# Patient Record
Sex: Female | Born: 1935
Health system: Southern US, Community
[De-identification: ages and names within clinical notes are randomized; demographics above are authoritative.]

## PROBLEM LIST (undated history)

## (undated) DIAGNOSIS — D649 Anemia, unspecified: Secondary | ICD-10-CM

## (undated) DIAGNOSIS — M7989 Other specified soft tissue disorders: Secondary | ICD-10-CM

## (undated) DIAGNOSIS — I351 Nonrheumatic aortic (valve) insufficiency: Secondary | ICD-10-CM

## (undated) DIAGNOSIS — E039 Hypothyroidism, unspecified: Secondary | ICD-10-CM

## (undated) DIAGNOSIS — M1990 Unspecified osteoarthritis, unspecified site: Secondary | ICD-10-CM

## (undated) DIAGNOSIS — E785 Hyperlipidemia, unspecified: Secondary | ICD-10-CM

## (undated) DIAGNOSIS — K5792 Diverticulitis of intestine, part unspecified, without perforation or abscess without bleeding: Secondary | ICD-10-CM

## (undated) DIAGNOSIS — K219 Gastro-esophageal reflux disease without esophagitis: Secondary | ICD-10-CM

## (undated) HISTORY — DX: Hypothyroidism, unspecified: E03.9

## (undated) HISTORY — DX: Hyperlipidemia, unspecified: E78.5

## (undated) HISTORY — DX: Other specified soft tissue disorders: M79.89

## (undated) HISTORY — DX: Gastro-esophageal reflux disease without esophagitis: K21.9

## (undated) HISTORY — PX: NO PAST SURGERIES: SHX2092

## (undated) HISTORY — PX: EYE SURGERY: SHX253

## (undated) HISTORY — DX: Diverticulitis of intestine, part unspecified, without perforation or abscess without bleeding: K57.92

## (undated) HISTORY — DX: Unspecified osteoarthritis, unspecified site: M19.90

---

## 2010-01-31 LAB — HM COLONOSCOPY: HM COLON: NORMAL

## 2010-06-01 ENCOUNTER — Inpatient Hospital Stay (INDEPENDENT_AMBULATORY_CARE_PROVIDER_SITE_OTHER)
Admission: RE | Admit: 2010-06-01 | Discharge: 2010-06-01 | Disposition: A | Discharge status: Home / Self Care | Payer: Medicare Other | Source: Ambulatory Visit | Attending: Emergency Medicine | Admitting: Emergency Medicine

## 2010-06-01 DIAGNOSIS — S139XXA Sprain of joints and ligaments of unspecified parts of neck, initial encounter: Secondary | ICD-10-CM

## 2010-06-07 ENCOUNTER — Encounter: Payer: Self-pay | Admitting: Family Medicine

## 2010-06-07 ENCOUNTER — Ambulatory Visit (INDEPENDENT_AMBULATORY_CARE_PROVIDER_SITE_OTHER): Payer: Medicare Other | Admitting: Family Medicine

## 2010-06-07 DIAGNOSIS — K219 Gastro-esophageal reflux disease without esophagitis: Secondary | ICD-10-CM

## 2010-06-07 DIAGNOSIS — E785 Hyperlipidemia, unspecified: Secondary | ICD-10-CM

## 2010-06-07 DIAGNOSIS — M810 Age-related osteoporosis without current pathological fracture: Secondary | ICD-10-CM

## 2010-06-07 DIAGNOSIS — M62838 Other muscle spasm: Secondary | ICD-10-CM

## 2010-06-07 NOTE — Progress Notes (Signed)
  Subjective:    Patient ID: Tonya Johns, female    DOB: 20-Jul-1930, 77 y.o.   MRN: [id number]  HPI New to establish care.  Moved from [place] 1 week ago.  Daughter lives locally.  Reports last CPE was may or June of 2011.  Neck strain- pt spent Monday moving and on Tuesday had severe neck pain.  Went to UC and was dx'd w/ cervical strain.  Started on NSAIDs, muscle relaxer and pain medicine.  Pt reports sxs are much improved.  GERD- was taking Prilosec as needed, sxs are well controlled w/ attention to diet.  Hyperlipidemia- pt reports she was tried on 3-4 meds w/out improvement in #s.  Not currently on meds.  Osteoporosis- was previously on Fosamax.  This was discontinued due to problems w/ teeth.  Was to be set up w/ Reclast infusion.  Had Dexa last year.  Review of Systems For ROS see HPI     Objective:   Physical Exam  Constitutional: She is oriented to person, place, and time. She appears well-developed and well-nourished. No distress.  HENT:  Head: Normocephalic and atraumatic.  Eyes: Conjunctivae and EOM are normal. Pupils are equal, round, and reactive to light.  Neck: No thyromegaly present.       + trap spasm bilaterally, good ROM  Cardiovascular: Normal rate, regular rhythm, normal heart sounds and intact distal pulses.   Pulmonary/Chest: Effort normal and breath sounds normal. No respiratory distress. She has no wheezes.  Lymphadenopathy:    She has no cervical adenopathy.  Neurological: She is alert and oriented to person, place, and time. No cranial nerve deficit.  Skin: Skin is warm and dry.          Assessment & Plan:

## 2010-06-07 NOTE — Patient Instructions (Signed)
Please schedule your complete physical in the next 4-6 weeks, don't eat before this appt Continue to take the muscle relaxer (cyclobenzaprine) for your neck stiffness Add a heating pad for pain relief Call with any questions or concerns Welcome!  We're glad to have you!!!

## 2010-06-15 DIAGNOSIS — E785 Hyperlipidemia, unspecified: Secondary | ICD-10-CM | POA: Insufficient documentation

## 2010-06-15 DIAGNOSIS — K219 Gastro-esophageal reflux disease without esophagitis: Secondary | ICD-10-CM | POA: Insufficient documentation

## 2010-06-15 DIAGNOSIS — M62838 Other muscle spasm: Secondary | ICD-10-CM | POA: Insufficient documentation

## 2010-06-15 DIAGNOSIS — M810 Age-related osteoporosis without current pathological fracture: Secondary | ICD-10-CM | POA: Insufficient documentation

## 2010-06-15 NOTE — Assessment & Plan Note (Signed)
Uncertain of pt's most recent cholesterol.  Due for CPE in June.  Will check labs at that time and determine what meds, if any, pt needs.  Pt expressed understanding and is in agreement w/ plan.

## 2010-06-15 NOTE — Assessment & Plan Note (Signed)
Well controlled w/ attention to diet and use of PPI prn.  Will follow.

## 2010-06-15 NOTE — Assessment & Plan Note (Signed)
Most likely due to strain of moving.  Continue muscle relaxer given in ER.  Heating pad prn.  Reviewed supportive care and red flags that should prompt return.  Pt expressed understanding and is in agreement w/ plan.

## 2010-06-15 NOTE — Assessment & Plan Note (Signed)
Pt reports she had DEXA last yr and was to have reclast.  Will attempt to get records prior to CPE so that we can set up for Reclast infusion.

## 2010-07-14 ENCOUNTER — Ambulatory Visit (INDEPENDENT_AMBULATORY_CARE_PROVIDER_SITE_OTHER): Payer: Medicare Other | Admitting: Family Medicine

## 2010-07-14 ENCOUNTER — Encounter: Payer: Self-pay | Admitting: Family Medicine

## 2010-07-14 DIAGNOSIS — M810 Age-related osteoporosis without current pathological fracture: Secondary | ICD-10-CM

## 2010-07-14 DIAGNOSIS — E049 Nontoxic goiter, unspecified: Secondary | ICD-10-CM

## 2010-07-14 DIAGNOSIS — E785 Hyperlipidemia, unspecified: Secondary | ICD-10-CM

## 2010-07-14 DIAGNOSIS — Z0000 Encounter for general adult medical examination without abnormal findings: Secondary | ICD-10-CM

## 2010-07-14 DIAGNOSIS — E010 Iodine-deficiency related diffuse (endemic) goiter: Secondary | ICD-10-CM

## 2010-07-14 LAB — LDL CHOLESTEROL, DIRECT: Direct LDL: 241.4 mg/dL

## 2010-07-14 LAB — CBC WITH DIFFERENTIAL/PLATELET
Eosinophils Relative: 1.3 % (ref 0.0–5.0)
Monocytes Absolute: 0.5 10*3/uL (ref 0.1–1.0)
Monocytes Relative: 7.2 % (ref 3.0–12.0)
Neutrophils Relative %: 58.5 % (ref 43.0–77.0)
Platelets: 241 10*3/uL (ref 150.0–400.0)
WBC: 6.3 10*3/uL (ref 4.5–10.5)

## 2010-07-14 LAB — LIPID PANEL
HDL: 63.3 mg/dL (ref >39.00)
Total CHOL/HDL Ratio: 5
Triglycerides: 101 mg/dL (ref 0.0–149.0)
VLDL: 20.2 mg/dL (ref 0.0–40.0)

## 2010-07-14 LAB — HEPATIC FUNCTION PANEL
Albumin: 3.3 g/dL — ABNORMAL LOW (ref 3.5–5.2)
Alkaline Phosphatase: 86 U/L (ref 39–117)
Total Protein: 7 g/dL (ref 6.0–8.3)

## 2010-07-14 LAB — BASIC METABOLIC PANEL
BUN: 23 mg/dL (ref 6–23)
CO2: 27 mEq/L (ref 19–32)
Calcium: 8.8 mg/dL (ref 8.4–10.5)
Creatinine, Ser: 1.3 mg/dL — ABNORMAL HIGH (ref 0.4–1.2)
GFR: 52.8 mL/min — ABNORMAL LOW (ref >60.00)
Glucose, Bld: 113 mg/dL — ABNORMAL HIGH (ref 70–99)

## 2010-07-14 LAB — TSH: TSH: 0.92 u[IU]/mL (ref 0.35–5.50)

## 2010-07-14 NOTE — Patient Instructions (Signed)
Your exam looks great!  Keep up the good work! We'll notify you of your lab results and your Reclast info Call with any questions or concerns Have a great summer!

## 2010-07-14 NOTE — Progress Notes (Signed)
  Subjective:    Patient ID: Tonya Johns, female    DOB: January 14, 1931, 77 y.o.   MRN: [id number]  HPI Here today for CPE.  Risk Factors: Hyperlipidemia- chronic problem for pt, reports she was tried on 2 different meds (Simvastatin and Lipitor) and both caused nocturnal cough and 'feeling different'.  sxs improved when she stopped meds. Osteoporosis- chronic problem for pt, was taking Fosamax but had difficulty w/ dental problems.  Previous MD was going to start Reclast. Physical Activity: working in yard regularly w/out difficulty Fall Risk: low risk Depression: denies sxs of depression, retired in April from [employer] Hearing: denies concern, normal to whispered voice at 6 ft ADL's: independent Cognitive: normal linear thought process, no memory deficits. Home Safety: safe at home, lives w/ son Height, Weight, BMI, Visual Acuity: see vitals, vision corrected to 20/20 w/ glasses Counseling: due next year for colonoscopy, UTD on mammo.  Declines pap smear.  Discussed importance of power of attorney, and living will. Labs Ordered: See A&P Care Plan: See A&P    Review of Systems ROS  Patient reports no vision/ hearing changes, adenopathy,fever, weight change,  persistant/recurrent hoarseness , swallowing issues, chest pain, palpitations, edema, persistant/recurrent cough, hemoptysis, dyspnea (rest/exertional/paroxysmal nocturnal), gastrointestinal bleeding (melena, rectal bleeding), abdominal pain, significant heartburn, bowel changes, GU symptoms (dysuria, hematuria, incontinence), Gyn symptoms (abnormal  bleeding, pain),  syncope, focal weakness, memory loss, numbness & tingling, skin/hair/nail changes, abnormal bruising or bleeding, anxiety, or depression.     Objective:   Physical Exam  General Appearance:    Alert, cooperative, no distress, appears stated age  Head:    Normocephalic, without obvious abnormality, atraumatic  Eyes:    PERRL, conjunctiva/corneas  clear, EOM's intact, fundi    benign, both eyes  Ears:    Normal TM's and external ear canals, both ears  Nose:   Nares normal, septum midline, mucosa normal, no drainage    or sinus tenderness  Throat:   Lips, mucosa, and tongue normal; teeth and gums normal  Neck:   Supple, symmetrical, trachea midline, no adenopathy;    Thyroid: diffuse enlargement w/out obvious nodule  Back:     Symmetric, no curvature, ROM normal, no CVA tenderness  Lungs:     Clear to auscultation bilaterally, respirations unlabored  Chest Wall:    No tenderness or deformity   Heart:    Regular rate and rhythm, S1 and S2 normal  Breast Exam:    No tenderness, masses, or nipple abnormality  Abdomen:     Soft, non-tender, bowel sounds active all four quadrants,    no masses, no organomegaly  Genitalia:    Deferred  Rectal:    Deferred  Extremities:   Extremities normal, atraumatic, no cyanosis or edema  Pulses:   2+ and symmetric all extremities  Skin:   Skin color, texture, turgor normal, no rashes or lesions  Lymph nodes:   Cervical, supraclavicular, and axillary nodes normal  Neurologic:   CNII-XII intact, normal strength, sensation and reflexes    throughout          Assessment & Plan:

## 2010-07-27 ENCOUNTER — Telehealth: Payer: Self-pay | Admitting: *Deleted

## 2010-07-27 NOTE — Assessment & Plan Note (Signed)
Pt's PE WNL w/ exception of thyromegaly.  UTD on health maintenance.  Check labs.  Anticipatory guidance provided.

## 2010-07-27 NOTE — Assessment & Plan Note (Signed)
Check labs- determine starting point and pick appropriate med.

## 2010-07-27 NOTE — Telephone Encounter (Signed)
Left message on voicemail to call the office.  **Pt Reclast has been approved and appt is sch for Monday 08/02/10 10AM at [facility].

## 2010-07-27 NOTE — Assessment & Plan Note (Signed)
Check Vit D level and set up Reclast for pt if labs are appropriate.

## 2010-07-27 NOTE — Assessment & Plan Note (Signed)
This is not a new problem for pt.  Was having routine US's to assess.  Will need to review records and determine when f/u is needed.

## 2010-07-28 ENCOUNTER — Encounter: Payer: Self-pay | Admitting: *Deleted

## 2010-07-28 NOTE — Telephone Encounter (Signed)
Pt.notified

## 2010-07-28 NOTE — Telephone Encounter (Signed)
Left message on voicemail to call the office

## 2010-08-02 ENCOUNTER — Other Ambulatory Visit: Payer: Self-pay | Admitting: Family Medicine

## 2010-08-02 ENCOUNTER — Ambulatory Visit (HOSPITAL_COMMUNITY): Payer: Medicare Other | Attending: Family Medicine

## 2010-08-02 DIAGNOSIS — M810 Age-related osteoporosis without current pathological fracture: Secondary | ICD-10-CM | POA: Insufficient documentation

## 2010-08-02 LAB — CREATININE, SERUM: Creatinine, Ser: 1.09 mg/dL (ref 0.50–1.10)

## 2010-09-10 ENCOUNTER — Other Ambulatory Visit: Payer: Self-pay | Admitting: Family Medicine

## 2010-09-10 DIAGNOSIS — Z0000 Encounter for general adult medical examination without abnormal findings: Secondary | ICD-10-CM

## 2010-09-13 ENCOUNTER — Other Ambulatory Visit (INDEPENDENT_AMBULATORY_CARE_PROVIDER_SITE_OTHER): Payer: Medicare Other

## 2010-09-13 DIAGNOSIS — Z0000 Encounter for general adult medical examination without abnormal findings: Secondary | ICD-10-CM

## 2010-09-13 DIAGNOSIS — Z0289 Encounter for other administrative examinations: Secondary | ICD-10-CM

## 2010-09-13 NOTE — Progress Notes (Signed)
Labs only

## 2010-09-21 ENCOUNTER — Other Ambulatory Visit: Payer: Self-pay | Admitting: *Deleted

## 2010-09-21 MED ORDER — PITAVASTATIN CALCIUM 4 MG PO TABS: 1.0000 | ORAL_TABLET | Freq: Once | ORAL | Status: DC

## 2010-09-28 ENCOUNTER — Other Ambulatory Visit: Payer: Self-pay | Admitting: Family Medicine

## 2010-09-28 DIAGNOSIS — E785 Hyperlipidemia, unspecified: Secondary | ICD-10-CM

## 2010-09-29 ENCOUNTER — Telehealth: Payer: Self-pay | Admitting: Family Medicine

## 2010-09-29 ENCOUNTER — Other Ambulatory Visit (INDEPENDENT_AMBULATORY_CARE_PROVIDER_SITE_OTHER): Payer: Medicare Other

## 2010-09-29 ENCOUNTER — Encounter: Payer: Self-pay | Admitting: *Deleted

## 2010-09-29 DIAGNOSIS — E785 Hyperlipidemia, unspecified: Secondary | ICD-10-CM

## 2010-09-29 LAB — HEPATIC FUNCTION PANEL
ALT: 15 U/L (ref 0–35)
AST: 17 U/L (ref 0–37)
Total Bilirubin: 0.4 mg/dL (ref 0.3–1.2)
Total Protein: 7 g/dL (ref 6.0–8.3)

## 2010-09-29 NOTE — Telephone Encounter (Signed)
Discuss with patient  

## 2010-09-29 NOTE — Progress Notes (Signed)
Labs only

## 2010-09-29 NOTE — Telephone Encounter (Signed)
This is not a side effect I'm familiar w/ but it is possible.  The best thing to do would be to stop taking it for a week and see if her symptoms stop.  She should call us back after 1 week and let us know.

## 2010-11-25 ENCOUNTER — Encounter: Payer: Self-pay | Admitting: Family Medicine

## 2010-11-25 ENCOUNTER — Ambulatory Visit (INDEPENDENT_AMBULATORY_CARE_PROVIDER_SITE_OTHER): Payer: Medicare Other | Admitting: Family Medicine

## 2010-11-25 DIAGNOSIS — F329 Major depressive disorder, single episode, unspecified: Secondary | ICD-10-CM

## 2010-11-25 DIAGNOSIS — M25562 Pain in left knee: Secondary | ICD-10-CM | POA: Insufficient documentation

## 2010-11-25 DIAGNOSIS — E0590 Thyrotoxicosis, unspecified without thyrotoxic crisis or storm: Secondary | ICD-10-CM

## 2010-11-25 DIAGNOSIS — M25569 Pain in unspecified knee: Secondary | ICD-10-CM

## 2010-11-25 DIAGNOSIS — Z23 Encounter for immunization: Secondary | ICD-10-CM

## 2010-11-25 DIAGNOSIS — R5383 Other fatigue: Secondary | ICD-10-CM

## 2010-11-25 DIAGNOSIS — R011 Cardiac murmur, unspecified: Secondary | ICD-10-CM

## 2010-11-25 DIAGNOSIS — M25561 Pain in right knee: Secondary | ICD-10-CM

## 2010-11-25 DIAGNOSIS — F32A Depression, unspecified: Secondary | ICD-10-CM | POA: Insufficient documentation

## 2010-11-25 LAB — CBC WITH DIFFERENTIAL/PLATELET
Basophils Relative: 0.3 % (ref 0.0–3.0)
Eosinophils Absolute: 0 10*3/uL (ref 0.0–0.7)
Eosinophils Relative: 0.4 % (ref 0.0–5.0)
Lymphocytes Relative: 24.9 % (ref 12.0–46.0)
MCHC: 33.6 g/dL (ref 30.0–36.0)
MCV: 89.8 fl (ref 78.0–100.0)
Monocytes Absolute: 0.5 10*3/uL (ref 0.1–1.0)
Neutrophils Relative %: 66.8 % (ref 43.0–77.0)
Platelets: 217 10*3/uL (ref 150.0–400.0)
RBC: 3.35 Mil/uL — ABNORMAL LOW (ref 3.87–5.11)
WBC: 6.2 10*3/uL (ref 4.5–10.5)

## 2010-11-25 LAB — BASIC METABOLIC PANEL
Chloride: 109 mEq/L (ref 96–112)
GFR: 66.56 mL/min (ref >60.00)
Potassium: 4.4 mEq/L (ref 3.5–5.1)
Sodium: 143 mEq/L (ref 135–145)

## 2010-11-25 LAB — TSH: TSH: 0.01 u[IU]/mL — ABNORMAL LOW (ref 0.35–5.50)

## 2010-11-25 MED ORDER — SERTRALINE HCL 50 MG PO TABS: 50.0000 mg | ORAL_TABLET | Freq: Every day | ORAL | Status: DC

## 2010-11-25 NOTE — Progress Notes (Signed)
  Subjective:    Patient ID: Tonya Johns, female    DOB: 06-Nov-1930, 77 y.o.   MRN: [id number]  HPI Knees 'giving out'- R>L, reports this painful- 'for awhile'.  Yesterday had leg weakness and fell.  1st fall for pt.  No dizziness.    Fatigue- reports she has had all her teeth pulled and is unable to eat properly.  Drinking ensure but has 'no energy'.  Daughter reports decreased appetite.    ? Depression- reports she is unable to fall asleep.  'i used to go to sleep every day at 9, now there are some nights i can't sleep at all'.  sxs for 'a couple of months'.  Admits to depression.  Reports there is some family discourse.   Review of Systems For ROS see HPI     Objective:   Physical Exam  Vitals reviewed. Constitutional: She is oriented to person, place, and time. She appears well-developed and well-nourished. No distress.       Appears tired and withdrawn  HENT:  Head: Normocephalic and atraumatic.  Eyes: Conjunctivae and EOM are normal. Pupils are equal, round, and reactive to light.  Neck: Normal range of motion. Neck supple. Thyromegaly present.  Cardiovascular: Normal rate, regular rhythm and intact distal pulses.   Murmur (II/VI SEM) heard. Pulmonary/Chest: Breath sounds normal. No respiratory distress. She has no wheezes. She has no rales.  Musculoskeletal: She exhibits tenderness (joint line tenderness of knees bilaterally). She exhibits no edema.  Lymphadenopathy:    She has no cervical adenopathy.  Neurological: She is alert and oriented to person, place, and time. No cranial nerve deficit.          Assessment & Plan:

## 2010-11-25 NOTE — Patient Instructions (Signed)
Follow up in 1 month to recheck mood Start the Zoloft daily Someone will call you with your orthopedic appt We'll notify you of your lab results Try and make sure you are eating regularly Call with any questions or concerns Hang in there!

## 2010-11-26 ENCOUNTER — Telehealth: Payer: Self-pay | Admitting: *Deleted

## 2010-11-26 LAB — HEMOGLOBIN A1C: Hgb A1c MFr Bld: 6.3 % (ref 4.6–6.5)

## 2010-11-26 MED ORDER — OXYCODONE-ACETAMINOPHEN 5-325 MG PO TABS: 1.0000 | ORAL_TABLET | Freq: Four times a day (QID) | ORAL | Status: DC | PRN

## 2010-11-26 NOTE — Telephone Encounter (Signed)
Pt  Called in requesting something for knee pain. Per dr Beverely Low refill oxycodone, Pt aware

## 2010-11-29 LAB — T4, FREE: Free T4: 4.01 ng/dL — ABNORMAL HIGH (ref 0.60–1.60)

## 2010-11-30 ENCOUNTER — Telehealth: Payer: Self-pay | Admitting: Family Medicine

## 2010-11-30 NOTE — Assessment & Plan Note (Signed)
Murmur louder than previously.  Get ECHO to assess.  R/o aortic stenosis as contributing factor in fatigue.

## 2010-11-30 NOTE — Telephone Encounter (Signed)
In reference to 2D Echo WITH Contrast (cpt 93351), not enough clinical information to meet requirements for approval at Clinical level of Medicare Complete.  Case #[id number], has been forwarded to Physician review (turn-around time 2 business days).  If you wish to speak with their physician, call [phone], Option 4, and use above Case#.

## 2010-11-30 NOTE — Assessment & Plan Note (Signed)
Pt has not seen ortho for this issue.  Pain is worsening and pt has fallen b/c of it.  Will refer for complete evaluation and tx- refill provided on vicodin.

## 2010-11-30 NOTE — Assessment & Plan Note (Signed)
Likely multifactorial- depression, poor oral intake due to dental surgery, pain.  SEM sounds louder than previous.  Must r/o aortic stenosis as contributing factor.  Check labs.  Get ECHO.  Will follow closely.

## 2010-11-30 NOTE — Assessment & Plan Note (Signed)
New.  Pt admits to depressive sxs.  Will start low dose SSRI and monitor sxs closely.  Daughter was w/ pt at appt- aware of sxs.  Pt denies SI/HI.

## 2010-12-01 ENCOUNTER — Telehealth: Payer: Self-pay | Admitting: *Deleted

## 2010-12-01 NOTE — Telephone Encounter (Signed)
Called pt advised results and instructions. Pt understood and referal order sent for endroconology per hyperthyroidism noted. Pt understood.

## 2010-12-01 NOTE — Progress Notes (Signed)
Addended by: Derry Lory A on: 12/01/2010 06:00 PM   Modules accepted: Orders

## 2010-12-02 NOTE — Telephone Encounter (Signed)
Per my call to confirm, per patient's insurance, this Case was denied.  Will schedule Cards Consult.

## 2010-12-02 NOTE — Progress Notes (Signed)
Addended by: Sheliah Hatch on: 12/02/2010 08:33 AM   Modules accepted: Orders

## 2010-12-02 NOTE — Telephone Encounter (Signed)
Cards consult entered.

## 2010-12-10 ENCOUNTER — Other Ambulatory Visit (INDEPENDENT_AMBULATORY_CARE_PROVIDER_SITE_OTHER): Payer: Medicare Other

## 2010-12-10 ENCOUNTER — Ambulatory Visit (INDEPENDENT_AMBULATORY_CARE_PROVIDER_SITE_OTHER): Payer: Medicare Other | Admitting: Endocrinology

## 2010-12-10 DIAGNOSIS — E0590 Thyrotoxicosis, unspecified without thyrotoxic crisis or storm: Secondary | ICD-10-CM

## 2010-12-10 DIAGNOSIS — E042 Nontoxic multinodular goiter: Secondary | ICD-10-CM

## 2010-12-10 LAB — TSH: TSH: 0.02 u[IU]/mL — ABNORMAL LOW (ref 0.35–5.50)

## 2010-12-10 NOTE — Patient Instructions (Addendum)
blood tests are being requested for you today.  please call [phone] to hear your test results.  You will be prompted to enter the 9-digit "MRN" number that appears at the top left of this page, followed by #.  Then you will hear the message. If the thyroid is still overactive: let's check a thyroid "scan" (a special, but easy and painless type of thyroid x ray).  It works like this: you go to the x-ray department of the hospital to swallow a pill, which contains a miniscule amount of radiation.  You will not notice any symptoms from this.  You will go back to the x-ray department the next day, to lie down in front of a camera.  The results of this will be sent to me.  please call [phone] to hear your test results.  You will be prompted to enter the 9-digit "MRN" number that appears at the top left of this page, followed by #.  Then you will hear the message. Based on the results, i hope to order for you a treatment pill of radioactive iodine.  Although it is a larger amount of radiation, you will again notice no symptoms from this.  The pill is gone from your body in a few days (during which you should stay away from other people), but takes several months to work.  Therefore, please return here approximately 6-8 weeks after the treatment.  This treatment has been available for many years, and the only known side-effect is an underactive thyroid.  It is possible that i would eventually prescribe for you a thyroid hormone pill, which is very inexpensive.  You don't have to worry about side-effects of this thyroid hormone pill, because it is the same molecule your thyroid makes. Another option is the daily medication to slow down the thyroid down, while the radioactive iodine is working.  Let me know if you want this.   (update: i left message on phone-tree:  Thyroid is still high.  Call with your rx choice)

## 2010-12-10 NOTE — Progress Notes (Signed)
Subjective:    Patient ID: Tonya Johns, female    DOB: 1931-01-13, 77 y.o.   MRN: [id number]  HPI Pt states 1 month of moderate tremor of the hands, and assoc depression.  tsh had been normal just 4 mos earlier.  Pt is unaware of any thyroid problem.   On further questioning, pt says she had been having annual ultrasounds, for a goiter, but has never been on thyroid medication.   Past Medical History  Diagnosis Date  . Arthritis   . Diverticulitis   . GERD (gastroesophageal reflux disease)   . Hyperlipidemia   . Osteoporosis     No past surgical history on file.  History   Social History  . Marital Status: Single    Spouse Name: N/A    Number of Children: N/A  . Years of Education: N/A   Occupational History  . Not on file.   Social History Main Topics  . Smoking status: Never Smoker   . Smokeless tobacco: Not on file  . Alcohol Use: No  . Drug Use: No  . Sexually Active:    Other Topics Concern  . Not on file   Social History Narrative  . No narrative on file    Current Outpatient Prescriptions on File Prior to Visit  Medication Sig Dispense Refill  . CALCIUM PO Take by mouth daily.        Marland Kitchen CLINDAMYCIN HCL PO Take by mouth 3 (three) times daily. Pt does not know the dose.       . cyclobenzaprine (FLEXERIL) 5 MG tablet Take 5 mg by mouth 3 (three) times daily as needed.        . ergocalciferol (VITAMIN D2) 50000 UNITS capsule Take 50,000 Units by mouth once a week.        . IBUPROFEN PO Take by mouth daily.        . meloxicam (MOBIC) 7.5 MG tablet Take 7.5 mg by mouth daily.        . mometasone (NASONEX) 50 MCG/ACT nasal spray 2 sprays by Nasal route daily.        Marland Kitchen oxyCODONE-acetaminophen (PERCOCET) 5-325 MG per tablet Take 1 tablet by mouth every 6 (six) hours as needed.  30 tablet  0  . Pitavastatin Calcium (LIVALO) 4 MG TABS Take 1 tablet (4 mg total) by mouth once.  30 tablet  2  . sertraline (ZOLOFT) 50 MG tablet Take 1 tablet (50 mg total) by mouth daily.   30 tablet  2    Allergies  Allergen Reactions  . Penicillins Rash    Family History  Problem Relation Age of Onset  . Alcohol abuse Brother   . Alcohol abuse Sister   . Arthritis Mother   . Arthritis Father   . Hyperlipidemia Brother   . Hyperlipidemia Sister   . Heart disease Father   . Stroke Mother   . Hypertension Mother   . Sudden death Brother   . Mental illness Sister   . Diabetes Sister   mother had uncertain type of thyroid problem .  dtr has thyroid lobectomy, dye to a goiter (benign).    BP 140/58  Pulse 80  Temp(Src) 98.4 F (36.9 C) (Oral)  Ht 5\' 4"  (1.626 m)  Wt 177 lb 0.6 oz (80.305 kg)  BMI 30.39 kg/m2  SpO2 94%    Review of Systems He reports muscle weakness, hoarseness, easy bruising, and fatigue.  denies headache, double vision, chest pain, sob, diarrhea, polyuria, excessive diaphoresis, numbness,  seizure, anxiety, hypoglycemia, and rhinorrhea.  She attributes weight los to dental problems.      Objective:   Physical Exam VS: see vs page GEN: no distress HEAD: head: no deformity eyes: no periorbital swelling, no proptosis external nose and ears are normal mouth: no lesion seen NECK: supple, thyroid is not enlarged on the left, but it is slightly enlarged on the right.  i can't tell details.   CHEST WALL: no deformity LUNGS:  Clear to auscultation CV: reg rate and rhythm, no murmur ABD: abdomen is soft, nontender.  no hepatosplenomegaly.  not distended.  no hernia. MUSCULOSKELETAL: muscle bulk and strength are grossly normal.  no obvious joint swelling.  gait is normal and steady EXTEMITIES: no deformity.  no ulcer on the feet.  feet are of normal color and temp.  no edema PULSES: dorsalis pedis intact bilat.  no carotid bruit NEURO:  cn 2-12 grossly intact.   readily moves all 4's.  sensation is intact to touch on the feet.  there is a moderate tremor of the hands.   SKIN:  Normal texture and temperature.  No rash or suspicious lesion is  visible.   NODES:  None palpable at the neck PSYCH: alert, oriented x3.  Does not appear anxious nor depressed.    (i reviewed 04/27/10 thyroid ultrasound report).   Lab Results  Component Value Date   TSH 0.01* 11/25/2010  today: Lab Results  Component Value Date   TSH 0.02* 12/10/2010      Assessment & Plan:  multinodular goiter, which is usually hereditary. Hyperthyroidism.  [place] and fhx suggest multinodular goiter is the cause, but relatively sudden onset is more typical of grave's dz.  She may have 2 diseases Depression.  This limits interpretation of sxs Osteoporosis.  She has a high risk of worsening due to hyperthyroidism.

## 2010-12-15 ENCOUNTER — Encounter: Payer: Self-pay | Admitting: Cardiovascular Disease

## 2010-12-15 ENCOUNTER — Ambulatory Visit (INDEPENDENT_AMBULATORY_CARE_PROVIDER_SITE_OTHER): Payer: Medicare Other | Admitting: Cardiovascular Disease

## 2010-12-15 ENCOUNTER — Telehealth: Payer: Self-pay | Admitting: *Deleted

## 2010-12-15 VITALS — BP 139/67 | HR 70 | Ht 64.0 in | Wt 175.4 lb

## 2010-12-15 DIAGNOSIS — E0590 Thyrotoxicosis, unspecified without thyrotoxic crisis or storm: Secondary | ICD-10-CM

## 2010-12-15 DIAGNOSIS — R011 Cardiac murmur, unspecified: Secondary | ICD-10-CM

## 2010-12-15 NOTE — Progress Notes (Signed)
[name] Date of Birth  29-Jun-1930 [facility] [address]    [address] [place], [place]  [address] [phone]  Fax  [phone]  History of Present Illness:  Mrs. Dain is a 77 year old female who we are asked to see today because of some leg swelling.  She complains of some generalized fatigue. She denies any episodes of chest pain.  She denies any dyspnea.  She exercises on a regular basis and goes to the silver sneakers.  He was recently found to be hyperthyroid. She was in Dr. Neena Rhymes, MD office and she ordered an echocardiogram.  An order for stress echocardiogram was apparently  placed and the insurance  denied coverage.  She was sent to office for further evaluation.  Current Outpatient Prescriptions on File Prior to Visit  Medication Sig Dispense Refill  . Pitavastatin Calcium (LIVALO) 4 MG TABS Take 1 tablet (4 mg total) by mouth once.  30 tablet  2  . sertraline (ZOLOFT) 50 MG tablet Take 1 tablet (50 mg total) by mouth daily.  30 tablet  2    Allergies  Allergen Reactions  . Penicillins Rash    Past Medical History  Diagnosis Date  . Arthritis   . Diverticulitis   . GERD (gastroesophageal reflux disease)   . Hyperlipidemia   . Osteoporosis   . Leg swelling     No past surgical history on file.  History  Smoking status  . Never Smoker   Smokeless tobacco  . Not on file    History  Alcohol Use No    Family History  Problem Relation Age of Onset  . Alcohol abuse Brother   . Alcohol abuse Sister   . Arthritis Mother   . Arthritis Father   . Hyperlipidemia Brother   . Hyperlipidemia Sister   . Heart disease Father   . Stroke Mother   . Hypertension Mother   . Sudden death Brother   . Mental illness Sister   . Diabetes Sister     Reviw of Systems:  Reviewed in the HPI.  All other systems are negative.  Physical Exam: BP 139/67  Pulse 70  Ht 5\' 4"  (1.626 m)  Wt 175 lb 6.4 oz (79.561 kg)  BMI 30.11 kg/m2 The patient  is alert and oriented x 3.  The mood and affect are normal.   Skin: warm and dry.  Color is normal.    HEENT:   Her carotid impulses are fairly prominent. She has no bruits. There is no JVD. She does have a V wave consistent with tricuspid regurgitation. Her neck is supple.  Her mucous membranes are moist.  I could not feel any thyromegaly.    Lungs: Lungs are clear.   Heart: Shows a regular rate. Normal S1-S2. Her PMI is slightly hyperdynamic. She has a 1-2/6 systolic ejection murmur.    Abdomen: Abdomen soft. She has good bowel sounds.  Extremities:  No clubbing cyanosis or edema. No palpable cords.  Neuro:  Her gait is normal. Her neuro exam is nonfocal.    ECG: EKG reveals normal sinus rhythm. She has  Assessment / Plan:

## 2010-12-15 NOTE — Telephone Encounter (Signed)
i ordered

## 2010-12-15 NOTE — Assessment & Plan Note (Signed)
Mrs. Tonya Johns has been fairly hyperdynamic heart which I think is related her hyperthyroidism. She does have a soft murmur that I think is related to tricuspid regurgitation. She has very prominent carotid upstrokes and good distal pulses so I do not think that she has significant aortic stenosis.  I agree that an echocardiogram is needed for further evaluation. I do not think that a stress echocardiogram is indicated at this time.  I'll see Tonya Johns again on as-needed basis. If her echocardiogram shows any significant problems then I will see her on a regular basis.

## 2010-12-15 NOTE — Telephone Encounter (Signed)
Pt is willing to proceed with tx for thyroid and wants thyroid scan scheduled. (Preferrably after Thanksgiving holiday).

## 2010-12-15 NOTE — Patient Instructions (Addendum)
Your physician recommends that you schedule a follow-up appointment in: AS NEEDED BASIS,  Your physician has requested that you have an echocardiogram. Echocardiography is a painless test that uses sound waves to create images of your heart. It provides your doctor with information about the size and shape of your heart and how well your heart's chambers and valves are working. This procedure takes approximately one hour. There are no restrictions for this procedure.

## 2010-12-16 NOTE — Telephone Encounter (Signed)
Pt informed

## 2010-12-17 ENCOUNTER — Other Ambulatory Visit (INDEPENDENT_AMBULATORY_CARE_PROVIDER_SITE_OTHER): Payer: Medicare Other

## 2010-12-17 DIAGNOSIS — Z1211 Encounter for screening for malignant neoplasm of colon: Secondary | ICD-10-CM

## 2010-12-17 LAB — HEMOCCULT GUIAC POC 1CARD (OFFICE): Card #3 Fecal Occult Blood, POC: NEGATIVE

## 2010-12-17 NOTE — Progress Notes (Signed)
12  

## 2010-12-22 ENCOUNTER — Ambulatory Visit (HOSPITAL_COMMUNITY): Payer: Medicare Other | Attending: Cardiology | Admitting: Radiology

## 2010-12-22 DIAGNOSIS — I359 Nonrheumatic aortic valve disorder, unspecified: Secondary | ICD-10-CM | POA: Insufficient documentation

## 2010-12-22 DIAGNOSIS — I379 Nonrheumatic pulmonary valve disorder, unspecified: Secondary | ICD-10-CM | POA: Insufficient documentation

## 2010-12-22 DIAGNOSIS — R011 Cardiac murmur, unspecified: Secondary | ICD-10-CM | POA: Insufficient documentation

## 2010-12-22 DIAGNOSIS — I079 Rheumatic tricuspid valve disease, unspecified: Secondary | ICD-10-CM | POA: Insufficient documentation

## 2010-12-27 ENCOUNTER — Encounter: Payer: Self-pay | Admitting: Family Medicine

## 2010-12-27 ENCOUNTER — Ambulatory Visit (INDEPENDENT_AMBULATORY_CARE_PROVIDER_SITE_OTHER): Payer: Medicare Other | Admitting: Family Medicine

## 2010-12-27 DIAGNOSIS — M62838 Other muscle spasm: Secondary | ICD-10-CM

## 2010-12-27 DIAGNOSIS — M25569 Pain in unspecified knee: Secondary | ICD-10-CM

## 2010-12-27 DIAGNOSIS — F329 Major depressive disorder, single episode, unspecified: Secondary | ICD-10-CM

## 2010-12-27 DIAGNOSIS — M25562 Pain in left knee: Secondary | ICD-10-CM

## 2010-12-27 MED ORDER — IBUPROFEN 600 MG PO TABS: 600.0000 mg | ORAL_TABLET | Freq: Four times a day (QID) | ORAL | Status: DC | PRN

## 2010-12-27 MED ORDER — CYCLOBENZAPRINE HCL 5 MG PO TABS: 5.0000 mg | ORAL_TABLET | Freq: Three times a day (TID) | ORAL | Status: DC | PRN

## 2010-12-27 NOTE — Progress Notes (Signed)
  Subjective:    Patient ID: Tonya Johns, female    DOB: September 10, 1930, 77 y.o.   MRN: [id number]  HPI Knee pain- now getting injections w/ ortho, pain is improving.  Shoulder pain- now having shoulder pain bilaterally.  Reports hx of arthritis.  Wants NSAID script.  Feels shoulder and neck are frequently 'tight', causing pain.  Pain will radiate into neck and down into upper arm.  Denies weakness.  Depression- mood has improved since starting Zoloft.  Still having insomnia.  Has upcoming tx scheduled for radioactive iodine to address hyperthyroid.  Aware that her insomnia might stem from this.   Review of Systems For ROS see HPI     Objective:   Physical Exam  Vitals reviewed. Constitutional: She is oriented to person, place, and time. She appears well-developed and well-nourished.  Musculoskeletal:       Bilateral trap spasm Full ROM of shoulders w/out joint crepitus or TTP  Neurological: She is alert and oriented to person, place, and time. No cranial nerve deficit. Coordination normal.  Skin: Skin is warm and dry.  Psychiatric: She has a normal mood and affect. Her behavior is normal. Judgment and thought content normal.          Assessment & Plan:

## 2010-12-27 NOTE — Patient Instructions (Signed)
Follow up in 2 months to recheck cholesterol- don't eat before this appt Take the ibuprofen as needed for shoulder, neck, and knee pain Use the muscle relaxers at night to help w/ pain and sleep (flexeril) I think the insomnia is due to the thyroid and this should get better I'm so glad the mood has improved Happy Holidays!!!

## 2011-01-02 NOTE — Assessment & Plan Note (Signed)
Improved since starting Zoloft.  Continue at current dose.

## 2011-01-02 NOTE — Assessment & Plan Note (Addendum)
Now getting injxns from ortho.  If this does not improve her sxs they will discuss surgery.

## 2011-01-02 NOTE — Assessment & Plan Note (Signed)
This is likely cause of pt's neck and shoulder pain rather than arthritis.  Start NSAIDs and flexeril.  Heating pad prn.  Reviewed supportive care and red flags that should prompt return.  Pt expressed understanding and is in agreement w/ plan.

## 2011-01-10 ENCOUNTER — Encounter (HOSPITAL_COMMUNITY)
Admission: RE | Admit: 2011-01-10 | Discharge: 2011-01-10 | Disposition: A | Discharge status: Home / Self Care | Payer: Medicare Other | Source: Ambulatory Visit | Attending: Endocrinology | Admitting: Endocrinology

## 2011-01-10 DIAGNOSIS — E0590 Thyrotoxicosis, unspecified without thyrotoxic crisis or storm: Secondary | ICD-10-CM | POA: Insufficient documentation

## 2011-01-11 ENCOUNTER — Other Ambulatory Visit: Payer: Self-pay | Admitting: Endocrinology

## 2011-01-11 ENCOUNTER — Encounter (HOSPITAL_COMMUNITY)
Admission: RE | Admit: 2011-01-11 | Discharge: 2011-01-11 | Disposition: A | Discharge status: Home / Self Care | Payer: Medicare Other | Source: Ambulatory Visit | Attending: Endocrinology | Admitting: Endocrinology

## 2011-01-11 DIAGNOSIS — E0590 Thyrotoxicosis, unspecified without thyrotoxic crisis or storm: Secondary | ICD-10-CM

## 2011-01-11 MED ORDER — SODIUM PERTECHNETATE TC 99M INJECTION
10.0000 | Freq: Once | INTRAVENOUS | Status: AC | PRN
  Administered 2011-01-11: 10 via INTRAVENOUS

## 2011-01-11 MED ORDER — SODIUM IODIDE I 131 CAPSULE
9.7000 | Freq: Once | INTRAVENOUS | Status: AC | PRN
  Administered 2011-01-10: 9.7 via ORAL

## 2011-01-21 ENCOUNTER — Encounter (HOSPITAL_COMMUNITY)
Admission: RE | Admit: 2011-01-21 | Discharge: 2011-01-21 | Disposition: A | Discharge status: Home / Self Care | Payer: Medicare Other | Source: Ambulatory Visit | Attending: Endocrinology | Admitting: Endocrinology

## 2011-01-21 DIAGNOSIS — E0590 Thyrotoxicosis, unspecified without thyrotoxic crisis or storm: Secondary | ICD-10-CM

## 2011-01-21 MED ORDER — SODIUM IODIDE I 131 CAPSULE
9.2500 | Freq: Once | INTRAVENOUS | Status: AC | PRN
  Administered 2011-01-21: 9.25 via ORAL

## 2011-03-29 ENCOUNTER — Telehealth: Payer: Self-pay | Admitting: Family Medicine

## 2011-03-29 MED ORDER — SERTRALINE HCL 50 MG PO TABS: 50.0000 mg | ORAL_TABLET | Freq: Every day | ORAL | Status: DC

## 2011-03-29 NOTE — Telephone Encounter (Signed)
Refill: Sertraline hcl 50 mg tablet. Take 1 tablet by mouth daily. Qty 30. Last fill 02-24-11

## 2011-03-29 NOTE — Telephone Encounter (Signed)
rx sent to pharmacy by e-script  

## 2011-04-07 ENCOUNTER — Encounter: Payer: Self-pay | Admitting: Family Medicine

## 2011-04-07 ENCOUNTER — Ambulatory Visit (INDEPENDENT_AMBULATORY_CARE_PROVIDER_SITE_OTHER): Payer: Medicare Other | Admitting: Family Medicine

## 2011-04-07 ENCOUNTER — Telehealth: Payer: Self-pay | Admitting: Family Medicine

## 2011-04-07 VITALS — BP 125/70 | HR 64 | Temp 98.6°F | Ht 63.75 in | Wt 181.6 lb

## 2011-04-07 DIAGNOSIS — M62838 Other muscle spasm: Secondary | ICD-10-CM

## 2011-04-07 DIAGNOSIS — R7309 Other abnormal glucose: Secondary | ICD-10-CM | POA: Insufficient documentation

## 2011-04-07 DIAGNOSIS — E785 Hyperlipidemia, unspecified: Secondary | ICD-10-CM

## 2011-04-07 LAB — LIPID PANEL
HDL: 78.8 mg/dL (ref >39.00)
VLDL: 30.4 mg/dL (ref 0.0–40.0)

## 2011-04-07 LAB — BASIC METABOLIC PANEL
CO2: 27 mEq/L (ref 19–32)
Calcium: 9.5 mg/dL (ref 8.4–10.5)
GFR: 50.38 mL/min — ABNORMAL LOW (ref >60.00)
Potassium: 4 mEq/L (ref 3.5–5.1)
Sodium: 135 mEq/L (ref 135–145)

## 2011-04-07 LAB — HEPATIC FUNCTION PANEL
AST: 24 U/L (ref 0–37)
Alkaline Phosphatase: 73 U/L (ref 39–117)
Bilirubin, Direct: 0 mg/dL (ref 0.0–0.3)

## 2011-04-07 LAB — LDL CHOLESTEROL, DIRECT: Direct LDL: 155.4 mg/dL

## 2011-04-07 MED ORDER — IBUPROFEN 600 MG PO TABS: 600.0000 mg | ORAL_TABLET | Freq: Four times a day (QID) | ORAL | Status: AC | PRN

## 2011-04-07 NOTE — Progress Notes (Signed)
  Subjective:    Patient ID: Tonya Johns, female    DOB: 1930-10-22, 78 y.o.   MRN: [id number]  HPI Hyperlipidemia- has been difficult to control b/c pt has been intolerant to statins in the past.  Tolerating Livalo w/out difficulty.  Due for labs today.  No N/V, myalgias.  Trap spasm- ongoing problem.  No relief w/ tylenol.  Reports prescription strength ibuprofen improved pain.   Review of Systems For ROS see HPI     Objective:   Physical Exam  Constitutional: She is oriented to person, place, and time. She appears well-developed and well-nourished. No distress.  HENT:  Head: Normocephalic and atraumatic.  Eyes: Conjunctivae and EOM are normal. Pupils are equal, round, and reactive to light.  Neck: Neck supple. Thyromegaly present.       Tight trap spasm bilaterally  Cardiovascular: Normal rate, regular rhythm and intact distal pulses.   Murmur (II/VI SEM) heard. Pulmonary/Chest: Effort normal and breath sounds normal. No respiratory distress.  Abdominal: Soft. She exhibits no distension. There is no tenderness.  Musculoskeletal: She exhibits no edema.  Lymphadenopathy:    She has no cervical adenopathy.  Neurological: She is alert and oriented to person, place, and time.  Skin: Skin is warm and dry.  Psychiatric: She has a normal mood and affect. Her behavior is normal.          Assessment & Plan:

## 2011-04-07 NOTE — Telephone Encounter (Signed)
Patient stated at check that she is no longer taking   Cyclobenzaprine HCl (Tab) FLEXERIL 5 MG Take 1 tablet (5 mg total) by mouth 3 (three) times daily as needed.   Thanks

## 2011-04-07 NOTE — Telephone Encounter (Signed)
Called pt to advise the flexirill has been removed from her med list during her OV however it takes 24 hours to update therefore it is still listed on her med list with her discharge instructions,pt understood

## 2011-04-07 NOTE — Patient Instructions (Signed)
Schedule your physical for after June 13 We'll notify you of your lab results Keep up the good work!  You look great! Restart the Ibuprofen as needed for pain and muscle spasm Call with any questions or concerns Happy Early Birthday!!!

## 2011-04-24 NOTE — Assessment & Plan Note (Signed)
Chronic problem.  Improves w/ prescription NSAIDs.  Restart ibuprofen and flexeril.

## 2011-04-24 NOTE — Assessment & Plan Note (Signed)
Pt's A1C was 6.3 in Oct.  Not on meds.  Controlling w/ diet, some exercise.  Recheck BMP.  Continue to follow.

## 2011-04-24 NOTE — Assessment & Plan Note (Signed)
Chronic problem.  Tolerating Livalo when in the past she was intolerant to statins.  Due for labs.  Adjust meds prn.

## 2011-04-25 ENCOUNTER — Other Ambulatory Visit: Payer: Self-pay | Admitting: Endocrinology

## 2011-04-25 ENCOUNTER — Other Ambulatory Visit (INDEPENDENT_AMBULATORY_CARE_PROVIDER_SITE_OTHER): Payer: Medicare Other

## 2011-04-25 ENCOUNTER — Encounter: Payer: Self-pay | Admitting: Endocrinology

## 2011-04-25 ENCOUNTER — Ambulatory Visit (INDEPENDENT_AMBULATORY_CARE_PROVIDER_SITE_OTHER): Payer: Medicare Other | Admitting: Endocrinology

## 2011-04-25 VITALS — BP 132/68 | HR 53 | Temp 98.2°F | Ht 64.0 in | Wt 182.0 lb

## 2011-04-25 DIAGNOSIS — E0590 Thyrotoxicosis, unspecified without thyrotoxic crisis or storm: Secondary | ICD-10-CM

## 2011-04-25 LAB — TSH: TSH: 147.01 u[IU]/mL — ABNORMAL HIGH (ref 0.35–5.50)

## 2011-04-25 MED ORDER — LEVOTHYROXINE SODIUM 100 MCG PO TABS: 100.0000 ug | ORAL_TABLET | Freq: Every day | ORAL | Status: DC

## 2011-04-25 NOTE — Patient Instructions (Addendum)
blood tests are being requested for you today.  You will receive a letter with results.  

## 2011-04-25 NOTE — Progress Notes (Signed)
  Subjective:    Patient ID: Tonya Johns, female    DOB: [birth year], 78 y.o.   MRN: [id number]  HPI Pt is 3 mos s/p i-131 rx, for hyperthyroidism due to multinodular goiter.  pt states she feels well in general, except for cold intolerance. Past Medical History  Diagnosis Date  . Arthritis   . Diverticulitis   . GERD (gastroesophageal reflux disease)   . Hyperlipidemia   . Osteoporosis   . Leg swelling     No past surgical history on file.  History   Social History  . Marital Status: Single    Spouse Name: N/A    Number of Children: N/A  . Years of Education: N/A   Occupational History  . Not on file.   Social History Main Topics  . Smoking status: Never Smoker   . Smokeless tobacco: Not on file  . Alcohol Use: No  . Drug Use: No  . Sexually Active:    Other Topics Concern  . Not on file   Social History Narrative  . No narrative on file    Current Outpatient Prescriptions on File Prior to Visit  Medication Sig Dispense Refill  . Coenzyme Q10 (CO Q10) 100 MG CAPS Take 100 mg by mouth daily.        . Multiple Vitamin (MULTIVITAMIN PO) Take 1,000 Units by mouth daily. Plus Vitamin D and C      . Pitavastatin Calcium (LIVALO) 4 MG TABS Take 1 tablet (4 mg total) by mouth once.  30 tablet  2  . sertraline (ZOLOFT) 50 MG tablet Take 1 tablet (50 mg total) by mouth daily.  30 tablet  2  . Zoledronic Acid (RECLAST IV) Inject into the vein. Taking it Once a Year         Allergies  Allergen Reactions  . Penicillins Rash    Family History  Problem Relation Age of Onset  . Alcohol abuse Brother   . Alcohol abuse Sister   . Arthritis Mother   . Arthritis Father   . Hyperlipidemia Brother   . Hyperlipidemia Sister   . Heart disease Father   . Stroke Mother   . Hypertension Mother   . Sudden death Brother   . Mental illness Sister   . Diabetes Sister     BP 132/68  Pulse 53  Temp(Src) 98.2 F (36.8 C) (Oral)  Ht 5\' 4"  (1.626 m)  Wt 182 lb (82.555 kg)  BMI  31.24 kg/m2  SpO2 98%  Review of Systems She reports a few lb of weight gain.      Objective:   Physical Exam VITAL SIGNS:  See vs page GENERAL: no distress Neck:  There is a question of enlargement of the right thyroid lobe, but i don't appreciate a discrete nodule.       Assessment & Plan:  Hyperthyroidism due to multinodular goiter, s/p i-131 rx

## 2011-06-07 ENCOUNTER — Encounter: Payer: Self-pay | Admitting: Endocrinology

## 2011-06-07 ENCOUNTER — Other Ambulatory Visit (INDEPENDENT_AMBULATORY_CARE_PROVIDER_SITE_OTHER): Payer: Medicare Other

## 2011-06-07 ENCOUNTER — Telehealth: Payer: Self-pay | Admitting: *Deleted

## 2011-06-07 ENCOUNTER — Ambulatory Visit (INDEPENDENT_AMBULATORY_CARE_PROVIDER_SITE_OTHER): Payer: Medicare Other | Admitting: Endocrinology

## 2011-06-07 VITALS — BP 120/70 | HR 65 | Temp 98.1°F | Ht 64.0 in | Wt 183.0 lb

## 2011-06-07 DIAGNOSIS — E0590 Thyrotoxicosis, unspecified without thyrotoxic crisis or storm: Secondary | ICD-10-CM

## 2011-06-07 LAB — TSH: TSH: 0.21 u[IU]/mL — ABNORMAL LOW (ref 0.35–5.50)

## 2011-06-07 NOTE — Telephone Encounter (Signed)
Called pt to inform of lab results, pt informed (letter also mailed to pt). 

## 2011-06-07 NOTE — Patient Instructions (Addendum)
blood tests are being requested for you today.  You will receive a letter with results. Please come back for a follow-up appointment in 6 weeks  

## 2011-06-07 NOTE — Progress Notes (Signed)
  Subjective:    Patient ID: Tonya Johns, female    DOB: Jan 23, 1931, 78 y.o.   MRN: [id number]  HPI Pt is 4 1/2 mos s/p i-131 rx, for hyperthyroidism due to multinodular goiter.  Since on the synthroid, pt states she feels no different, and well in general. Past Medical History  Diagnosis Date  . Arthritis   . Diverticulitis   . GERD (gastroesophageal reflux disease)   . Hyperlipidemia   . Osteoporosis   . Leg swelling     No past surgical history on file.  History   Social History  . Marital Status: Single    Spouse Name: N/A    Number of Children: N/A  . Years of Education: N/A   Occupational History  . Not on file.   Social History Main Topics  . Smoking status: Never Smoker   . Smokeless tobacco: Not on file  . Alcohol Use: No  . Drug Use: No  . Sexually Active:    Other Topics Concern  . Not on file   Social History Narrative  . No narrative on file    Current Outpatient Prescriptions on File Prior to Visit  Medication Sig Dispense Refill  . Coenzyme Q10 (CO Q10) 100 MG CAPS Take 100 mg by mouth daily.        Marland Kitchen levothyroxine (SYNTHROID, LEVOTHROID) 100 MCG tablet Take 1 tablet (100 mcg total) by mouth daily.  30 tablet  2  . Multiple Vitamin (MULTIVITAMIN PO) Take 1,000 Units by mouth daily. Plus Vitamin D and C      . Pitavastatin Calcium (LIVALO) 4 MG TABS Take 1 tablet (4 mg total) by mouth once.  30 tablet  2  . sertraline (ZOLOFT) 50 MG tablet Take 1 tablet (50 mg total) by mouth daily.  30 tablet  2  . Zoledronic Acid (RECLAST IV) Inject into the vein. Taking it Once a Year         Allergies  Allergen Reactions  . Penicillins Rash    Family History  Problem Relation Age of Onset  . Alcohol abuse Brother   . Alcohol abuse Sister   . Arthritis Mother   . Arthritis Father   . Hyperlipidemia Brother   . Hyperlipidemia Sister   . Heart disease Father   . Stroke Mother   . Hypertension Mother   . Sudden death Brother   . Mental illness Sister     . Diabetes Sister     BP 120/70  Pulse 65  Temp(Src) 98.1 F (36.7 C) (Oral)  Ht 5\' 4"  (1.626 m)  Wt 183 lb (83.008 kg)  BMI 31.41 kg/m2  SpO2 98%  Review of Systems She has a few lbs of weight change.    Objective:   Physical Exam VITAL SIGNS:  See vs page GENERAL: no distress NECK: There is no palpable thyroid enlargement.  No thyroid nodule is palpable.  No palpable lymphadenopathy at the anterior neck.   Lab Results  Component Value Date   TSH 0.21* 06/07/2011      Assessment & Plan:  Post-i-131 hypothyroidism, slightly overreplaced.  However, this may be offset by further effect of the i-131 rx.

## 2011-06-24 ENCOUNTER — Other Ambulatory Visit: Payer: Self-pay | Admitting: Family Medicine

## 2011-06-24 MED ORDER — SERTRALINE HCL 50 MG PO TABS: 50.0000 mg | ORAL_TABLET | Freq: Every day | ORAL | Status: DC

## 2011-06-24 NOTE — Telephone Encounter (Signed)
refill sertraline hcl 50mg  tablet Qty 30 Take one tablet by mouth daily Last fill 4.24.13 Last ov 3.7.13

## 2011-06-24 NOTE — Telephone Encounter (Signed)
rx sent to pharmacy by e-script Placed samples at front desk for pick up, called pt to advise, left vm

## 2011-06-24 NOTE — Telephone Encounter (Signed)
Pt would like samples of Livalo 4mg . Call [phone] if available.

## 2011-07-14 ENCOUNTER — Ambulatory Visit (INDEPENDENT_AMBULATORY_CARE_PROVIDER_SITE_OTHER): Payer: Medicare Other | Admitting: Family Medicine

## 2011-07-14 ENCOUNTER — Encounter: Payer: Self-pay | Admitting: Family Medicine

## 2011-07-14 VITALS — BP 128/80 | HR 66 | Temp 98.5°F | Ht 64.0 in | Wt 185.8 lb

## 2011-07-14 DIAGNOSIS — Z1231 Encounter for screening mammogram for malignant neoplasm of breast: Secondary | ICD-10-CM

## 2011-07-14 DIAGNOSIS — Z0000 Encounter for general adult medical examination without abnormal findings: Secondary | ICD-10-CM

## 2011-07-14 DIAGNOSIS — M810 Age-related osteoporosis without current pathological fracture: Secondary | ICD-10-CM

## 2011-07-14 DIAGNOSIS — E785 Hyperlipidemia, unspecified: Secondary | ICD-10-CM

## 2011-07-14 LAB — HEPATIC FUNCTION PANEL
AST: 22 U/L (ref 0–37)
Bilirubin, Direct: 0 mg/dL (ref 0.0–0.3)
Total Bilirubin: 0.6 mg/dL (ref 0.3–1.2)

## 2011-07-14 LAB — BASIC METABOLIC PANEL
BUN: 24 mg/dL — ABNORMAL HIGH (ref 6–23)
GFR: 52.66 mL/min — ABNORMAL LOW (ref >60.00)
Potassium: 4.5 mEq/L (ref 3.5–5.1)
Sodium: 141 mEq/L (ref 135–145)

## 2011-07-14 LAB — CBC WITH DIFFERENTIAL/PLATELET
Basophils Absolute: 0 10*3/uL (ref 0.0–0.1)
Basophils Relative: 0.5 % (ref 0.0–3.0)
Eosinophils Absolute: 0.2 10*3/uL (ref 0.0–0.7)
MCHC: 32.8 g/dL (ref 30.0–36.0)
MCV: 92.2 fl (ref 78.0–100.0)
Monocytes Absolute: 0.4 10*3/uL (ref 0.1–1.0)
Neutrophils Relative %: 65.8 % (ref 43.0–77.0)
Platelets: 193 10*3/uL (ref 150.0–400.0)
RDW: 14.6 % (ref 11.5–14.6)

## 2011-07-14 LAB — LIPID PANEL
Cholesterol: 183 mg/dL (ref 0–200)
LDL Cholesterol: 90 mg/dL (ref 0–99)
VLDL: 25.8 mg/dL (ref 0.0–40.0)

## 2011-07-14 NOTE — Assessment & Plan Note (Signed)
Chronic problem.  Tolerating statin w/out difficulty.  Check labs.  Adjust meds prn  

## 2011-07-14 NOTE — Progress Notes (Signed)
  Subjective:    Patient ID: Tonya Johns, female    DOB: 23-May-1930, 78 y.o.   MRN: [id number]  HPI Here today for CPE.  Risk Factors: Hyperlipidemia- chronic problem, on Livalo.  Denies abd pain, N/V, myalgias Hyperthyroid- seeing Dr Everardo All, on Synthroid Osteoporosis- chronic problem, got Reclast injxn in July last year.  Wants to hold off on reorder at this time until she sees results of DEXA b/c she had to pay $400 out of pocket Physical Activity: mowing the lawn regularly, staying active Fall Risk: low, steady on feet Depression: denies Hearing: normal to conversational and whispered tones at 6 ft ADL's: independent Cognitive: normal linear thought process, memory and attention intact Home Safety: safe at home, lives w/ son Height, Weight, BMI, Visual Acuity: see vitals, vision corrected to 20/20 w/ glasses Counseling: due for mammo/DEXA this summer.  colonoscopy in [place] in 2010 Labs Ordered: See A&P Care Plan: See A&P    Review of Systems Patient reports no vision/ hearing changes, adenopathy,fever, weight change,  persistant/recurrent hoarseness , swallowing issues, chest pain, palpitations, edema, persistant/recurrent cough, hemoptysis, dyspnea (rest/exertional/paroxysmal nocturnal), gastrointestinal bleeding (melena, rectal bleeding), abdominal pain, significant heartburn, bowel changes, GU symptoms (dysuria, hematuria, incontinence), Gyn symptoms (abnormal  bleeding, pain),  syncope, focal weakness, memory loss, numbness & tingling, skin/hair/nail changes, abnormal bruising or bleeding, anxiety, or depression.     Objective:   Physical Exam General Appearance:    Alert, cooperative, no distress, appears stated age  Head:    Normocephalic, without obvious abnormality, atraumatic  Eyes:    PERRL, conjunctiva/corneas clear, EOM's intact, fundi    benign, both eyes  Ears:    Normal TM's and external ear canals, both ears  Nose:   Nares normal, septum midline, mucosa  normal, no drainage    or sinus tenderness  Throat:   Lips, mucosa, and tongue normal; teeth and gums normal  Neck:   Supple, symmetrical, trachea midline, no adenopathy;    Thyroid: large R sided nodule, smaller nodules diffusely throughout consistent w/ multinodular goiter  Back:     Symmetric, no curvature, ROM normal, no CVA tenderness  Lungs:     Clear to auscultation bilaterally, respirations unlabored  Chest Wall:    No tenderness or deformity   Heart:    Regular rate and rhythm, S1 and S2 normal, no murmur, rub   or gallop  Breast Exam:    Deferred at pt's request (prefers to wait for mammo)  Abdomen:     Soft, non-tender, bowel sounds active all four quadrants,    no masses, no organomegaly  Genitalia:    Deferred at pt's request  Rectal:    Extremities:   Extremities normal, atraumatic, no cyanosis or edema  Pulses:   2+ and symmetric all extremities  Skin:   Skin color, texture, turgor normal, no rashes or lesions  Lymph nodes:   Cervical, supraclavicular, and axillary nodes normal  Neurologic:   CNII-XII intact, normal strength, sensation and reflexes    throughout          Assessment & Plan:

## 2011-07-14 NOTE — Patient Instructions (Addendum)
Follow up in 6 months to recheck cholesterol We'll notify you of your lab results and make any changes if needed Someone will call you with your mammo and bone density appts Keep up the good work!  You look great! Call with any questions or concerns Have a great trip!!!

## 2011-07-14 NOTE — Assessment & Plan Note (Signed)
Chronic problem.  Pt had Reclast last year.  Due for DEXA- will hold on rescheduling Reclast until DEXA results available due to pt's out of pocket cost last year.  Pt expressed understanding and is in agreement w/ plan.

## 2011-07-14 NOTE — Assessment & Plan Note (Signed)
Pt's PE WNL w/ exception of thyroid.  UTD on colonoscopy.  Due for DEXA and mammo.  No longer having paps.  Check labs.  Anticipatory guidance provided.

## 2011-07-18 ENCOUNTER — Encounter: Payer: Self-pay | Admitting: *Deleted

## 2011-07-18 ENCOUNTER — Telehealth: Payer: Self-pay | Admitting: Family Medicine

## 2011-07-18 LAB — VITAMIN D 1,25 DIHYDROXY: Vitamin D3 1, 25 (OH)2: 24 pg/mL

## 2011-07-18 NOTE — Telephone Encounter (Signed)
Pt states the [facility] did not receive her last bone density and mammogram that she had done in [place]. We did receive that information from her previous physician and it was scanned into EPIC on 07/28/10 and 08/02/10.

## 2011-07-26 ENCOUNTER — Other Ambulatory Visit (INDEPENDENT_AMBULATORY_CARE_PROVIDER_SITE_OTHER): Payer: Medicare Other

## 2011-07-26 ENCOUNTER — Ambulatory Visit (INDEPENDENT_AMBULATORY_CARE_PROVIDER_SITE_OTHER): Payer: Medicare Other | Admitting: Endocrinology

## 2011-07-26 ENCOUNTER — Encounter: Payer: Self-pay | Admitting: Endocrinology

## 2011-07-26 VITALS — BP 122/68 | HR 57 | Temp 97.8°F | Ht 64.0 in | Wt 185.0 lb

## 2011-07-26 DIAGNOSIS — E890 Postprocedural hypothyroidism: Secondary | ICD-10-CM | POA: Insufficient documentation

## 2011-07-26 MED ORDER — LEVOTHYROXINE SODIUM 50 MCG PO TABS: 50.0000 ug | ORAL_TABLET | Freq: Every day | ORAL | Status: DC

## 2011-07-26 NOTE — Patient Instructions (Addendum)
blood tests are being requested for you today.  You will receive a letter with results.  Please come back for a follow-up appointment in 3 months. 

## 2011-07-26 NOTE — Progress Notes (Signed)
  Subjective:    Patient ID: Tonya Johns, female    DOB: 1930/02/18, 78 y.o.   MRN: [id number]  HPI Pt is 6 mos s/p i-131 rx, for hyperthyroidism due to multinodular goiter.  pt states she feels no different, and well in general, except for light hair loss. Past Medical History  Diagnosis Date  . Arthritis   . Diverticulitis   . GERD (gastroesophageal reflux disease)   . Hyperlipidemia   . Osteoporosis   . Leg swelling     No past surgical history on file.  History   Social History  . Marital Status: Single    Spouse Name: N/A    Number of Children: N/A  . Years of Education: N/A   Occupational History  . Not on file.   Social History Main Topics  . Smoking status: Never Smoker   . Smokeless tobacco: Not on file  . Alcohol Use: No  . Drug Use: No  . Sexually Active:    Other Topics Concern  . Not on file   Social History Narrative  . No narrative on file    Current Outpatient Prescriptions on File Prior to Visit  Medication Sig Dispense Refill  . Coenzyme Q10 (CO Q10) 100 MG CAPS Take 100 mg by mouth daily.        . Multiple Vitamin (MULTIVITAMIN PO) Take 1,000 Units by mouth daily. Plus Vitamin D and C      . Pitavastatin Calcium (LIVALO) 4 MG TABS Take 1 tablet (4 mg total) by mouth once.  30 tablet  2  . sertraline (ZOLOFT) 50 MG tablet Take 1 tablet (50 mg total) by mouth daily.  30 tablet  5  . Zoledronic Acid (RECLAST IV) Inject into the vein. Taking it Once a Year       . levothyroxine (SYNTHROID, LEVOTHROID) 50 MCG tablet Take 1 tablet (50 mcg total) by mouth daily.  30 tablet  5    Allergies  Allergen Reactions  . Penicillins Rash    Family History  Problem Relation Age of Onset  . Alcohol abuse Brother   . Alcohol abuse Sister   . Arthritis Mother   . Arthritis Father   . Hyperlipidemia Brother   . Hyperlipidemia Sister   . Heart disease Father   . Stroke Mother   . Hypertension Mother   . Sudden death Brother   . Mental illness Sister   .  Diabetes Sister    BP 122/68  Pulse 57  Temp 97.8 F (36.6 C) (Oral)  Ht 5\' 4"  (1.626 m)  Wt 185 lb (83.915 kg)  BMI 31.76 kg/m2  SpO2 99%  Review of Systems Denies weight change    Objective:   Physical Exam VITAL SIGNS:  See vs page GENERAL: no distress NECK: There is no palpable thyroid enlargement.  No thyroid nodule is palpable.  No palpable lymphadenopathy at the anterior neck.  Lab Results  Component Value Date   TSH 0.21* 07/26/2011      Assessment & Plan:  Post-i-131 hypothyroidism.  Slightly overreplaced

## 2011-07-27 ENCOUNTER — Telehealth: Payer: Self-pay | Admitting: *Deleted

## 2011-07-27 NOTE — Telephone Encounter (Signed)
Called pt to inform of lab results, pt informed (letter also mailed to pt). 

## 2011-08-10 ENCOUNTER — Ambulatory Visit
Admission: RE | Admit: 2011-08-10 | Discharge: 2011-08-10 | Disposition: A | Discharge status: Home / Self Care | Payer: Medicare Other | Source: Ambulatory Visit | Attending: Family Medicine | Admitting: Family Medicine

## 2011-08-10 DIAGNOSIS — Z1231 Encounter for screening mammogram for malignant neoplasm of breast: Secondary | ICD-10-CM

## 2011-08-10 DIAGNOSIS — M810 Age-related osteoporosis without current pathological fracture: Secondary | ICD-10-CM

## 2011-08-30 ENCOUNTER — Telehealth: Payer: Self-pay | Admitting: *Deleted

## 2011-08-30 NOTE — Telephone Encounter (Signed)
Called pt to advise results of Bone Density test to advise she now has Osteopenia and needs to consume Calcium 1200 and Vit D 800 daily which can be done by taking 2 OTC Caltrate daily noted the following Noted waiver in pt chart signed to allow detailed messages to be left on voicemail, left detailed message about: results/instructions/prescribtion information. Advise if any further concerns or questions please call our office at [phone]. letter mailed to patients home address with results.

## 2011-09-06 ENCOUNTER — Encounter: Payer: Self-pay | Admitting: Family Medicine

## 2011-10-06 NOTE — Telephone Encounter (Signed)
[facility] uses the same system we do, Epic, and the mammo & dexa are scanned into epic under "media" for their review.

## 2011-10-19 ENCOUNTER — Telehealth: Payer: Self-pay | Admitting: *Deleted

## 2011-10-19 NOTE — Telephone Encounter (Signed)
Pt walked in to ask for samples of her Livalo 4mg , advised to call instead of walking in to get samples per these particular samples are not in stock a lot of the times, noted only 2mg  tabs available, pt was given 4 boxes and advised to take 2 tabs daily to equal the 4mg  per verbal  From MD Tabori, pt understood and given coupon card

## 2011-10-25 ENCOUNTER — Ambulatory Visit (INDEPENDENT_AMBULATORY_CARE_PROVIDER_SITE_OTHER): Payer: Medicare Other | Admitting: Endocrinology

## 2011-10-25 ENCOUNTER — Encounter: Payer: Self-pay | Admitting: Endocrinology

## 2011-10-25 ENCOUNTER — Other Ambulatory Visit (INDEPENDENT_AMBULATORY_CARE_PROVIDER_SITE_OTHER): Payer: Medicare Other

## 2011-10-25 VITALS — BP 120/68 | HR 70 | Temp 98.6°F | Ht 64.0 in | Wt 192.0 lb

## 2011-10-25 DIAGNOSIS — E890 Postprocedural hypothyroidism: Secondary | ICD-10-CM

## 2011-10-25 NOTE — Patient Instructions (Addendum)
blood tests are being requested for you today.  You will receive a letter with results. Please come back for a follow-up appointment in 4 months.   most of the time, a "lumpy thyroid" will eventually become overactive again.

## 2011-10-25 NOTE — Progress Notes (Signed)
  Subjective:    Patient ID: Tonya Johns, female    DOB: [birth year], 78 y.o.   MRN: [id number]  HPI Pt is 9 mos s/p i-131 rx, for hyperthyroidism due to multinodular goiter.  pt states she feels well in general, except for weight gain.  Past Medical History  Diagnosis Date  . Arthritis   . Diverticulitis   . GERD (gastroesophageal reflux disease)   . Hyperlipidemia   . Osteoporosis   . Leg swelling     No past surgical history on file.  History   Social History  . Marital Status: Single    Spouse Name: N/A    Number of Children: N/A  . Years of Education: N/A   Occupational History  . Not on file.   Social History Main Topics  . Smoking status: Never Smoker   . Smokeless tobacco: Not on file  . Alcohol Use: No  . Drug Use: No  . Sexually Active:    Other Topics Concern  . Not on file   Social History Narrative  . No narrative on file    Current Outpatient Prescriptions on File Prior to Visit  Medication Sig Dispense Refill  . Coenzyme Q10 (CO Q10) 100 MG CAPS Take 100 mg by mouth daily.        Marland Kitchen levothyroxine (SYNTHROID, LEVOTHROID) 50 MCG tablet Take 1 tablet (50 mcg total) by mouth daily.  30 tablet  5  . Multiple Vitamin (MULTIVITAMIN PO) Take 1,000 Units by mouth daily. Plus Vitamin D and C      . Pitavastatin Calcium (LIVALO) 4 MG TABS Take 1 tablet (4 mg total) by mouth once.  30 tablet  2  . sertraline (ZOLOFT) 50 MG tablet Take 1 tablet (50 mg total) by mouth daily.  30 tablet  5  . Zoledronic Acid (RECLAST IV) Inject into the vein. Taking it Once a Year         Allergies  Allergen Reactions  . Penicillins Rash    Family History  Problem Relation Age of Onset  . Alcohol abuse Brother   . Alcohol abuse Sister   . Arthritis Mother   . Arthritis Father   . Hyperlipidemia Brother   . Hyperlipidemia Sister   . Heart disease Father   . Stroke Mother   . Hypertension Mother   . Sudden death Brother   . Mental illness Sister   . Diabetes Sister      BP 120/68  Pulse 70  Temp 98.6 F (37 C) (Oral)  Ht 5\' 4"  (1.626 m)  Wt 192 lb (87.091 kg)  BMI 32.96 kg/m2  SpO2 98%    Review of Systems Denies neck pain    Objective:   Physical Exam VITAL SIGNS:  See vs page GENERAL: no distress Neck: slightly enlarged, with irregular surface   Lab Results  Component Value Date   TSH 1.89 10/25/2011      Assessment & Plan:  Post-i-131 hypothyroidism, well-replaced

## 2011-10-26 ENCOUNTER — Encounter: Payer: Self-pay | Admitting: Endocrinology

## 2011-11-09 ENCOUNTER — Telehealth: Payer: Self-pay | Admitting: Family Medicine

## 2011-11-09 NOTE — Telephone Encounter (Signed)
Please provide if available

## 2011-11-09 NOTE — Telephone Encounter (Signed)
Pt needs samples of Levalo 4mg --saw [facility] and he was out did not have any -- do we have any we can provide? cb# [phone]

## 2011-11-09 NOTE — Telephone Encounter (Signed)
Please advise if ok to give samples

## 2011-11-10 NOTE — Telephone Encounter (Signed)
Left message with pt family that her samples are ready for pick up, note placed in bag that the samples for 4mg  are out but pt can double the 2mg  and take 2 tabs to complete the dosage, family member will advised pt to  come by office. Placed up front for pick up

## 2011-11-16 ENCOUNTER — Ambulatory Visit (INDEPENDENT_AMBULATORY_CARE_PROVIDER_SITE_OTHER): Payer: Medicare Other

## 2011-11-16 DIAGNOSIS — Z23 Encounter for immunization: Secondary | ICD-10-CM

## 2011-11-24 ENCOUNTER — Other Ambulatory Visit: Payer: Self-pay

## 2011-11-24 ENCOUNTER — Telehealth: Payer: Self-pay | Admitting: Cardiovascular Disease

## 2011-11-24 NOTE — Telephone Encounter (Signed)
Ok for [employer]

## 2011-11-24 NOTE — Telephone Encounter (Signed)
Advised pt that per Dr. Harvie Bridge last note, pt does not need an additional ECHO.

## 2011-11-24 NOTE — Telephone Encounter (Signed)
New Problem:    Patient called in wanting to know when she was supposed to have another ECHO.  Please call back.

## 2011-11-24 NOTE — Telephone Encounter (Signed)
Pt called in left message on triage line stating wanting Valvalo samples.  Plz advise    MW

## 2011-11-25 NOTE — Telephone Encounter (Signed)
Called pt back advised pt Livalo samples upfront. Pt stated understanding.    MW

## 2011-12-05 ENCOUNTER — Other Ambulatory Visit: Payer: Self-pay

## 2011-12-05 NOTE — Telephone Encounter (Signed)
We do not have any LIVALO samples in the office at this time. Pt stated that she does not want an Rx at this time, she cannot afford the medication.

## 2011-12-05 NOTE — Telephone Encounter (Signed)
Ok for [employer]

## 2011-12-05 NOTE — Telephone Encounter (Signed)
Pt requesting Livalo samples. OV 10/25/11 Livalo last filled 09/21/10 #30 x 2  Plz advise    MW

## 2011-12-19 ENCOUNTER — Other Ambulatory Visit: Payer: Self-pay | Admitting: Family Medicine

## 2011-12-19 MED ORDER — SERTRALINE HCL 50 MG PO TABS: 50.0000 mg | ORAL_TABLET | Freq: Every day | ORAL | Status: DC

## 2011-12-19 NOTE — Telephone Encounter (Signed)
refill Sertraline HCl (Tab) 50 MG Take 1 tablet (50 mg total) by mouth daily #30 wt/5-refills last fill 10.21.13 last ov 6.13.13 Annual Exam

## 2011-12-19 NOTE — Telephone Encounter (Signed)
Rx sent 

## 2012-01-15 ENCOUNTER — Other Ambulatory Visit: Payer: Self-pay | Admitting: Endocrinology

## 2012-01-16 ENCOUNTER — Ambulatory Visit (INDEPENDENT_AMBULATORY_CARE_PROVIDER_SITE_OTHER): Payer: Medicare Other | Admitting: Family Medicine

## 2012-01-16 ENCOUNTER — Encounter: Payer: Self-pay | Admitting: Family Medicine

## 2012-01-16 VITALS — BP 130/68 | HR 68 | Wt 197.0 lb

## 2012-01-16 DIAGNOSIS — E890 Postprocedural hypothyroidism: Secondary | ICD-10-CM

## 2012-01-16 DIAGNOSIS — F329 Major depressive disorder, single episode, unspecified: Secondary | ICD-10-CM

## 2012-01-16 DIAGNOSIS — E785 Hyperlipidemia, unspecified: Secondary | ICD-10-CM

## 2012-01-16 LAB — LIPID PANEL
Cholesterol: 291 mg/dL — ABNORMAL HIGH (ref 0–200)
Total CHOL/HDL Ratio: 6

## 2012-01-16 LAB — HEPATIC FUNCTION PANEL
ALT: 20 U/L (ref 0–35)
AST: 20 U/L (ref 0–37)
Bilirubin, Direct: 0 mg/dL (ref 0.0–0.3)
Total Bilirubin: 0.5 mg/dL (ref 0.3–1.2)

## 2012-01-16 LAB — TSH: TSH: 26.26 u[IU]/mL — ABNORMAL HIGH (ref 0.35–5.50)

## 2012-01-16 MED ORDER — EZETIMIBE 10 MG PO TABS: 10.0000 mg | ORAL_TABLET | Freq: Every day | ORAL | Status: DC

## 2012-01-16 NOTE — Progress Notes (Signed)
  Subjective:    Patient ID: Tonya Johns, female    DOB: [birth year], 78 y.o.   MRN: [id number]  HPI Hyperlipidemia- chronic problem, previously on Livalo and doing well.  Has not taken meds in 1 month due to poor insurance coverage and cost.  In the past, has been intolerant to statins due to nocturnal cough and 'feeling different' which improved when stopping meds.  Has never been on Zetia.  Denies abd pain, N/V, myalgias.  Hypothyroid- pt has gained 20 lbs but denies dietary changes.  'i'm very particular about what i eat'.  On synthroid daily.  Increased cold sensitivity- 'my feet stay cold, especially at night'.  No constipation.  + dry skin, hair, nails.  Depression- ongoing issue but pt just found out that daughter has bilateral breat cancer and will need tx.  (found this out via phone last night).  Pt has not yet come to terms w/ dx and is understandably upset.   Review of Systems For ROS see HPI     Objective:   Physical Exam  Vitals reviewed. Constitutional: She is oriented to person, place, and time. She appears well-developed and well-nourished. No distress.  HENT:  Head: Normocephalic and atraumatic.  Eyes: Conjunctivae normal and EOM are normal. Pupils are equal, round, and reactive to light.  Neck: Normal range of motion. Neck supple. Thyromegaly (multinodular goiter present) present.  Cardiovascular: Normal rate, regular rhythm, normal heart sounds and intact distal pulses.   No murmur heard. Pulmonary/Chest: Effort normal and breath sounds normal. No respiratory distress.  Abdominal: Soft. She exhibits no distension. There is no tenderness.  Musculoskeletal: She exhibits no edema.  Lymphadenopathy:    She has no cervical adenopathy.  Neurological: She is alert and oriented to person, place, and time.  Skin: Skin is warm and dry.  Psychiatric: She has a normal mood and affect. Her behavior is normal.          Assessment & Plan:

## 2012-01-16 NOTE — Patient Instructions (Addendum)
Follow up in 1 month to recheck mood Start the Zetia daily We'll notify you of your lab results Call with any questions or concerns Happy Holidays! Hang in there!

## 2012-01-16 NOTE — Assessment & Plan Note (Signed)
Deteriorated.  Pt is gaining weight, fatigued, dry skin/hair/nails.  Doesn't have endo appt until late Jan.  Check labs.  Adjust meds prn.  Reviewed supportive care and red flags that should prompt return.  Pt expressed understanding and is in agreement w/ plan.

## 2012-01-16 NOTE — Assessment & Plan Note (Signed)
Chronic problem- has chance to deteriorate due to daughter's recent dx.  Discussed normal grieving/acceptance process.  Pt to call if sxs severe or unrelenting.  Will follow closely.

## 2012-01-16 NOTE — Assessment & Plan Note (Signed)
Chronic problem.  Pt was doing very well on Livalo but has had difficulty w/ insurance coverage and if no samples available, unable to afford meds.  Will switch to Zetia, month of samples given.  Check labs.  Follow closely.

## 2012-01-17 ENCOUNTER — Other Ambulatory Visit: Payer: Self-pay | Admitting: *Deleted

## 2012-01-17 MED ORDER — LEVOTHYROXINE SODIUM 100 MCG PO TABS: 100.0000 ug | ORAL_TABLET | Freq: Every day | ORAL | Status: DC

## 2012-02-22 ENCOUNTER — Other Ambulatory Visit: Payer: Self-pay | Admitting: Endocrinology

## 2012-02-22 ENCOUNTER — Encounter: Payer: Self-pay | Admitting: Endocrinology

## 2012-02-22 ENCOUNTER — Ambulatory Visit (INDEPENDENT_AMBULATORY_CARE_PROVIDER_SITE_OTHER): Payer: Medicare Other | Admitting: Endocrinology

## 2012-02-22 VITALS — BP 122/80 | HR 78 | Temp 98.8°F | Wt 205.0 lb

## 2012-02-22 DIAGNOSIS — E890 Postprocedural hypothyroidism: Secondary | ICD-10-CM

## 2012-02-22 LAB — TSH: TSH: 1.15 u[IU]/mL (ref 0.35–5.50)

## 2012-02-22 MED ORDER — LEVOTHYROXINE SODIUM 100 MCG PO TABS: 100.0000 ug | ORAL_TABLET | Freq: Every day | ORAL | Status: DC

## 2012-02-22 NOTE — Patient Instructions (Addendum)
blood tests are being requested for you today.  We'll contact you with results. Please come back for a follow-up appointment in 2 months.   most of the time, a "lumpy thyroid" will eventually become overactive again.

## 2012-02-22 NOTE — Progress Notes (Signed)
  Subjective:    Patient ID: Tonya Johns, female    DOB: 1930/04/08, 79 y.o.   MRN: [id number]  HPI Pt had 131 rx, for hyperthyroidism due to multinodular goiter, in late 2012.  pt states she feels well in general, except for weight gain.  Last month, synthroid was increased from 50 to 100 mcg/day.  However, she has only been on the increased dosage x 10 days.  Cold intolerance is much better. Past Medical History  Diagnosis Date  . Arthritis   . Diverticulitis   . GERD (gastroesophageal reflux disease)   . Hyperlipidemia   . Osteoporosis   . Leg swelling     No past surgical history on file.  History   Social History  . Marital Status: Single    Spouse Name: N/A    Number of Children: N/A  . Years of Education: N/A   Occupational History  . Not on file.   Social History Main Topics  . Smoking status: Never Smoker   . Smokeless tobacco: Not on file  . Alcohol Use: No  . Drug Use: No  . Sexually Active:    Other Topics Concern  . Not on file   Social History Narrative  . No narrative on file    Current Outpatient Prescriptions on File Prior to Visit  Medication Sig Dispense Refill  . Coenzyme Q10 (CO Q10) 100 MG CAPS Take 100 mg by mouth daily.        Marland Kitchen ezetimibe (ZETIA) 10 MG tablet Take 1 tablet (10 mg total) by mouth daily.  30 tablet  3  . levothyroxine (SYNTHROID, LEVOTHROID) 100 MCG tablet Take 1 tablet (100 mcg total) by mouth daily.  30 tablet  1  . Multiple Vitamin (MULTIVITAMIN PO) Take 1,000 Units by mouth daily. Plus Vitamin D and C      . sertraline (ZOLOFT) 50 MG tablet Take 1 tablet (50 mg total) by mouth daily.  30 tablet  1  . Zoledronic Acid (RECLAST IV) Inject into the vein. Taking it Once a Year         Allergies  Allergen Reactions  . Penicillins Rash    Family History  Problem Relation Age of Onset  . Alcohol abuse Brother   . Alcohol abuse Sister   . Arthritis Mother   . Arthritis Father   . Hyperlipidemia Brother   . Hyperlipidemia  Sister   . Heart disease Father   . Stroke Mother   . Hypertension Mother   . Sudden death Brother   . Mental illness Sister   . Diabetes Sister     BP 122/80  Pulse 78  Temp 98.8 F (37.1 C) (Oral)  Wt 205 lb (92.987 kg)  SpO2 95%  Review of Systems She has weight gain    Objective:   Physical Exam VITAL SIGNS:  See vs page GENERAL: no distress Thyroid: fullness at the right lobe.  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  Uncertain control. Multinodular goiter, persistent on exam.

## 2012-03-19 ENCOUNTER — Telehealth: Payer: Self-pay | Admitting: Family Medicine

## 2012-03-19 NOTE — Telephone Encounter (Signed)
refill  Sertraline HCl (Tab) 50 MG Take 1 tablet (50 mg total) by mouth daily. #30 wt-1/refill last fill 1.11.14

## 2012-03-20 NOTE — Telephone Encounter (Signed)
Please advise on RF request.  Last OV for depression:12-27-10.//AB/CMA

## 2012-03-21 NOTE — Telephone Encounter (Signed)
Ok for #30, 6 refills 

## 2012-03-22 MED ORDER — SERTRALINE HCL 50 MG PO TABS: 50.0000 mg | ORAL_TABLET | Freq: Every day | ORAL | Status: DC

## 2012-03-22 NOTE — Telephone Encounter (Signed)
Refill done.  

## 2012-04-18 ENCOUNTER — Encounter: Payer: Self-pay | Admitting: Endocrinology

## 2012-04-18 ENCOUNTER — Ambulatory Visit (INDEPENDENT_AMBULATORY_CARE_PROVIDER_SITE_OTHER): Payer: Medicare Other | Admitting: Endocrinology

## 2012-04-18 VITALS — BP 134/80 | HR 90 | Wt 209.0 lb

## 2012-04-18 DIAGNOSIS — E890 Postprocedural hypothyroidism: Secondary | ICD-10-CM

## 2012-04-18 NOTE — Progress Notes (Signed)
  Subjective:    Patient ID: Tonya Johns, female    DOB: 06-20-1930, 79 y.o.   MRN: [id number]  HPI Pt had 131 rx, for hyperthyroidism due to multinodular goiter, in late 2012.  pt states she feels well in general, except for ongoing weight gain.  She takes synthroid as rx'ed.   Past Medical History  Diagnosis Date  . Arthritis   . Diverticulitis   . GERD (gastroesophageal reflux disease)   . Hyperlipidemia   . Osteoporosis   . Leg swelling     No past surgical history on file.  History   Social History  . Marital Status: Single    Spouse Name: N/A    Number of Children: N/A  . Years of Education: N/A   Occupational History  . Not on file.   Social History Main Topics  . Smoking status: Never Smoker   . Smokeless tobacco: Not on file  . Alcohol Use: No  . Drug Use: No  . Sexually Active:    Other Topics Concern  . Not on file   Social History Narrative  . No narrative on file    Current Outpatient Prescriptions on File Prior to Visit  Medication Sig Dispense Refill  . Coenzyme Q10 (CO Q10) 100 MG CAPS Take 100 mg by mouth daily.        Marland Kitchen ezetimibe (ZETIA) 10 MG tablet Take 1 tablet (10 mg total) by mouth daily.  30 tablet  3  . levothyroxine (SYNTHROID, LEVOTHROID) 100 MCG tablet Take 1 tablet (100 mcg total) by mouth daily.  30 tablet  3  . Multiple Vitamin (MULTIVITAMIN PO) Take 1,000 Units by mouth daily. Plus Vitamin D and C      . sertraline (ZOLOFT) 50 MG tablet Take 1 tablet (50 mg total) by mouth daily.  30 tablet  6  . Zoledronic Acid (RECLAST IV) Inject into the vein. Taking it Once a Year        No current facility-administered medications on file prior to visit.    Allergies  Allergen Reactions  . Penicillins Rash    Family History  Problem Relation Age of Onset  . Alcohol abuse Brother   . Alcohol abuse Sister   . Arthritis Mother   . Arthritis Father   . Hyperlipidemia Brother   . Hyperlipidemia Sister   . Heart disease Father   . Stroke  Mother   . Hypertension Mother   . Sudden death Brother   . Mental illness Sister   . Diabetes Sister     BP 134/80  Pulse 90  Wt 209 lb (94.802 kg)  BMI 35.86 kg/m2  SpO2 97%   Review of Systems She also reports hair loss.      Objective:   Physical Exam VITAL SIGNS:  See vs page GENERAL: no distress Skin: not diaphoretic Neuro: no tremor  Lab Results  Component Value Date   TSH 0.37 04/18/2012      Assessment & Plan:  Post-i-131 hypothyroidism, well-replaced

## 2012-04-18 NOTE — Patient Instructions (Addendum)
blood tests are being requested for you today.  We'll contact you with results. Please come back for a follow-up appointment in 6 months.   most of the time, a "lumpy thyroid" will eventually become overactive again.

## 2012-06-20 ENCOUNTER — Telehealth: Payer: Self-pay | Admitting: Family Medicine

## 2012-06-20 MED ORDER — EZETIMIBE 10 MG PO TABS: 10.0000 mg | ORAL_TABLET | Freq: Every day | ORAL | Status: DC

## 2012-06-20 NOTE — Telephone Encounter (Signed)
Pt states she has arthritis pain in her shoulder again and would like to know if we can call in the same medication we prescribed for her last time she had this problem. Pt also states she needs new rx for Zetia. When advised to contact her pharmacy, patient states her pharmacy told her she needed to contact us. [facility] [address]

## 2012-06-20 NOTE — Telephone Encounter (Signed)
Do not see where you had prescribed pt an arthritis medication in the past. Please advise.

## 2012-06-21 MED ORDER — IBUPROFEN 600 MG PO TABS: 600.0000 mg | ORAL_TABLET | Freq: Three times a day (TID) | ORAL | Status: DC | PRN

## 2012-06-21 NOTE — Telephone Encounter (Signed)
Informed the pt that rx's were sent to the pharmacy.//AB/CMA

## 2012-06-21 NOTE — Telephone Encounter (Signed)
Ok to refill Zetia x6 months Don't see any particular arthritis med w/ exception of Ibuprofen 600mg  on list.  1 tab TID prn, #60, no refills.

## 2012-07-14 ENCOUNTER — Other Ambulatory Visit: Payer: Self-pay | Admitting: Endocrinology

## 2012-07-16 ENCOUNTER — Other Ambulatory Visit: Payer: Self-pay | Admitting: *Deleted

## 2012-07-16 MED ORDER — LEVOTHYROXINE SODIUM 100 MCG PO TABS: 100.0000 ug | ORAL_TABLET | Freq: Every day | ORAL | Status: DC

## 2012-08-14 ENCOUNTER — Ambulatory Visit (INDEPENDENT_AMBULATORY_CARE_PROVIDER_SITE_OTHER): Payer: Medicare Other | Admitting: Family Medicine

## 2012-08-14 ENCOUNTER — Encounter: Payer: Self-pay | Admitting: Family Medicine

## 2012-08-14 VITALS — BP 130/70 | HR 66 | Temp 98.2°F | Ht 63.5 in | Wt 204.4 lb

## 2012-08-14 DIAGNOSIS — E785 Hyperlipidemia, unspecified: Secondary | ICD-10-CM

## 2012-08-14 DIAGNOSIS — Z0000 Encounter for general adult medical examination without abnormal findings: Secondary | ICD-10-CM

## 2012-08-14 DIAGNOSIS — Z1231 Encounter for screening mammogram for malignant neoplasm of breast: Secondary | ICD-10-CM

## 2012-08-14 DIAGNOSIS — E890 Postprocedural hypothyroidism: Secondary | ICD-10-CM

## 2012-08-14 DIAGNOSIS — M810 Age-related osteoporosis without current pathological fracture: Secondary | ICD-10-CM

## 2012-08-14 LAB — CBC WITH DIFFERENTIAL/PLATELET
Basophils Relative: 0.8 % (ref 0.0–3.0)
Eosinophils Absolute: 0.1 10*3/uL (ref 0.0–0.7)
Eosinophils Relative: 2.2 % (ref 0.0–5.0)
HCT: 34.4 % — ABNORMAL LOW (ref 36.0–46.0)
Lymphs Abs: 1.8 10*3/uL (ref 0.7–4.0)
MCHC: 33.3 g/dL (ref 30.0–36.0)
MCV: 91.3 fl (ref 78.0–100.0)
Monocytes Absolute: 0.4 10*3/uL (ref 0.1–1.0)
Platelets: 215 10*3/uL (ref 150.0–400.0)
RBC: 3.77 Mil/uL — ABNORMAL LOW (ref 3.87–5.11)
WBC: 6.4 10*3/uL (ref 4.5–10.5)

## 2012-08-14 LAB — HEPATIC FUNCTION PANEL
Albumin: 3.2 g/dL — ABNORMAL LOW (ref 3.5–5.2)
Total Bilirubin: 0.4 mg/dL (ref 0.3–1.2)

## 2012-08-14 LAB — LIPID PANEL: Triglycerides: 151 mg/dL — ABNORMAL HIGH (ref 0.0–149.0)

## 2012-08-14 LAB — BASIC METABOLIC PANEL
BUN: 22 mg/dL (ref 6–23)
Calcium: 9.3 mg/dL (ref 8.4–10.5)
Creatinine, Ser: 1.5 mg/dL — ABNORMAL HIGH (ref 0.4–1.2)
GFR: 43.61 mL/min — ABNORMAL LOW (ref >60.00)
Glucose, Bld: 95 mg/dL (ref 70–99)

## 2012-08-14 LAB — LDL CHOLESTEROL, DIRECT: Direct LDL: 182.2 mg/dL

## 2012-08-14 NOTE — Assessment & Plan Note (Signed)
Following w/ Dr Everardo All.  Will check TSH and forward results since pt is having lab work done today.

## 2012-08-14 NOTE — Assessment & Plan Note (Signed)
Pt's PE WNL.  UTD on colonoscopy.  Due for mammo- will arrange.  Check labs.  Anticipatory guidance provided.

## 2012-08-14 NOTE — Assessment & Plan Note (Signed)
Pt's T scores improved last year and now falls into osteopenia range.  No need for Reclast at this time.  On Ca and Vit D daily.  Check Vit D level.  Will continue to follow w/ q2 DEXAs.

## 2012-08-14 NOTE — Progress Notes (Signed)
  Subjective:    Patient ID: Tonya Johns, female    DOB: [birth year], 79 y.o.   MRN: [id number]  HPI Here today for CPE.  Risk Factors: Hyperlipidemia- chronic problem, on Zetia.  Denies abd pain, N/V, myalgias Osteoporosis- pt's most recent DEXA showed improvement in bone density, now Osteopenia.  Currently taking Ca and Vit D Hypothyroid- s/p ablation.  Following w/ Dr Everardo All. Physical Activity: very active in yard Fall Risk: low risk Depression: denies current sxs, no longer on SSRI Hearing: normal to conversational tones, decreased to whispered voice ADL's: independent Cognitive: normal linear thought process, memory and attention intact Home Safety: lives w/ son, feels 'very safe' Height, Weight, BMI, Visual Acuity: see vitals, vision corrected to 20/20 w/ glasses Counseling: UTD on colonoscopy (2010 or 2011 per pt report), UTD on mammo and DEXA Labs Ordered: See A&P Care Plan: See A&P    Review of Systems Patient reports no vision/ hearing changes, adenopathy,fever, weight change,  persistant/recurrent hoarseness , swallowing issues, chest pain, palpitations, edema, persistant/recurrent cough, hemoptysis, dyspnea (rest/exertional/paroxysmal nocturnal), gastrointestinal bleeding (melena, rectal bleeding), abdominal pain, significant heartburn, bowel changes, GU symptoms (dysuria, hematuria, incontinence), Gyn symptoms (abnormal  bleeding, pain),  syncope, focal weakness, memory loss, numbness & tingling, skin/hair/nail changes, abnormal bruising or bleeding, anxiety, or depression.     Objective:   Physical Exam General Appearance:    Alert, cooperative, no distress, appears stated age  Head:    Normocephalic, without obvious abnormality, atraumatic  Eyes:    PERRL, conjunctiva/corneas clear, EOM's intact, fundi    benign, both eyes  Ears:    Normal TM's and external ear canals, both ears  Nose:   Nares normal, septum midline, mucosa normal, no drainage    or sinus tenderness   Throat:   Lips, mucosa, and tongue normal; teeth and gums normal  Neck:   Supple, symmetrical, trachea midline, no adenopathy;    Thyroid: multinodular goiter  Back:     Symmetric, no curvature, ROM normal, no CVA tenderness  Lungs:     Clear to auscultation bilaterally, respirations unlabored  Chest Wall:    No tenderness or deformity   Heart:    Regular rate and rhythm, S1 and S2 normal, no murmur, rub   or gallop  Breast Exam:    Deferred to mammo  Abdomen:     Soft, non-tender, bowel sounds active all four quadrants,    no masses, no organomegaly  Genitalia:    Deferred at pt request  Rectal:    Extremities:   Extremities normal, atraumatic, no cyanosis or edema  Pulses:   2+ and symmetric all extremities  Skin:   Skin color, texture, turgor normal, no rashes or lesions  Lymph nodes:   Cervical, supraclavicular, and axillary nodes normal  Neurologic:   CNII-XII intact, normal strength, sensation and reflexes    throughout          Assessment & Plan:

## 2012-08-14 NOTE — Assessment & Plan Note (Signed)
Chronic problem.  Pt intolerant to statins.  Doing well on Zetia.  Check labs.  Adjust meds prn

## 2012-08-14 NOTE — Patient Instructions (Addendum)
Follow up in 6 months to recheck cholesterol We'll call you with your mammo appt Keep up the good work!  You look great! We'll notify you of your lab results and make any changes if needed Call with any questions or concerns Have a great summer!!

## 2012-08-17 NOTE — Progress Notes (Signed)
Left detailed message on voice mail regarding labs.

## 2012-08-18 LAB — VITAMIN D 1,25 DIHYDROXY: Vitamin D 1, 25 (OH)2 Total: 17 pg/mL — ABNORMAL LOW (ref 18–72)

## 2012-08-24 ENCOUNTER — Other Ambulatory Visit: Payer: Self-pay | Admitting: *Deleted

## 2012-08-24 DIAGNOSIS — E559 Vitamin D deficiency, unspecified: Secondary | ICD-10-CM

## 2012-08-24 MED ORDER — VITAMIN D (ERGOCALCIFEROL) 1.25 MG (50000 UNIT) PO CAPS: 50000.0000 [IU] | ORAL_CAPSULE | ORAL | Status: DC

## 2012-08-24 NOTE — Telephone Encounter (Signed)
Rx for Vit D 50, 000 units [facility] [place], pt made aware along with detailed message with lab results

## 2012-09-06 ENCOUNTER — Ambulatory Visit
Admission: RE | Admit: 2012-09-06 | Discharge: 2012-09-06 | Disposition: A | Discharge status: Home / Self Care | Payer: Medicare Other | Source: Ambulatory Visit | Attending: Family Medicine | Admitting: Family Medicine

## 2012-09-06 DIAGNOSIS — Z1231 Encounter for screening mammogram for malignant neoplasm of breast: Secondary | ICD-10-CM

## 2012-10-19 ENCOUNTER — Ambulatory Visit: Payer: Medicare Other | Admitting: Endocrinology

## 2012-10-19 DIAGNOSIS — Z0289 Encounter for other administrative examinations: Secondary | ICD-10-CM

## 2012-10-20 ENCOUNTER — Other Ambulatory Visit: Payer: Self-pay | Admitting: Family Medicine

## 2012-10-23 NOTE — Telephone Encounter (Signed)
[facility]. SW, CMA 

## 2012-11-07 ENCOUNTER — Other Ambulatory Visit: Payer: Self-pay

## 2012-11-07 MED ORDER — LEVOTHYROXINE SODIUM 100 MCG PO TABS: 100.0000 ug | ORAL_TABLET | Freq: Every day | ORAL | Status: DC

## 2012-12-05 ENCOUNTER — Ambulatory Visit: Payer: Medicare Other

## 2012-12-06 ENCOUNTER — Ambulatory Visit (INDEPENDENT_AMBULATORY_CARE_PROVIDER_SITE_OTHER): Payer: Medicare Other | Admitting: *Deleted

## 2012-12-06 DIAGNOSIS — Z23 Encounter for immunization: Secondary | ICD-10-CM

## 2012-12-25 ENCOUNTER — Other Ambulatory Visit: Payer: Self-pay | Admitting: *Deleted

## 2012-12-25 MED ORDER — LEVOTHYROXINE SODIUM 100 MCG PO TABS: 100.0000 ug | ORAL_TABLET | Freq: Every day | ORAL | Status: DC

## 2012-12-26 ENCOUNTER — Other Ambulatory Visit: Payer: Self-pay | Admitting: General Practice

## 2012-12-26 MED ORDER — EZETIMIBE 10 MG PO TABS: ORAL_TABLET | ORAL | Status: DC

## 2013-02-13 ENCOUNTER — Encounter: Payer: Self-pay | Admitting: Endocrinology

## 2013-02-13 ENCOUNTER — Ambulatory Visit (INDEPENDENT_AMBULATORY_CARE_PROVIDER_SITE_OTHER): Payer: Medicare Other | Admitting: Endocrinology

## 2013-02-13 VITALS — BP 120/78 | HR 82 | Temp 98.2°F | Ht 63.0 in | Wt 207.0 lb

## 2013-02-13 DIAGNOSIS — E042 Nontoxic multinodular goiter: Secondary | ICD-10-CM

## 2013-02-13 DIAGNOSIS — E890 Postprocedural hypothyroidism: Secondary | ICD-10-CM

## 2013-02-13 LAB — TSH: TSH: 4.38 u[IU]/mL (ref 0.35–5.50)

## 2013-02-13 NOTE — Patient Instructions (Signed)
blood tests are being requested for you today.  We'll contact you with results. Let's recheck the ultrasound.  you will receive a phone call, about a day and time for an appointment. Please come back for a follow-up appointment in 6 months. most of the time, a "lumpy thyroid" will eventually become overactive again.

## 2013-02-13 NOTE — Progress Notes (Signed)
   Subjective:    Patient ID: Tonya Johns, female    DOB: Oct 04, 1930, 80 y.o.   MRN: [id number]  HPI Pt had 131 rx, for hyperthyroidism due to multinodular goiter, in late 2012.  pt states she feels well in general.  She takes synthroid as rx'ed.   Past Medical History  Diagnosis Date  . Arthritis   . Diverticulitis   . GERD (gastroesophageal reflux disease)   . Hyperlipidemia   . Osteoporosis   . Leg swelling     No past surgical history on file.  History   Social History  . Marital Status: Single    Spouse Name: N/A    Number of Children: N/A  . Years of Education: N/A   Occupational History  . Not on file.   Social History Main Topics  . Smoking status: Never Smoker   . Smokeless tobacco: Not on file  . Alcohol Use: No  . Drug Use: No  . Sexual Activity:    Other Topics Concern  . Not on file   Social History Narrative  . No narrative on file    Current Outpatient Prescriptions on File Prior to Visit  Medication Sig Dispense Refill  . Coenzyme Q10 (CO Q10) 100 MG CAPS Take 100 mg by mouth daily.        Marland Kitchen. levothyroxine (SYNTHROID, LEVOTHROID) 100 MCG tablet Take 1 tablet (100 mcg total) by mouth daily.  90 tablet  1  . sertraline (ZOLOFT) 50 MG tablet Take 1 tablet (50 mg total) by mouth daily.  30 tablet  6   No current facility-administered medications on file prior to visit.   Allergies  Allergen Reactions  . Penicillins Rash   Family History  Problem Relation Age of Onset  . Alcohol abuse Brother   . Alcohol abuse Sister   . Arthritis Mother   . Arthritis Father   . Hyperlipidemia Brother   . Hyperlipidemia Sister   . Heart disease Father   . Stroke Mother   . Hypertension Mother   . Sudden death Brother   . Mental illness Sister   . Diabetes Sister    BP 120/78  Pulse 82  Temp(Src) 98.2 F (36.8 C) (Oral)  Ht 5\' 3"  (1.6 m)  Wt 207 lb (93.895 kg)  BMI 36.68 kg/m2  SpO2 98%  Review of Systems She has weight gain    Objective:   Physical Exam VITAL SIGNS:  See vs page GENERAL: no distress Neck: ? Of 4 cm right thyroid nodule  Lab Results  Component Value Date   TSH 4.38 02/13/2013      Assessment & Plan:  Hypothyroidism, well-replaced Goiter, ? bigger

## 2013-02-14 ENCOUNTER — Encounter: Payer: Self-pay | Admitting: General Practice

## 2013-02-14 ENCOUNTER — Ambulatory Visit (INDEPENDENT_AMBULATORY_CARE_PROVIDER_SITE_OTHER): Payer: Medicare Other | Admitting: Family Medicine

## 2013-02-14 ENCOUNTER — Encounter: Payer: Self-pay | Admitting: Family Medicine

## 2013-02-14 VITALS — BP 136/76 | HR 66 | Temp 98.4°F | Resp 17 | Wt 207.2 lb

## 2013-02-14 DIAGNOSIS — M25562 Pain in left knee: Secondary | ICD-10-CM

## 2013-02-14 DIAGNOSIS — M25569 Pain in unspecified knee: Secondary | ICD-10-CM

## 2013-02-14 DIAGNOSIS — E785 Hyperlipidemia, unspecified: Secondary | ICD-10-CM

## 2013-02-14 DIAGNOSIS — M25561 Pain in right knee: Secondary | ICD-10-CM

## 2013-02-14 LAB — BASIC METABOLIC PANEL
BUN: 21 mg/dL (ref 6–23)
CALCIUM: 9.3 mg/dL (ref 8.4–10.5)
CHLORIDE: 104 meq/L (ref 96–112)
CO2: 28 mEq/L (ref 19–32)
CREATININE: 1.3 mg/dL — AB (ref 0.4–1.2)
GFR: 51.5 mL/min — ABNORMAL LOW (ref >60.00)
Glucose, Bld: 101 mg/dL — ABNORMAL HIGH (ref 70–99)
Potassium: 4.1 mEq/L (ref 3.5–5.1)
Sodium: 138 mEq/L (ref 135–145)

## 2013-02-14 LAB — HEPATIC FUNCTION PANEL
ALT: 18 U/L (ref 0–35)
AST: 19 U/L (ref 0–37)
Albumin: 3.2 g/dL — ABNORMAL LOW (ref 3.5–5.2)
Alkaline Phosphatase: 86 U/L (ref 39–117)
BILIRUBIN TOTAL: 0.6 mg/dL (ref 0.3–1.2)
Bilirubin, Direct: 0 mg/dL (ref 0.0–0.3)
Total Protein: 6.8 g/dL (ref 6.0–8.3)

## 2013-02-14 LAB — LIPID PANEL
CHOL/HDL RATIO: 5
Cholesterol: 263 mg/dL — ABNORMAL HIGH (ref 0–200)
HDL: 55.6 mg/dL (ref >39.00)
TRIGLYCERIDES: 109 mg/dL (ref 0.0–149.0)
VLDL: 21.8 mg/dL (ref 0.0–40.0)

## 2013-02-14 LAB — LDL CHOLESTEROL, DIRECT: LDL DIRECT: 195.4 mg/dL

## 2013-02-14 MED ORDER — PRAVASTATIN SODIUM 40 MG PO TABS: 40.0000 mg | ORAL_TABLET | Freq: Every day | ORAL | Status: DC

## 2013-02-14 NOTE — Progress Notes (Signed)
   Subjective:    Patient ID: Tonya Johns, female    DOB: 12-11-1930, 80 y.o.   MRN: [id number]  HPI Hyperlipidemia- chronic problem.  On Zetia.  Insurance told her to get 3 month supply and that would be $300.  Pt was intolerant to Lipitor due to cough.  No CP, SOB, HAs, visual changes, N/V/D, myalgias.  Knee pain- stopped NSAIDs due to elevated Cr but never returned for f/u BMP.  Review of Systems For ROS see HPI     Objective:   Physical Exam  Vitals reviewed. Constitutional: She is oriented to person, place, and time. She appears well-developed and well-nourished. No distress.  HENT:  Head: Normocephalic and atraumatic.  Eyes: Conjunctivae and EOM are normal. Pupils are equal, round, and reactive to light.  Neck: Normal range of motion. Neck supple. No thyromegaly present.  Cardiovascular: Normal rate, regular rhythm and intact distal pulses.   Murmur (I-II/VI SEM) heard. Pulmonary/Chest: Effort normal and breath sounds normal. No respiratory distress.  Abdominal: Soft. She exhibits no distension. There is no tenderness.  Musculoskeletal: She exhibits no edema.  Lymphadenopathy:    She has no cervical adenopathy.  Neurological: She is alert and oriented to person, place, and time.  Skin: Skin is warm and dry.  Psychiatric: She has a normal mood and affect. Her behavior is normal.          Assessment & Plan:

## 2013-02-14 NOTE — Assessment & Plan Note (Signed)
Pt doing well on Zetia but the cost is posing a problem for her.  Has been intolerant to Lipitor and Zocor previously- will try Pravastatin in hopes that pt doesn't have side effects.  Will follow closely.

## 2013-02-14 NOTE — Progress Notes (Signed)
Pre visit review using our clinic review tool, if applicable. No additional management support is needed unless otherwise documented below in the visit note. 

## 2013-02-14 NOTE — Patient Instructions (Signed)
Schedule your complete physical in 6 months Call and let me know if the Pravastatin is ok after 3-4 weeks so we can send a prescription to mail order We'll notify you of your lab results and make any changes if needed Tylenol arthritis as needed for the knee pain Call with any questions or concerns Happy New Year!

## 2013-02-14 NOTE — Assessment & Plan Note (Signed)
New.  Pt to take Tylenol arthritis prn.  Repeat BMP to see if pt can restart NSAIDs.  Will follow.

## 2013-03-18 ENCOUNTER — Ambulatory Visit
Admission: RE | Admit: 2013-03-18 | Discharge: 2013-03-18 | Disposition: A | Discharge status: Home / Self Care | Payer: Medicare Other | Source: Ambulatory Visit | Attending: Endocrinology | Admitting: Endocrinology

## 2013-03-18 DIAGNOSIS — E042 Nontoxic multinodular goiter: Secondary | ICD-10-CM

## 2013-06-04 ENCOUNTER — Other Ambulatory Visit: Payer: Self-pay | Admitting: Family Medicine

## 2013-06-04 NOTE — Telephone Encounter (Signed)
Med filled.  

## 2013-08-12 ENCOUNTER — Ambulatory Visit: Payer: Medicare Other | Admitting: Endocrinology

## 2013-08-16 ENCOUNTER — Encounter: Payer: Self-pay | Admitting: Family Medicine

## 2013-08-16 ENCOUNTER — Ambulatory Visit (INDEPENDENT_AMBULATORY_CARE_PROVIDER_SITE_OTHER): Payer: Medicare Other | Admitting: Family Medicine

## 2013-08-16 ENCOUNTER — Other Ambulatory Visit: Payer: Self-pay | Admitting: General Practice

## 2013-08-16 VITALS — BP 128/78 | HR 64 | Temp 98.0°F | Resp 16 | Ht 63.75 in | Wt 201.2 lb

## 2013-08-16 DIAGNOSIS — M810 Age-related osteoporosis without current pathological fracture: Secondary | ICD-10-CM

## 2013-08-16 DIAGNOSIS — Z1231 Encounter for screening mammogram for malignant neoplasm of breast: Secondary | ICD-10-CM

## 2013-08-16 DIAGNOSIS — E890 Postprocedural hypothyroidism: Secondary | ICD-10-CM

## 2013-08-16 DIAGNOSIS — E785 Hyperlipidemia, unspecified: Secondary | ICD-10-CM

## 2013-08-16 DIAGNOSIS — Z0000 Encounter for general adult medical examination without abnormal findings: Secondary | ICD-10-CM

## 2013-08-16 LAB — BASIC METABOLIC PANEL
BUN: 20 mg/dL (ref 6–23)
CO2: 26 mEq/L (ref 19–32)
CREATININE: 1.4 mg/dL — AB (ref 0.4–1.2)
Calcium: 9.6 mg/dL (ref 8.4–10.5)
Chloride: 102 mEq/L (ref 96–112)
GFR: 47.16 mL/min — ABNORMAL LOW (ref >60.00)
Glucose, Bld: 109 mg/dL — ABNORMAL HIGH (ref 70–99)
Potassium: 3.8 mEq/L (ref 3.5–5.1)
Sodium: 136 mEq/L (ref 135–145)

## 2013-08-16 LAB — LIPID PANEL
CHOL/HDL RATIO: 6
Cholesterol: 286 mg/dL — ABNORMAL HIGH (ref 0–200)
HDL: 49.8 mg/dL (ref >39.00)
LDL Cholesterol: 203 mg/dL — ABNORMAL HIGH (ref 0–99)
NonHDL: 236.2
TRIGLYCERIDES: 168 mg/dL — AB (ref 0.0–149.0)
VLDL: 33.6 mg/dL (ref 0.0–40.0)

## 2013-08-16 LAB — HEPATIC FUNCTION PANEL
ALBUMIN: 3.2 g/dL — AB (ref 3.5–5.2)
ALT: 17 U/L (ref 0–35)
AST: 20 U/L (ref 0–37)
Alkaline Phosphatase: 79 U/L (ref 39–117)
Bilirubin, Direct: 0 mg/dL (ref 0.0–0.3)
Total Bilirubin: 0.2 mg/dL (ref 0.2–1.2)
Total Protein: 7.1 g/dL (ref 6.0–8.3)

## 2013-08-16 LAB — CBC WITH DIFFERENTIAL/PLATELET
Basophils Absolute: 0 10*3/uL (ref 0.0–0.1)
Basophils Relative: 0.6 % (ref 0.0–3.0)
EOS PCT: 2.1 % (ref 0.0–5.0)
Eosinophils Absolute: 0.2 10*3/uL (ref 0.0–0.7)
HEMATOCRIT: 35.5 % — AB (ref 36.0–46.0)
Hemoglobin: 11.6 g/dL — ABNORMAL LOW (ref 12.0–15.0)
Lymphocytes Relative: 28.5 % (ref 12.0–46.0)
Lymphs Abs: 2.1 10*3/uL (ref 0.7–4.0)
MCHC: 32.7 g/dL (ref 30.0–36.0)
MCV: 94.3 fl (ref 78.0–100.0)
MONOS PCT: 4.7 % (ref 3.0–12.0)
Monocytes Absolute: 0.4 10*3/uL (ref 0.1–1.0)
NEUTROS PCT: 64.1 % (ref 43.0–77.0)
Neutro Abs: 4.8 10*3/uL (ref 1.4–7.7)
PLATELETS: 266 10*3/uL (ref 150.0–400.0)
RBC: 3.77 Mil/uL — ABNORMAL LOW (ref 3.87–5.11)
RDW: 14.5 % (ref 11.5–15.5)
WBC: 7.5 10*3/uL (ref 4.0–10.5)

## 2013-08-16 LAB — TSH: TSH: 5.26 u[IU]/mL — AB (ref 0.35–4.50)

## 2013-08-16 LAB — VITAMIN D 25 HYDROXY (VIT D DEFICIENCY, FRACTURES): VITD: 42.61 ng/mL

## 2013-08-16 MED ORDER — ROSUVASTATIN CALCIUM 20 MG PO TABS: 20.0000 mg | ORAL_TABLET | Freq: Every day | ORAL | Status: DC

## 2013-08-16 NOTE — Progress Notes (Signed)
   Subjective:    Patient ID: Tonya Johns, female    DOB: January 04, 1931, 80 y.o.   MRN: [id number]  HPI Here today for CPE.  Ortho- [facility] Althe[facility]([place])  EndoEverardo All- [place]  Risk Factors: Hyperlipidemia- chronic problem, on Pravastatin.  No abd pain, N/V, myalgias Hypothyroid- seeing Dr Everardo AllEllison, has appt next week Osteoporosis- due for DEXA this summer.  No hx of fractures.   Physical Activity: gardening, cutting grass Fall Risk: low risk Depression: denies current symptoms Hearing: normal to conversational tones and whispered voice ADL's: independent Cognitive: normal linear thought process, memory and attention intact Home Safety: lives w/ son, 'very safe'. Height, Weight, BMI, Visual Acuity: see vitals, vision corrected to 20/20 w/ glasses Counseling: UTD on colonoscopy, due for DEXA and mammo in August.  No need for paps Labs Ordered: See A&P Care Plan: See A&P    Review of Systems Patient reports no vision/ hearing changes, adenopathy,fever, weight change,  persistant/recurrent hoarseness , swallowing issues, chest pain, palpitations, edema, persistant/recurrent cough, hemoptysis, dyspnea (rest/exertional/paroxysmal nocturnal), gastrointestinal bleeding (melena, rectal bleeding), abdominal pain, significant heartburn, bowel changes, GU symptoms (dysuria, hematuria, incontinence), Gyn symptoms (abnormal  bleeding, pain),  syncope, focal weakness, memory loss, numbness & tingling, skin/hair/nail changes, abnormal bruising or bleeding, anxiety, or depression.     Objective:   Physical Exam General Appearance:    Alert, cooperative, no distress, appears stated age  Head:    Normocephalic, without obvious abnormality, atraumatic  Eyes:    PERRL, conjunctiva/corneas clear, EOM's intact, fundi    benign, both eyes  Ears:    Normal TM's and external ear canals, both ears  Nose:   Nares normal, septum midline, mucosa normal, no drainage    or sinus tenderness  Throat:   Lips, mucosa, and  tongue normal; teeth and gums normal  Neck:   Supple, symmetrical, trachea midline, no adenopathy;    Thyroid: no enlargement/tenderness/nodules  Back:     Symmetric, no curvature, ROM normal, no CVA tenderness  Lungs:     Clear to auscultation bilaterally, respirations unlabored  Chest Wall:    No tenderness or deformity   Heart:    Regular rate and rhythm, S1 and S2 normal, no murmur, rub   or gallop  Breast Exam:    Deferred to mammo  Abdomen:     Soft, non-tender, bowel sounds active all four quadrants,    no masses, no organomegaly  Genitalia:    Deferred at pt's request  Rectal:    Extremities:   Extremities normal, atraumatic, no cyanosis or edema  Pulses:   2+ and symmetric all extremities  Skin:   Skin color, texture, turgor normal, no rashes or lesions  Lymph nodes:   Cervical, supraclavicular, and axillary nodes normal  Neurologic:   CNII-XII intact, normal strength, sensation and reflexes    throughout          Assessment & Plan:

## 2013-08-16 NOTE — Assessment & Plan Note (Signed)
Chronic problem.  Due for repeat DEXA- order entered.  Check Vit D level.

## 2013-08-16 NOTE — Progress Notes (Signed)
Pre visit review using our clinic review tool, if applicable. No additional management support is needed unless otherwise documented below in the visit note. 

## 2013-08-16 NOTE — Patient Instructions (Signed)
Follow up in 6 months to recheck the cholesterol- sooner if needed Keep up the good work!  You look great! We'll notify you of your lab results and make any changes if needed We'll call you with your bone density and mammo appt Call with any questions or concerns Enjoy the rest of your summer!!!

## 2013-08-16 NOTE — Assessment & Plan Note (Signed)
Pt's PE WNL.  Due for DEXA and mammo- referrals entered.  UTD on colonoscopy.  No need for paps.  Check labs.  Anticipatory guidance provided.  Written screening schedule updated and given to pt.

## 2013-08-16 NOTE — Assessment & Plan Note (Signed)
Chronic problem.  Following w/ Dr Everardo AllEllison.  Check TSH so this will be available at time of appt next week.

## 2013-08-16 NOTE — Assessment & Plan Note (Signed)
Chronic problem.  Tolerating Pravachol w/o difficulty.  Check labs.  Adjust meds prn

## 2013-08-19 ENCOUNTER — Telehealth: Payer: Self-pay | Admitting: *Deleted

## 2013-08-19 MED ORDER — PRAVASTATIN SODIUM 80 MG PO TABS: 80.0000 mg | ORAL_TABLET | Freq: Every day | ORAL | Status: DC

## 2013-08-19 NOTE — Telephone Encounter (Signed)
Med filled and pt notified.  

## 2013-08-19 NOTE — Telephone Encounter (Signed)
Caller name:  Leshae Relation to pt:  self Call back number: [phone]  Pharmacy:  Reason for call:  Pt came by office today.  Went to pick up prescription of rosuvastatin (CRESTOR) 20 MG tablet, and her cost was $200.  She can not afford that.  Pt wants to know if she can take 1 and 1/2 tablets of levothyroxine (SYNTHROID, LEVOTHROID) 100 MCG tablet, she just refilled this last week.  Please advise pt if that is OK, or what you recommend.  Please advise.

## 2013-08-19 NOTE — Telephone Encounter (Signed)
Pt cannot just take 1 1/2 tabs of Synthroid b/c that dose (150mcg) would be too high.  She really should pick up the new prescription.  If she just got the med filled (and it's for 30- NOT 90) she can take those pills and then start the new prescription. She can increase the Pravastatin to 80mg  rather than starting the Crestor- but will need LFTs in 6-8 weeks to ensure that her liver remains normal

## 2013-08-20 ENCOUNTER — Ambulatory Visit: Payer: Medicare Other | Admitting: Endocrinology

## 2013-08-21 ENCOUNTER — Ambulatory Visit (INDEPENDENT_AMBULATORY_CARE_PROVIDER_SITE_OTHER): Payer: Medicare Other | Admitting: Endocrinology

## 2013-08-21 ENCOUNTER — Encounter: Payer: Self-pay | Admitting: Endocrinology

## 2013-08-21 VITALS — BP 122/64 | HR 67 | Temp 97.9°F | Ht 63.75 in | Wt 200.0 lb

## 2013-08-21 DIAGNOSIS — E890 Postprocedural hypothyroidism: Secondary | ICD-10-CM

## 2013-08-21 NOTE — Progress Notes (Signed)
Subjective:    Patient ID: Tonya Johns, female    DOB: 25-Feb-1930, 80 y.o.   MRN: [id number]  HPI Pt returns for f/u of post-I-131 hypothyroidism (she had 131 rx, for hyperthyroidism due to multinodular goiter, in late 2012;  US in early 2015 was only minimally changed).  At the time of the recent TSH, she had missed at least 4 days of synthroid.  Past Medical History  Diagnosis Date  . Arthritis   . Diverticulitis   . GERD (gastroesophageal reflux disease)   . Hyperlipidemia   . Osteoporosis   . Leg swelling     No past surgical history on file.  History   Social History  . Marital Status: Single    Spouse Name: N/A    Number of Children: N/A  . Years of Education: N/A   Occupational History  . Not on file.   Social History Main Topics  . Smoking status: Never Smoker   . Smokeless tobacco: Not on file  . Alcohol Use: No  . Drug Use: No  . Sexual Activity:    Other Topics Concern  . Not on file   Social History Narrative  . No narrative on file    Current Outpatient Prescriptions on File Prior to Visit  Medication Sig Dispense Refill  . Coenzyme Q10 (CO Q10) 100 MG CAPS Take 100 mg by mouth daily.        Marland Kitchen. levothyroxine (SYNTHROID, LEVOTHROID) 100 MCG tablet Take 1 tablet (100 mcg total) by mouth daily.  90 tablet  1  . Multiple Vitamins-Minerals (MULTIVITAMIN PO) Take 1 tablet by mouth daily.      . pravastatin (PRAVACHOL) 80 MG tablet Take 1 tablet (80 mg total) by mouth daily.  90 tablet  3  . rosuvastatin (CRESTOR) 20 MG tablet Take 1 tablet (20 mg total) by mouth daily.  30 tablet  3   No current facility-administered medications on file prior to visit.    Allergies  Allergen Reactions  . Penicillins Rash    Family History  Problem Relation Age of Onset  . Alcohol abuse Brother   . Alcohol abuse Sister   . Arthritis Mother   . Arthritis Father   . Hyperlipidemia Brother   . Hyperlipidemia Sister   . Heart disease Father   . Stroke Mother   .  Hypertension Mother   . Sudden death Brother   . Mental illness Sister   . Diabetes Sister     BP 122/64  Pulse 67  Temp(Src) 97.9 F (36.6 C) (Oral)  Ht 5' 3.75" (1.619 m)  Wt 200 lb (90.719 kg)  BMI 34.61 kg/m2  SpO2 97%    Review of Systems Denies weight change    Objective:   Physical Exam VITAL SIGNS:  See vs page GENERAL: no distress ? Of large right nodule is again noted   Lab Results  Component Value Date   TSH 5.26* 08/16/2013       Assessment & Plan:  Multinodular goiter: The large right nodule is refuted by US earlier this year.   Hypothyroidism: therapy limited by noncompliance.  i'll do the best i can.  Patient is advised the following: Patient Instructions  In 1 month, please redo the thyroid blood test at the [facility], [place].  We'll contact you with results. Please come back for a follow-up appointment in 6 months. most of the time, a "lumpy thyroid" will eventually become overactive again.

## 2013-08-21 NOTE — Patient Instructions (Addendum)
In 1 month, please redo the thyroid blood test at the [facility], [place].  We'll contact you with results. Please come back for a follow-up appointment in 6 months. most of the time, a "lumpy thyroid" will eventually become overactive again.

## 2013-09-08 ENCOUNTER — Other Ambulatory Visit: Payer: Self-pay | Admitting: Endocrinology

## 2013-09-12 ENCOUNTER — Ambulatory Visit
Admission: RE | Admit: 2013-09-12 | Discharge: 2013-09-12 | Disposition: A | Discharge status: Home / Self Care | Payer: Medicare Other | Source: Ambulatory Visit | Attending: Family Medicine | Admitting: Family Medicine

## 2013-09-12 ENCOUNTER — Inpatient Hospital Stay: Admission: RE | Admit: 2013-09-12 | Payer: Medicare Other | Source: Ambulatory Visit

## 2013-09-12 ENCOUNTER — Ambulatory Visit: Payer: Medicare Other

## 2013-09-12 DIAGNOSIS — M810 Age-related osteoporosis without current pathological fracture: Secondary | ICD-10-CM

## 2013-09-12 DIAGNOSIS — Z1231 Encounter for screening mammogram for malignant neoplasm of breast: Secondary | ICD-10-CM

## 2013-09-12 LAB — HM DEXA SCAN: HM DEXA SCAN: NORMAL

## 2013-09-13 ENCOUNTER — Encounter: Payer: Self-pay | Admitting: General Practice

## 2013-09-19 ENCOUNTER — Encounter: Payer: Self-pay | Admitting: General Practice

## 2013-11-07 ENCOUNTER — Ambulatory Visit (INDEPENDENT_AMBULATORY_CARE_PROVIDER_SITE_OTHER): Payer: Medicare Other

## 2013-11-07 DIAGNOSIS — Z23 Encounter for immunization: Secondary | ICD-10-CM

## 2014-02-19 ENCOUNTER — Encounter: Payer: Self-pay | Admitting: Family Medicine

## 2014-02-19 ENCOUNTER — Encounter: Payer: Self-pay | Admitting: General Practice

## 2014-02-19 ENCOUNTER — Ambulatory Visit (INDEPENDENT_AMBULATORY_CARE_PROVIDER_SITE_OTHER): Payer: Medicare Other | Admitting: Family Medicine

## 2014-02-19 ENCOUNTER — Other Ambulatory Visit: Payer: Self-pay | Admitting: Endocrinology

## 2014-02-19 VITALS — BP 120/70 | HR 67 | Temp 97.8°F | Resp 16 | Wt 198.1 lb

## 2014-02-19 DIAGNOSIS — E785 Hyperlipidemia, unspecified: Secondary | ICD-10-CM | POA: Diagnosis not present

## 2014-02-19 DIAGNOSIS — N814 Uterovaginal prolapse, unspecified: Secondary | ICD-10-CM | POA: Insufficient documentation

## 2014-02-19 DIAGNOSIS — Z23 Encounter for immunization: Secondary | ICD-10-CM

## 2014-02-19 LAB — BASIC METABOLIC PANEL
BUN: 24 mg/dL — ABNORMAL HIGH (ref 6–23)
CHLORIDE: 104 meq/L (ref 96–112)
CO2: 29 meq/L (ref 19–32)
Calcium: 9.4 mg/dL (ref 8.4–10.5)
Creatinine, Ser: 1.23 mg/dL — ABNORMAL HIGH (ref 0.40–1.20)
GFR: 53.79 mL/min — ABNORMAL LOW (ref >60.00)
Glucose, Bld: 104 mg/dL — ABNORMAL HIGH (ref 70–99)
Potassium: 3.9 mEq/L (ref 3.5–5.1)
SODIUM: 138 meq/L (ref 135–145)

## 2014-02-19 LAB — HEPATIC FUNCTION PANEL
ALT: 14 U/L (ref 0–35)
AST: 17 U/L (ref 0–37)
Albumin: 3.8 g/dL (ref 3.5–5.2)
Alkaline Phosphatase: 88 U/L (ref 39–117)
BILIRUBIN DIRECT: 0.1 mg/dL (ref 0.0–0.3)
TOTAL PROTEIN: 6.7 g/dL (ref 6.0–8.3)
Total Bilirubin: 0.4 mg/dL (ref 0.2–1.2)

## 2014-02-19 LAB — LIPID PANEL
CHOL/HDL RATIO: 4
CHOLESTEROL: 195 mg/dL (ref 0–200)
HDL: 55.1 mg/dL (ref >39.00)
LDL Cholesterol: 117 mg/dL — ABNORMAL HIGH (ref 0–99)
NonHDL: 139.9
Triglycerides: 113 mg/dL (ref 0.0–149.0)
VLDL: 22.6 mg/dL (ref 0.0–40.0)

## 2014-02-19 MED ORDER — PRAVASTATIN SODIUM 80 MG PO TABS: 80.0000 mg | ORAL_TABLET | Freq: Every day | ORAL | Status: DC

## 2014-02-19 MED ORDER — LEVOTHYROXINE SODIUM 100 MCG PO TABS: 100.0000 ug | ORAL_TABLET | Freq: Every day | ORAL | Status: DC

## 2014-02-19 NOTE — Progress Notes (Signed)
Pre visit review using our clinic review tool, if applicable. No additional management support is needed unless otherwise documented below in the visit note. 

## 2014-02-19 NOTE — Progress Notes (Signed)
   Subjective:    Patient ID: Tonya Johns, female    DOB: 02-May-1930, 81 y.o.   MRN: [id number]  HPI Hyperlipidemia- chronic problem.  On Pravastatin- Crestor was too expensive.  Denies no abd pain, N/V, myalgias, CP, SOB, HAs, visual changes.  Bladder prolapse- pt reports she can 'feel something' when washing in the shower.  Having increased urgency.  Does not have GYN.   Review of Systems For ROS see HPI     Objective:   Physical Exam  Constitutional: She is oriented to person, place, and time. She appears well-developed and well-nourished. No distress.  HENT:  Head: Normocephalic and atraumatic.  Eyes: Conjunctivae and EOM are normal. Pupils are equal, round, and reactive to light.  Neck: Normal range of motion. Neck supple. No thyromegaly present.  Cardiovascular: Normal rate, regular rhythm, normal heart sounds and intact distal pulses.   No murmur heard. Pulmonary/Chest: Effort normal and breath sounds normal. No respiratory distress.  Abdominal: Soft. She exhibits no distension. There is no tenderness.  Genitourinary:  Deferred at pt's request  Musculoskeletal: She exhibits no edema.  Lymphadenopathy:    She has no cervical adenopathy.  Neurological: She is alert and oriented to person, place, and time.  Skin: Skin is warm and dry.  Psychiatric: She has a normal mood and affect. Her behavior is normal.  Vitals reviewed.         Assessment & Plan:

## 2014-02-19 NOTE — Assessment & Plan Note (Signed)
New.  Unclear if this is uterine of bladder b/c pt reports 'feeling something' while washing but declines GYN exam today.  Will refer to GYN for complete evaluation and possible pessary placement.  If GYN feels they are unable to assist pt, will refer to urology.  Pt expressed understanding and is in agreement w/ plan.

## 2014-02-19 NOTE — Assessment & Plan Note (Signed)
Chronic problem.  Pt's lipids are not at goal on Pravastatin but she has been intolerant to other meds in the past.  Encouraged healthy diet.  Will check labs.  Due to cost, unfortunately adding Zetia is probably not an option.  Will follow.

## 2014-02-19 NOTE — Addendum Note (Signed)
Addended by: Noreene LarssonLARSON, Sharlena Kristensen A on: 02/19/2014 08:55 AM   Modules accepted: Orders

## 2014-02-19 NOTE — Patient Instructions (Signed)
Schedule your complete physical in 6 months We'll notify you of your lab results and make any changes if needed We'll call you with your GYN appt for the prolapse Keep up the good work!  You look great! Call with any questions or concerns Happy New Year!!!

## 2014-02-21 ENCOUNTER — Ambulatory Visit: Payer: Medicare Other | Admitting: Endocrinology

## 2014-02-27 ENCOUNTER — Ambulatory Visit (INDEPENDENT_AMBULATORY_CARE_PROVIDER_SITE_OTHER): Payer: Medicare Other | Admitting: Endocrinology

## 2014-02-27 ENCOUNTER — Encounter: Payer: Self-pay | Admitting: Endocrinology

## 2014-02-27 VITALS — BP 132/82 | HR 75 | Temp 98.2°F | Ht 63.75 in | Wt 199.0 lb

## 2014-02-27 DIAGNOSIS — E890 Postprocedural hypothyroidism: Secondary | ICD-10-CM

## 2014-02-27 LAB — TSH: TSH: 0.76 u[IU]/mL (ref 0.35–4.50)

## 2014-02-27 NOTE — Patient Instructions (Addendum)
blood tests are being requested for you today.  We'll let you know about the results. Please come back for a follow-up appointment in 6 months.  most of the time, a "lumpy thyroid" will eventually become overactive again.

## 2014-02-27 NOTE — Progress Notes (Signed)
   Subjective:    Patient ID: Tonya Johns, female    DOB: 1931-01-18, 81 y.o.   MRN: [id number]  HPI Pt returns for f/u of post-I-131 hypothyroidism (she had 131 rx, for hyperthyroidism due to multinodular goiter, in late 2012; US in early 2015 was only minimally changed).  pt states she feels well in general. She does not notice the goiter.   Past Medical History  Diagnosis Date  . Arthritis   . Diverticulitis   . GERD (gastroesophageal reflux disease)   . Hyperlipidemia   . Osteoporosis   . Leg swelling     No past surgical history on file.  History   Social History  . Marital Status: Single    Spouse Name: N/A    Number of Children: N/A  . Years of Education: N/A   Occupational History  . Not on file.   Social History Main Topics  . Smoking status: Never Smoker   . Smokeless tobacco: Not on file  . Alcohol Use: No  . Drug Use: No  . Sexual Activity: Not on file   Other Topics Concern  . Not on file   Social History Narrative    Current Outpatient Prescriptions on File Prior to Visit  Medication Sig Dispense Refill  . Coenzyme Q10 (CO Q10) 100 MG CAPS Take 100 mg by mouth daily.      Marland Kitchen. levothyroxine (SYNTHROID, LEVOTHROID) 100 MCG tablet Take 1 tablet (100 mcg total) by mouth daily. 90 tablet 1  . Multiple Vitamins-Minerals (MULTIVITAMIN PO) Take 1 tablet by mouth daily.    . pravastatin (PRAVACHOL) 80 MG tablet Take 1 tablet (80 mg total) by mouth daily. 90 tablet 3   No current facility-administered medications on file prior to visit.    Allergies  Allergen Reactions  . Penicillins Rash    Family History  Problem Relation Age of Onset  . Alcohol abuse Brother   . Alcohol abuse Sister   . Arthritis Mother   . Arthritis Father   . Hyperlipidemia Brother   . Hyperlipidemia Sister   . Heart disease Father   . Stroke Mother   . Hypertension Mother   . Sudden death Brother   . Mental illness Sister   . Diabetes Sister     BP 132/82 mmHg  Pulse 75   Temp(Src) 98.2 F (36.8 C) (Oral)  Ht 5' 3.75" (1.619 m)  Wt 199 lb (90.266 kg)  BMI 34.44 kg/m2  SpO2 97%  Review of Systems Denies weight change    Objective:   Physical Exam VITAL SIGNS:  See vs page GENERAL: no distress ? Of large right nodule is again noted   Lab Results  Component Value Date   TSH 0.76 02/27/2014       Assessment & Plan:  Post-I-131 hypothyroidism, well-replaced  Patient is advised the following: Patient Instructions  blood tests are being requested for you today.  We'll let you know about the results. Please come back for a follow-up appointment in 6 months.  most of the time, a "lumpy thyroid" will eventually become overactive again.

## 2014-03-14 DIAGNOSIS — N8111 Cystocele, midline: Secondary | ICD-10-CM | POA: Diagnosis not present

## 2014-03-21 ENCOUNTER — Other Ambulatory Visit: Payer: Self-pay | Admitting: *Deleted

## 2014-03-21 ENCOUNTER — Telehealth: Payer: Self-pay | Admitting: Endocrinology

## 2014-03-21 MED ORDER — LEVOTHYROXINE SODIUM 100 MCG PO TABS: 100.0000 ug | ORAL_TABLET | Freq: Every day | ORAL | Status: DC

## 2014-03-21 NOTE — Telephone Encounter (Signed)
Please see below.

## 2014-03-21 NOTE — Telephone Encounter (Signed)
Pt called asking for refill on levothyroxine. [place] SpanielSaid it was filled at [facility] but she told them to put it back because she was waiting on it to come in mail order. She never got it in the mail order even thou the mail order ([facility]) says they shipped it out . She now wants another [facility] so she can have them resend it out because per pt the rx says they need another script. Please call pt.  Thanks Delice Bisonara

## 2014-03-21 NOTE — Telephone Encounter (Signed)
OK 

## 2014-03-21 NOTE — Telephone Encounter (Signed)
rx sent, patient aware 

## 2014-05-22 ENCOUNTER — Encounter: Payer: Self-pay | Admitting: Family Medicine

## 2014-05-22 ENCOUNTER — Ambulatory Visit (INDEPENDENT_AMBULATORY_CARE_PROVIDER_SITE_OTHER): Payer: Medicare Other | Admitting: Family Medicine

## 2014-05-22 VITALS — BP 126/84 | HR 82 | Temp 98.0°F | Resp 16 | Wt 201.5 lb

## 2014-05-22 DIAGNOSIS — R8290 Unspecified abnormal findings in urine: Secondary | ICD-10-CM | POA: Diagnosis not present

## 2014-05-22 DIAGNOSIS — R82998 Other abnormal findings in urine: Secondary | ICD-10-CM

## 2014-05-22 DIAGNOSIS — N390 Urinary tract infection, site not specified: Secondary | ICD-10-CM

## 2014-05-22 DIAGNOSIS — M545 Low back pain, unspecified: Secondary | ICD-10-CM

## 2014-05-22 LAB — POCT URINALYSIS DIPSTICK
BILIRUBIN UA: NEGATIVE
Blood, UA: NEGATIVE
Glucose, UA: NEGATIVE
Ketones, UA: NEGATIVE
Nitrite, UA: POSITIVE
PROTEIN UA: NEGATIVE
Spec Grav, UA: 1.025
Urobilinogen, UA: 0.2
pH, UA: 5.5

## 2014-05-22 MED ORDER — CEPHALEXIN 500 MG PO CAPS: 500.0000 mg | ORAL_CAPSULE | Freq: Two times a day (BID) | ORAL | Status: AC

## 2014-05-22 MED ORDER — TIZANIDINE HCL 2 MG PO TABS: 2.0000 mg | ORAL_TABLET | Freq: Three times a day (TID) | ORAL | Status: DC | PRN

## 2014-05-22 MED ORDER — DICLOFENAC SODIUM 1 % TD GEL: 4.0000 g | Freq: Four times a day (QID) | TRANSDERMAL | Status: DC

## 2014-05-22 NOTE — Patient Instructions (Addendum)
Follow up as needed- particularly if no improvement You appear to have a UTI- we're going to start Keflex for this.  (there is a 10% chance that this may react w/ your penicillin allergy- if you develop a rash or other concerns, stop the medicine immediately and call us) Start the Voltaren gel up to 4x/day as needed HEAT! Use the tizanidine as needed for muscle spasm Do some gentle stretching to avoid stiffness Call with any questions or concerns Hang in there!

## 2014-05-22 NOTE — Assessment & Plan Note (Signed)
New.  Pt w/o physical sxs but reports abnormal odor.  UA highly suggestive of infxn.  Start keflex.  Cautioned about possible cross reaction w/ PCN allergy.  Pt expressed understanding and is in agreement w/ plan.

## 2014-05-22 NOTE — Assessment & Plan Note (Signed)
New.  Suspect this is due to pt's recent gardening as I watched her bend at her waist to pick up something that dropped in office.  Discussed need to bend at the knees (although pt does have arthritis).  Start topical NSAIDs.  Heat.  Muscle relaxer prn.  Reviewed supportive care and red flags that should prompt return.  Pt expressed understanding and is in agreement w/ plan.

## 2014-05-22 NOTE — Progress Notes (Signed)
Pre visit review using our clinic review tool, if applicable. No additional management support is needed unless otherwise documented below in the visit note. 

## 2014-05-22 NOTE — Progress Notes (Signed)
   Subjective:    Patient ID: Tonya ManlyElma Johns, female    DOB: 11/03/1930, 81 y.o.   MRN: [id number]  HPI Back pain- sxs started 2 weeks ago.  Pt has known hx of arthritis in shoulders bilaterally, 'i think it left my shoulders and went to my back'.  Pain radiates across low back.  No radiation of pain into butt or legs.  Pt reports she was doing outdoor work prior to pain starting.  Pain improves w/ rest, worsens w/ activity.  No weakness or numbness of legs bilaterally.  No suprapubic pain.  + urine odor.  No burning or change in frequency.   Review of Systems For ROS see HPI     Objective:   Physical Exam  Constitutional: She is oriented to person, place, and time. She appears well-developed and well-nourished. No distress.  Cardiovascular: Intact distal pulses.   Abdominal: Soft. Bowel sounds are normal. There is no tenderness (no suprapubic or CVA tenderness).  Musculoskeletal: She exhibits no edema.  + TTP over lumbar paraspinal muscles bilaterally.  Full back flexion and extension  Neurological: She is alert and oriented to person, place, and time. She has normal reflexes. No cranial nerve deficit. Coordination normal.  Skin: Skin is warm and dry. No erythema.  Psychiatric: She has a normal mood and affect. Her behavior is normal. Thought content normal.  Vitals reviewed.         Assessment & Plan:

## 2014-05-25 LAB — URINE CULTURE

## 2014-05-26 ENCOUNTER — Telehealth: Payer: Self-pay | Admitting: *Deleted

## 2014-05-26 NOTE — Telephone Encounter (Signed)
Prior authorization for Voltaren gel initiated. Awaiting determination. JG//CMA  

## 2014-05-27 ENCOUNTER — Other Ambulatory Visit: Payer: Self-pay | Admitting: General Practice

## 2014-05-27 MED ORDER — NITROFURANTOIN MONOHYD MACRO 100 MG PO CAPS: 100.0000 mg | ORAL_CAPSULE | Freq: Two times a day (BID) | ORAL | Status: DC

## 2014-06-04 NOTE — Telephone Encounter (Signed)
Vickie from [facility] called and states that she needs the hospice admit date for this?? [phone] Fax [phone]

## 2014-06-04 NOTE — Telephone Encounter (Signed)
Ref# [id number]

## 2014-06-04 NOTE — Telephone Encounter (Signed)
Ref

## 2014-06-05 NOTE — Telephone Encounter (Signed)
[facility]. Approved at [facility] at 9:33am. Approved for 06/05/15. Best # [phone], requesting a callback so she can close out appeal.

## 2014-06-05 NOTE — Telephone Encounter (Signed)
[employer] called making sure Dr Beverely Lowabori was the MD on file for appeal

## 2014-06-05 NOTE — Telephone Encounter (Signed)
Called and informed [facility] that pt is not a hospice pt. They stated that the PA needed to go to the clinical dept for further review. Awaiting determination. JG//CMA

## 2014-09-04 ENCOUNTER — Telehealth: Payer: Self-pay | Admitting: Family Medicine

## 2014-09-04 NOTE — Telephone Encounter (Signed)
Noted, we will see pt for cholesterol follow up. Spoke with Helaine Chess and she will complete Pt Medicare Wellness afterwards.

## 2014-09-04 NOTE — Telephone Encounter (Signed)
FYI - Pt is scheduled 09/05/14 2:15pm for a follow up appt. Per AVS 02/19/14 pt was to schedule complete physical in 6 months.

## 2014-09-04 NOTE — Telephone Encounter (Signed)
Noted. Thanks.

## 2014-09-05 ENCOUNTER — Ambulatory Visit (INDEPENDENT_AMBULATORY_CARE_PROVIDER_SITE_OTHER): Payer: Medicare Other | Admitting: Family Medicine

## 2014-09-05 ENCOUNTER — Encounter: Payer: Self-pay | Admitting: Family Medicine

## 2014-09-05 VITALS — BP 130/82 | HR 76 | Temp 97.9°F | Resp 16 | Wt 201.1 lb

## 2014-09-05 DIAGNOSIS — E890 Postprocedural hypothyroidism: Secondary | ICD-10-CM | POA: Diagnosis not present

## 2014-09-05 DIAGNOSIS — M25562 Pain in left knee: Secondary | ICD-10-CM

## 2014-09-05 DIAGNOSIS — Z0000 Encounter for general adult medical examination without abnormal findings: Secondary | ICD-10-CM | POA: Diagnosis not present

## 2014-09-05 DIAGNOSIS — E785 Hyperlipidemia, unspecified: Secondary | ICD-10-CM | POA: Diagnosis not present

## 2014-09-05 DIAGNOSIS — M25561 Pain in right knee: Secondary | ICD-10-CM

## 2014-09-05 DIAGNOSIS — F32A Depression, unspecified: Secondary | ICD-10-CM

## 2014-09-05 DIAGNOSIS — F329 Major depressive disorder, single episode, unspecified: Secondary | ICD-10-CM | POA: Diagnosis not present

## 2014-09-05 LAB — BASIC METABOLIC PANEL
BUN: 16 mg/dL (ref 7–25)
CHLORIDE: 102 mmol/L (ref 98–110)
CO2: 23 mmol/L (ref 20–31)
Calcium: 9.8 mg/dL (ref 8.6–10.4)
Creat: 1.2 mg/dL — ABNORMAL HIGH (ref 0.60–0.88)
GLUCOSE: 90 mg/dL (ref 65–99)
Potassium: 4.3 mmol/L (ref 3.5–5.3)
Sodium: 139 mmol/L (ref 135–146)

## 2014-09-05 LAB — HEPATIC FUNCTION PANEL
ALT: 14 U/L (ref 6–29)
AST: 19 U/L (ref 10–35)
Albumin: 4 g/dL (ref 3.6–5.1)
Alkaline Phosphatase: 92 U/L (ref 33–130)
BILIRUBIN INDIRECT: 0.3 mg/dL (ref 0.2–1.2)
BILIRUBIN TOTAL: 0.4 mg/dL (ref 0.2–1.2)
Bilirubin, Direct: 0.1 mg/dL (ref <=0.2)
TOTAL PROTEIN: 6.9 g/dL (ref 6.1–8.1)

## 2014-09-05 LAB — LIPID PANEL
CHOL/HDL RATIO: 3.9 ratio (ref <=5.0)
CHOLESTEROL: 231 mg/dL — AB (ref 125–200)
HDL: 60 mg/dL (ref >=46)
LDL Cholesterol: 135 mg/dL — ABNORMAL HIGH (ref <130)
TRIGLYCERIDES: 179 mg/dL — AB (ref <150)
VLDL: 36 mg/dL — AB (ref <30)

## 2014-09-05 LAB — TSH: TSH: 1.329 u[IU]/mL (ref 0.350–4.500)

## 2014-09-05 MED ORDER — SERTRALINE HCL 50 MG PO TABS: 50.0000 mg | ORAL_TABLET | Freq: Every day | ORAL | Status: DC

## 2014-09-05 NOTE — Progress Notes (Signed)
   Subjective:    Patient ID: Tonya Johns, female    DOB: 06-29-1930, 81 y.o.   MRN: [id number]  HPI Hyperlipidemia- chronic problem, on Pravastatin daily.  Denies abd pain, N/V, myalgia.  No CP, SOB, HAs, visual changes, edema.  Hypothyroid- pt has appt upcoming w/ Dr Everardo All.  Will get labs today.  Denies fatigue, changes to skin/hair/nails.  Knee pain- bilateral, using voltaren gel w/ some temporary relief.  Has not seen Ortho in 2 yrs b/c last visit they told her to consider surgery ([facility]).  'i'll keep holding off a little longer'.  Depression- pt has hx of depression and was previously on Zoloft.  Has not taken in over 2 yrs.  Pt reports she is again feeling overwhelmed.  'there's a lot going on at the church'.  Family is doing well.  Pt reports that she is more irritable.  Not weepy.  Review of Systems For ROS see HPI     Objective:   Physical Exam  Constitutional: She is oriented to person, place, and time. She appears well-developed and well-nourished. No distress.  HENT:  Head: Normocephalic and atraumatic.  Eyes: Conjunctivae and EOM are normal. Pupils are equal, round, and reactive to light.  Neck: Normal range of motion. Neck supple.  Cardiovascular: Normal rate, regular rhythm and intact distal pulses.   Murmur (I-II/VI SEM) heard. Pulmonary/Chest: Effort normal and breath sounds normal. No respiratory distress.  Abdominal: Soft. She exhibits no distension. There is no tenderness.  Musculoskeletal: She exhibits no edema.  Lymphadenopathy:    She has no cervical adenopathy.  Neurological: She is alert and oriented to person, place, and time.  Skin: Skin is warm and dry.  Psychiatric: She has a normal mood and affect. Her behavior is normal.  Vitals reviewed.         Assessment & Plan:

## 2014-09-05 NOTE — Progress Notes (Addendum)
Subjective:   Tonya Johns is a 81 y.o. female who presents for Medicare Annual (Subsequent) preventive examination.  Review of Systems:  Sleep patterns: 6 hour every night    Fall Risk:  LOW Risk  Home Safety/Smoke Alarms:  Feels safe at home.  Lives with son who is disabled.  Smoke alarm and security system present.   Firearm Safety: No firearms Seat Belt Safety/Bike Helmet:  Always wears seat belt   Counseling:   Eye Exam- 2012, discussed scheduling an appointment    Dental-  2014- has dentures  Female:  Pap- deferred      Mammo-UTD     Dexa scan-UTD       CCS-UTD    Objective:     Vitals: BP 130/82 mmHg  Pulse 76  Temp(Src) 97.9 F (36.6 C) (Oral)  Resp 16  Wt 201 lb 2 oz (91.23 kg)  SpO2 97%  Tobacco History  Smoking status  . Never Smoker   Smokeless tobacco  . Not on file     Counseling given: Not Answered   Past Medical History  Diagnosis Date  . Arthritis   . Diverticulitis   . GERD (gastroesophageal reflux disease)   . Hyperlipidemia   . Osteoporosis   . Leg swelling    History reviewed. No pertinent past surgical history. Family History  Problem Relation Age of Onset  . Alcohol abuse Brother   . Alcohol abuse Sister   . Arthritis Mother   . Arthritis Father   . Hyperlipidemia Brother   . Hyperlipidemia Sister   . Heart disease Father   . Stroke Mother   . Hypertension Mother   . Sudden death Brother   . Mental illness Sister   . Diabetes Sister    History  Sexual Activity  . Sexual Activity: Not on file    Outpatient Encounter Prescriptions as of 09/05/2014  Medication Sig  . Coenzyme Q10 (CO Q10) 100 MG CAPS Take 100 mg by mouth daily.    . diclofenac sodium (VOLTAREN) 1 % GEL Apply 4 g topically 4 (four) times daily.  Marland Kitchen levothyroxine (SYNTHROID, LEVOTHROID) 100 MCG tablet Take 1 tablet (100 mcg total) by mouth daily.  . Multiple Vitamins-Minerals (MULTIVITAMIN PO) Take 1 tablet by mouth daily.  . pravastatin (PRAVACHOL) 80 MG  tablet Take 1 tablet (80 mg total) by mouth daily.  . sertraline (ZOLOFT) 50 MG tablet Take 1 tablet (50 mg total) by mouth daily.  . [DISCONTINUED] nitrofurantoin, macrocrystal-monohydrate, (MACROBID) 100 MG capsule Take 1 capsule (100 mg total) by mouth 2 (two) times daily.  . [DISCONTINUED] tiZANidine (ZANAFLEX) 2 MG tablet Take 1 tablet (2 mg total) by mouth every 8 (eight) hours as needed for muscle spasms.   No facility-administered encounter medications on file as of 09/05/2014.    Activities of Daily Living In your present state of health, do you have any difficulty performing the following activities: 09/05/2014 02/19/2014  Hearing? N N  Vision? Y N  Difficulty concentrating or making decisions? N N  Walking or climbing stairs? Y Y  Dressing or bathing? N N  Doing errands, shopping? N Y  Quarry manager and eating ? Y -  Using the Toilet? Y -  In the past six months, have you accidently leaked urine? N -  Do you have problems with loss of bowel control? N -  Managing your Medications? Y -  Managing your Finances? Y -  Housekeeping or managing your Housekeeping? Y -    Patient  Care Team: Sheliah Hatch, MD as PCP - General (Family Medicine) Romero Belling, MD as Consulting Physician (Endocrinology)    Assessment:   Exercise Activities and Dietary recommendations Current Exercise Habits:: Home exercise routine, Type of exercise: Other - see comments, Time (Minutes): 30, Frequency (Times/Week): 3, Weekly Exercise (Minutes/Week): 90  Goals    . 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      . Weight < 200 lb (90.719 kg)     Desires to lose 5-10 lbs by next year.    Plans to look into Silver Sneakers.   Healthy meals and snacks to include lean protein, fruits/veggies, whole grains, and limited sweets.        Fall Risk Fall Risk  09/05/2014 02/19/2014 02/14/2013 08/14/2012  Falls in the past year? No No No No    Depression Screen PHQ 2/9 Scores 09/05/2014 02/19/2014 02/14/2013  PHQ - 2 Score 1 0 0     Cognitive Testing MMSE - Mini Mental State Exam 09/05/2014  Orientation to time 5  Orientation to Place 5  Registration 3  Attention/ Calculation 5  Recall 2  Language- name 2 objects 2  Language- repeat 1  Language- follow 3 step command 3  Language- read & follow direction 1  Write a sentence 1  Copy design 1  Total score 29    Immunization History  Administered Date(s) Administered  . Influenza Split 11/25/2010, 11/16/2011  . Influenza,inj,Quad PF,36+ Mos 12/06/2012, 11/07/2013  . Pneumococcal Conjugate-13 02/19/2014   Screening Tests Health Maintenance  Topic Date Due  . INFLUENZA VACCINE  09/01/2014  . ZOSTAVAX  11/01/2014 (Originally 05/14/1992)  . TETANUS/TDAP  11/01/2014 (Originally 05/15/1949)  . PNA vac Low Risk Adult (2 of 2 - PPSV23) 02/20/2015  . COLONOSCOPY  02/15/2020  . DEXA SCAN  Completed      Plan:  Check with insurance company regarding the following vaccinations:  Tetanus/tdap and Zostavax  Keep follow up appointment with Dr. Beverely Low on 10/17/14 for recheck for mood.     During the course of the visit the patient was educated and counseled about the following appropriate screening and preventive services:   Vaccines to include Pneumoccal, Influenza, Hepatitis B, Td, Zostavax, HCV  Electrocardiogram  Cardiovascular Disease  Colorectal cancer screening  Bone density screening  Diabetes screening  Glaucoma screening  Mammography/PAP  Nutrition counseling   Patient Instructions (the written plan) was given to the patient.   [name], RN  09/10/2014

## 2014-09-05 NOTE — Progress Notes (Signed)
Pre visit review using our clinic review tool, if applicable. No additional management support is needed unless otherwise documented below in the visit note. 

## 2014-09-05 NOTE — Patient Instructions (Signed)
Follow up in 6 weeks to recheck mood We'll notify you of your lab results and make any changes if needed Start the Zoloft- 1/2 tab for 1 week and then increase to 1 tab daily Keep up the good work!  You look great! Let me know if you change your mind about Ortho for the knees! Call with any questions or concerns Enjoy the rest of your summer!!!

## 2014-09-07 NOTE — Assessment & Plan Note (Signed)
Recurrent problem.  Pt is not interested in returning to ortho at this time b/c they told her to return when she wanted surgery.  Will continue to follow.

## 2014-09-07 NOTE — Assessment & Plan Note (Signed)
Recurrent issue for pt.  It has been previously controlled since weaning off medication but pt reports that sxs have returned.  Will restart Zoloft and monitor for improvement.  Pt expressed understanding and is in agreement w/ plan.

## 2014-09-07 NOTE — Assessment & Plan Note (Signed)
Chronic problem.  Has appt upcoming w/ Dr Everardo All.  Will check labs so that he is able to adjust meds prn.

## 2014-09-07 NOTE — Assessment & Plan Note (Signed)
Chronic problem.  Pt is on Pravastatin and has been intolerant to Lipitor and Zocor previously.  Check labs.

## 2014-09-08 ENCOUNTER — Telehealth: Payer: Self-pay | Admitting: Family Medicine

## 2014-09-08 ENCOUNTER — Ambulatory Visit (INDEPENDENT_AMBULATORY_CARE_PROVIDER_SITE_OTHER): Payer: Medicare Other | Admitting: Endocrinology

## 2014-09-08 ENCOUNTER — Other Ambulatory Visit: Payer: Self-pay | Admitting: Family Medicine

## 2014-09-08 ENCOUNTER — Encounter: Payer: Self-pay | Admitting: Endocrinology

## 2014-09-08 VITALS — BP 134/76 | HR 78 | Temp 98.0°F | Ht 63.5 in | Wt 200.0 lb

## 2014-09-08 DIAGNOSIS — Z1231 Encounter for screening mammogram for malignant neoplasm of breast: Secondary | ICD-10-CM

## 2014-09-08 DIAGNOSIS — E042 Nontoxic multinodular goiter: Secondary | ICD-10-CM | POA: Diagnosis not present

## 2014-09-08 NOTE — Patient Instructions (Addendum)
Let's recheck the ultrasound.  We'll let you know about the results. Please continue the same thyroid medication.   Please come back for a follow-up appointment in 6-12 months.  most of the time, a "lumpy thyroid" will eventually become overactive again.

## 2014-09-08 NOTE — Progress Notes (Signed)
   Subjective:    Patient ID: Tonya Johns, female    DOB: Nov 14, 1930, 81 y.o.   MRN: [id number]  HPI Pt returns for f/u of post-I-131 hypothyroidism (she had 131 rx, for hyperthyroidism due to multinodular goiter, in late 2012; [place] in early 2015 was only minimally changed).  pt states she feels well in general, except for weight gain. She does not notice the goiter.   Past Medical History  Diagnosis Date  . Arthritis   . Diverticulitis   . GERD (gastroesophageal reflux disease)   . Hyperlipidemia   . Osteoporosis   . Leg swelling     No past surgical history on file.  History   Social History  . Marital Status: Single    Spouse Name: N/A  . Number of Children: N/A  . Years of Education: N/A   Occupational History  . Not on file.   Social History Main Topics  . Smoking status: Never Smoker   . Smokeless tobacco: Not on file  . Alcohol Use: No  . Drug Use: No  . Sexual Activity: Not on file   Other Topics Concern  . Not on file   Social History Narrative    Current Outpatient Prescriptions on File Prior to Visit  Medication Sig Dispense Refill  . Coenzyme Q10 (CO Q10) 100 MG CAPS Take 100 mg by mouth daily.      . diclofenac sodium (VOLTAREN) 1 % GEL Apply 4 g topically 4 (four) times daily. 100 g 3  . levothyroxine (SYNTHROID, LEVOTHROID) 100 MCG tablet Take 1 tablet (100 mcg total) by mouth daily. 90 tablet 1  . Multiple Vitamins-Minerals (MULTIVITAMIN PO) Take 1 tablet by mouth daily.    . pravastatin (PRAVACHOL) 80 MG tablet Take 1 tablet (80 mg total) by mouth daily. 90 tablet 3  . sertraline (ZOLOFT) 50 MG tablet Take 1 tablet (50 mg total) by mouth daily. 30 tablet 3   No current facility-administered medications on file prior to visit.    Allergies  Allergen Reactions  . Penicillins Rash    Family History  Problem Relation Age of Onset  . Alcohol abuse Brother   . Alcohol abuse Sister   . Arthritis Mother   . Arthritis Father   . Hyperlipidemia  Brother   . Hyperlipidemia Sister   . Heart disease Father   . Stroke Mother   . Hypertension Mother   . Sudden death Brother   . Mental illness Sister   . Diabetes Sister     BP 134/76 mmHg  Pulse 78  Temp(Src) 98 F (36.7 C) (Oral)  Ht 5' 3.5" (1.613 m)  Wt 200 lb (90.719 kg)  BMI 34.87 kg/m2  SpO2 94%    Review of Systems Denies neck pain    Objective:   Physical Exam VITAL SIGNS:  See vs page GENERAL: no distress Neck: small multinodular goiter, (R>L).    Lab Results  Component Value Date   TSH 1.329 09/05/2014      Assessment & Plan:  Post-I-131 hypothyroidism, well-replaced.   Multinodular goiter, clinically stable.   Patient is advised the following: Patient Instructions  Let's recheck the ultrasound.  We'll let you know about the results. Please continue the same thyroid medication.   Please come back for a follow-up appointment in 6-12 months.  most of the time, a "lumpy thyroid" will eventually become overactive again.

## 2014-09-08 NOTE — Telephone Encounter (Signed)
Caller name: Relation to ZO:XWRU Call back number:[phone] Pharmacy:  Reason for call:  Pt returning your call regarding lab results please call back

## 2014-09-08 NOTE — Telephone Encounter (Signed)
Pt notified of results

## 2014-09-17 ENCOUNTER — Ambulatory Visit
Admission: RE | Admit: 2014-09-17 | Discharge: 2014-09-17 | Disposition: A | Discharge status: Home / Self Care | Payer: Medicare Other | Source: Ambulatory Visit | Attending: Endocrinology | Admitting: Endocrinology

## 2014-09-17 DIAGNOSIS — E042 Nontoxic multinodular goiter: Secondary | ICD-10-CM | POA: Diagnosis not present

## 2014-09-22 ENCOUNTER — Ambulatory Visit (HOSPITAL_BASED_OUTPATIENT_CLINIC_OR_DEPARTMENT_OTHER)
Admission: RE | Admit: 2014-09-22 | Discharge: 2014-09-22 | Disposition: A | Discharge status: Home / Self Care | Payer: Medicare Other | Source: Ambulatory Visit | Attending: Family Medicine | Admitting: Family Medicine

## 2014-09-22 DIAGNOSIS — Z1231 Encounter for screening mammogram for malignant neoplasm of breast: Secondary | ICD-10-CM | POA: Insufficient documentation

## 2014-10-08 ENCOUNTER — Other Ambulatory Visit: Payer: Self-pay | Admitting: General Practice

## 2014-10-08 DIAGNOSIS — F329 Major depressive disorder, single episode, unspecified: Secondary | ICD-10-CM

## 2014-10-08 DIAGNOSIS — F32A Depression, unspecified: Secondary | ICD-10-CM

## 2014-10-08 MED ORDER — SERTRALINE HCL 50 MG PO TABS: 50.0000 mg | ORAL_TABLET | Freq: Every day | ORAL | Status: DC

## 2014-10-14 ENCOUNTER — Other Ambulatory Visit: Payer: Self-pay | Admitting: Endocrinology

## 2014-10-17 ENCOUNTER — Ambulatory Visit: Payer: Medicare Other | Admitting: Family Medicine

## 2014-10-23 ENCOUNTER — Ambulatory Visit (INDEPENDENT_AMBULATORY_CARE_PROVIDER_SITE_OTHER): Payer: Medicare Other | Admitting: Family Medicine

## 2014-10-23 ENCOUNTER — Encounter: Payer: Self-pay | Admitting: Family Medicine

## 2014-10-23 VITALS — BP 130/74 | HR 78 | Temp 98.1°F | Resp 16 | Ht 64.0 in | Wt 197.4 lb

## 2014-10-23 DIAGNOSIS — F32A Depression, unspecified: Secondary | ICD-10-CM

## 2014-10-23 DIAGNOSIS — Z23 Encounter for immunization: Secondary | ICD-10-CM | POA: Diagnosis not present

## 2014-10-23 DIAGNOSIS — E785 Hyperlipidemia, unspecified: Secondary | ICD-10-CM

## 2014-10-23 DIAGNOSIS — F329 Major depressive disorder, single episode, unspecified: Secondary | ICD-10-CM

## 2014-10-23 MED ORDER — LEVOTHYROXINE SODIUM 100 MCG PO TABS: ORAL_TABLET | ORAL | Status: DC

## 2014-10-23 MED ORDER — PRAVASTATIN SODIUM 80 MG PO TABS: 80.0000 mg | ORAL_TABLET | Freq: Every day | ORAL | Status: DC

## 2014-10-23 NOTE — Assessment & Plan Note (Signed)
Improved since starting the Zoloft despite current situation w/ her sister.  Pt's mood and ability to deal w/ the current situation are both better.  Still having difficulty w/ sleep.  Due to age, recommended Melatonin prior to starting a medication w/ more side effects.  Pt expressed understanding and is in agreement w/ plan.

## 2014-10-23 NOTE — Progress Notes (Signed)
   Subjective:    Patient ID: Tonya Johns, female    DOB: 1930/12/30, 81 y.o.   MRN: [id number]  HPI Depression- chronic problem, restarted Zoloft at last visit.  Pt reports sister is currently dying in Nursing Home at age 4.  Pt reports praying a lot.  Feels that despite her current stress and sadness, mood is better than previous.  Admits to poor sleep.  Normal bed time is 11pm and wakes at 8 but during that time sleeps very poorly.   Review of Systems For ROS see HPI     Objective:   Physical Exam  Constitutional: She is oriented to person, place, and time. She appears well-developed and well-nourished. No distress.  HENT:  Head: Normocephalic and atraumatic.  Eyes: Conjunctivae and EOM are normal. Pupils are equal, round, and reactive to light.  Cardiovascular: Normal rate, regular rhythm and normal heart sounds.   Pulmonary/Chest: Effort normal and breath sounds normal. No respiratory distress. She has no wheezes. She has no rales.  Neurological: She is alert and oriented to person, place, and time.  Skin: Skin is warm and dry.  Psychiatric: She has a normal mood and affect. Her behavior is normal. Thought content normal.  Vitals reviewed.         Assessment & Plan:

## 2014-10-23 NOTE — Progress Notes (Signed)
Pre visit review using our clinic review tool, if applicable. No additional management support is needed unless otherwise documented below in the visit note. 

## 2014-10-23 NOTE — Patient Instructions (Signed)
Schedule your complete physical for Feb Continue the Zoloft daily Start Melatonin nightly at about 10pm Call with any questions or concerns Sherri Rad in there!

## 2015-03-06 ENCOUNTER — Telehealth: Payer: Self-pay | Admitting: Behavioral Health

## 2015-03-06 ENCOUNTER — Encounter: Payer: Self-pay | Admitting: Behavioral Health

## 2015-03-06 NOTE — Telephone Encounter (Signed)
Pre-Visit Call completed with patient and chart updated.   Pre-Visit Info documented in Specialty Comments under [employer].    

## 2015-03-09 ENCOUNTER — Encounter: Payer: Self-pay | Admitting: Family Medicine

## 2015-03-09 ENCOUNTER — Ambulatory Visit (INDEPENDENT_AMBULATORY_CARE_PROVIDER_SITE_OTHER): Payer: Medicare Other | Admitting: Family Medicine

## 2015-03-09 VITALS — BP 134/84 | HR 71 | Temp 98.0°F | Ht 64.0 in | Wt 197.6 lb

## 2015-03-09 DIAGNOSIS — Z0001 Encounter for general adult medical examination with abnormal findings: Secondary | ICD-10-CM

## 2015-03-09 DIAGNOSIS — M810 Age-related osteoporosis without current pathological fracture: Secondary | ICD-10-CM | POA: Diagnosis not present

## 2015-03-09 DIAGNOSIS — Z0000 Encounter for general adult medical examination without abnormal findings: Secondary | ICD-10-CM

## 2015-03-09 DIAGNOSIS — M25561 Pain in right knee: Secondary | ICD-10-CM | POA: Diagnosis not present

## 2015-03-09 DIAGNOSIS — R030 Elevated blood-pressure reading, without diagnosis of hypertension: Secondary | ICD-10-CM | POA: Insufficient documentation

## 2015-03-09 DIAGNOSIS — IMO0001 Reserved for inherently not codable concepts without codable children: Secondary | ICD-10-CM

## 2015-03-09 DIAGNOSIS — E785 Hyperlipidemia, unspecified: Secondary | ICD-10-CM

## 2015-03-09 DIAGNOSIS — M25562 Pain in left knee: Secondary | ICD-10-CM

## 2015-03-09 NOTE — Assessment & Plan Note (Signed)
Pt's PE unchanged from previous- w/ known thyroid nodules on R.  UTD on mammo, DEXA, colonoscopy.  Written screening schedule updated and given to pt.  Check labs.  Anticipatory guidance provided.

## 2015-03-09 NOTE — Assessment & Plan Note (Signed)
Chronic problem for pt.  Refer back to Dr Althea Charon for complete evaluation and tx.

## 2015-03-09 NOTE — Patient Instructions (Signed)
Follow up in 6 months to recheck cholesterol We'll notify you of your lab results and make any changes if needed We'll call you with your Ortho appt for the knees Keep up the good work on healthy diet- you look great!!! You are up to date on mammo and bone density until August You are up to date on colonoscopy and do not need another one Check with your insurance company to see if the shingles vaccine is covered here in the office or at the pharmacy Call with any questions or concerns If you want to join [place] at the new [place] office, any scheduled appointments will automatically transfer and we will see you at [address] [place] [address] Tonya Johns [place], [place] [address] [facility]) Happy Valentine's Day!!!

## 2015-03-09 NOTE — Progress Notes (Signed)
Pre visit review using our clinic review tool, if applicable. No additional management support is needed unless otherwise documented below in the visit note. 

## 2015-03-09 NOTE — Assessment & Plan Note (Signed)
New.  Pt's BP was mildly elevated today but when taken w/ the appropriate cuff size, BP was WNL.  Asymptomatic.  Will continue to follow at future visits.

## 2015-03-09 NOTE — Assessment & Plan Note (Signed)
UTD on DEXA.  Check Vit D level.  Replete prn. 

## 2015-03-09 NOTE — Progress Notes (Signed)
   Subjective:    Patient ID: Tonya Johns, female    DOB: [birth year], 82 y.o.   MRN: [id number]  HPI Here today for CPE.  Risk Factors: Hyperlipidemia- chronic problem, on pravastatin Elevated BP- pt has never had elevated BP in the past.  Reports she was rushing for appt. Knee pain- L>R, has previously seen Dr Althea Charon at [facility] Physical Activity: not able to exercise due to severe knee pain- pt was told she requires a replacement Fall Risk: low Depression: denies Hearing: normal to conversational tones and whispered voice at 6 ft ADL's: independent Cognitive: normal linear thought process, memory and attention intact Home Safety: safe at home Height, Weight, BMI, Visual Acuity: see vitals, vision corrected to 20/20 w/ glasses Counseling: UTD on mammo (due in Aug), DEXA (due in Aug), Colonoscopy- no longer required Care team reviewed and updated w/ pt Labs Ordered: See A&P Care Plan: See A&P    Review of Systems Patient reports no vision/ hearing changes, adenopathy,fever, weight change,  persistant/recurrent hoarseness , swallowing issues, chest pain, palpitations, edema, persistant/recurrent cough, hemoptysis, dyspnea (rest/exertional/paroxysmal nocturnal), gastrointestinal bleeding (melena, rectal bleeding), abdominal pain, significant heartburn, bowel changes, GU symptoms (dysuria, hematuria, incontinence), Gyn symptoms (abnormal  bleeding, pain),  syncope, focal weakness, memory loss, numbness & tingling, skin/hair/nail changes, abnormal bruising or bleeding, anxiety, or depression.     Objective:   Physical Exam General Appearance:    Alert, cooperative, no distress, appears stated age  Head:    Normocephalic, without obvious abnormality, atraumatic  Eyes:    PERRL, conjunctiva/corneas clear, EOM's intact, fundi    benign, both eyes  Ears:    Normal TM's and external ear canals, both ears  Nose:   Nares normal, septum midline, mucosa normal, no drainage    or sinus  tenderness  Throat:   Lips, mucosa, and tongue normal; teeth and gums normal  Neck:   Supple, symmetrical, trachea midline, no adenopathy;    Thyroid: + goiter on R  Back:     Symmetric, no curvature, ROM normal, no CVA tenderness  Lungs:     Clear to auscultation bilaterally, respirations unlabored  Chest Wall:    No tenderness or deformity   Heart:    Regular rate and rhythm, S1 and S2 normal, no murmur, rub   or gallop  Breast Exam:    Deferred to mammo  Abdomen:     Soft, non-tender, bowel sounds active all four quadrants,    no masses, no organomegaly  Genitalia:    Deferred   Rectal:    Extremities:   Extremities normal, atraumatic, no cyanosis or edema  Pulses:   2+ and symmetric all extremities  Skin:   Skin color, texture, turgor normal, no rashes or lesions  Lymph nodes:   Cervical, supraclavicular, and axillary nodes normal  Neurologic:   CNII-XII intact, normal strength, sensation and reflexes    throughout          Assessment & Plan:

## 2015-03-09 NOTE — Assessment & Plan Note (Signed)
Chronic problem.  Tolerating pravastatin w/o difficulty.  Check labs.  Adjust meds prn  

## 2015-03-10 DIAGNOSIS — M1712 Unilateral primary osteoarthritis, left knee: Secondary | ICD-10-CM | POA: Diagnosis not present

## 2015-03-10 DIAGNOSIS — M25562 Pain in left knee: Secondary | ICD-10-CM | POA: Diagnosis not present

## 2015-03-10 DIAGNOSIS — M25561 Pain in right knee: Secondary | ICD-10-CM | POA: Diagnosis not present

## 2015-03-10 DIAGNOSIS — M1711 Unilateral primary osteoarthritis, right knee: Secondary | ICD-10-CM | POA: Diagnosis not present

## 2015-03-10 LAB — LIPID PANEL
CHOLESTEROL: 202 mg/dL — AB (ref 0–200)
HDL: 54.6 mg/dL (ref >39.00)
LDL CALC: 116 mg/dL — AB (ref 0–99)
NonHDL: 147.22
TRIGLYCERIDES: 154 mg/dL — AB (ref 0.0–149.0)
Total CHOL/HDL Ratio: 4
VLDL: 30.8 mg/dL (ref 0.0–40.0)

## 2015-03-10 LAB — CBC WITH DIFFERENTIAL/PLATELET
BASOS ABS: 0 10*3/uL (ref 0.0–0.1)
Basophils Relative: 0.2 % (ref 0.0–3.0)
Eosinophils Absolute: 0.1 10*3/uL (ref 0.0–0.7)
Eosinophils Relative: 1.3 % (ref 0.0–5.0)
HCT: 36.7 % (ref 36.0–46.0)
Hemoglobin: 12 g/dL (ref 12.0–15.0)
LYMPHS ABS: 2.7 10*3/uL (ref 0.7–4.0)
Lymphocytes Relative: 30 % (ref 12.0–46.0)
MCHC: 32.6 g/dL (ref 30.0–36.0)
MCV: 92.6 fl (ref 78.0–100.0)
MONO ABS: 0.4 10*3/uL (ref 0.1–1.0)
Monocytes Relative: 4.4 % (ref 3.0–12.0)
NEUTROS PCT: 64.1 % (ref 43.0–77.0)
Neutro Abs: 5.9 10*3/uL (ref 1.4–7.7)
Platelets: 224 10*3/uL (ref 150.0–400.0)
RBC: 3.96 Mil/uL (ref 3.87–5.11)
RDW: 14.2 % (ref 11.5–15.5)
WBC: 9.1 10*3/uL (ref 4.0–10.5)

## 2015-03-10 LAB — HEPATIC FUNCTION PANEL
ALBUMIN: 4 g/dL (ref 3.5–5.2)
ALK PHOS: 82 U/L (ref 39–117)
ALT: 15 U/L (ref 0–35)
AST: 19 U/L (ref 0–37)
Bilirubin, Direct: 0.1 mg/dL (ref 0.0–0.3)
TOTAL PROTEIN: 7 g/dL (ref 6.0–8.3)
Total Bilirubin: 0.3 mg/dL (ref 0.2–1.2)

## 2015-03-10 LAB — BASIC METABOLIC PANEL
BUN: 21 mg/dL (ref 6–23)
CO2: 28 mEq/L (ref 19–32)
CREATININE: 1.16 mg/dL (ref 0.40–1.20)
Calcium: 9.7 mg/dL (ref 8.4–10.5)
Chloride: 102 mEq/L (ref 96–112)
GFR: 57.4 mL/min — AB (ref >60.00)
GLUCOSE: 79 mg/dL (ref 70–99)
POTASSIUM: 4.3 meq/L (ref 3.5–5.1)
Sodium: 136 mEq/L (ref 135–145)

## 2015-03-10 LAB — VITAMIN D 25 HYDROXY (VIT D DEFICIENCY, FRACTURES): VITD: 39.83 ng/mL (ref 30.00–100.00)

## 2015-03-10 LAB — TSH: TSH: 0.44 u[IU]/mL (ref 0.35–4.50)

## 2015-03-11 DIAGNOSIS — Z961 Presence of intraocular lens: Secondary | ICD-10-CM | POA: Diagnosis not present

## 2015-03-11 DIAGNOSIS — H43811 Vitreous degeneration, right eye: Secondary | ICD-10-CM | POA: Diagnosis not present

## 2015-03-11 DIAGNOSIS — H43812 Vitreous degeneration, left eye: Secondary | ICD-10-CM | POA: Diagnosis not present

## 2015-03-11 DIAGNOSIS — Z9849 Cataract extraction status, unspecified eye: Secondary | ICD-10-CM | POA: Diagnosis not present

## 2015-03-12 ENCOUNTER — Encounter: Payer: Self-pay | Admitting: General Practice

## 2015-03-16 DIAGNOSIS — M25562 Pain in left knee: Secondary | ICD-10-CM | POA: Diagnosis not present

## 2015-03-16 DIAGNOSIS — M25561 Pain in right knee: Secondary | ICD-10-CM | POA: Diagnosis not present

## 2015-03-18 DIAGNOSIS — M25562 Pain in left knee: Secondary | ICD-10-CM | POA: Diagnosis not present

## 2015-03-18 DIAGNOSIS — M25561 Pain in right knee: Secondary | ICD-10-CM | POA: Diagnosis not present

## 2015-03-23 DIAGNOSIS — M25561 Pain in right knee: Secondary | ICD-10-CM | POA: Diagnosis not present

## 2015-03-23 DIAGNOSIS — M25562 Pain in left knee: Secondary | ICD-10-CM | POA: Diagnosis not present

## 2015-03-25 DIAGNOSIS — M25562 Pain in left knee: Secondary | ICD-10-CM | POA: Diagnosis not present

## 2015-03-25 DIAGNOSIS — M25561 Pain in right knee: Secondary | ICD-10-CM | POA: Diagnosis not present

## 2015-03-25 DIAGNOSIS — M1712 Unilateral primary osteoarthritis, left knee: Secondary | ICD-10-CM | POA: Diagnosis not present

## 2015-03-25 DIAGNOSIS — M1711 Unilateral primary osteoarthritis, right knee: Secondary | ICD-10-CM | POA: Diagnosis not present

## 2015-03-30 DIAGNOSIS — M25562 Pain in left knee: Secondary | ICD-10-CM | POA: Diagnosis not present

## 2015-03-30 DIAGNOSIS — M25561 Pain in right knee: Secondary | ICD-10-CM | POA: Diagnosis not present

## 2015-04-01 DIAGNOSIS — M7052 Other bursitis of knee, left knee: Secondary | ICD-10-CM | POA: Diagnosis not present

## 2015-04-01 DIAGNOSIS — M25562 Pain in left knee: Secondary | ICD-10-CM | POA: Diagnosis not present

## 2015-04-01 DIAGNOSIS — M7051 Other bursitis of knee, right knee: Secondary | ICD-10-CM | POA: Diagnosis not present

## 2015-04-01 DIAGNOSIS — M25561 Pain in right knee: Secondary | ICD-10-CM | POA: Diagnosis not present

## 2015-04-01 DIAGNOSIS — M1711 Unilateral primary osteoarthritis, right knee: Secondary | ICD-10-CM | POA: Diagnosis not present

## 2015-04-01 DIAGNOSIS — M1712 Unilateral primary osteoarthritis, left knee: Secondary | ICD-10-CM | POA: Diagnosis not present

## 2015-04-06 DIAGNOSIS — M25562 Pain in left knee: Secondary | ICD-10-CM | POA: Diagnosis not present

## 2015-04-06 DIAGNOSIS — M25561 Pain in right knee: Secondary | ICD-10-CM | POA: Diagnosis not present

## 2015-04-08 DIAGNOSIS — M1712 Unilateral primary osteoarthritis, left knee: Secondary | ICD-10-CM | POA: Diagnosis not present

## 2015-04-08 DIAGNOSIS — M25562 Pain in left knee: Secondary | ICD-10-CM | POA: Diagnosis not present

## 2015-04-08 DIAGNOSIS — M1711 Unilateral primary osteoarthritis, right knee: Secondary | ICD-10-CM | POA: Diagnosis not present

## 2015-04-08 DIAGNOSIS — M25561 Pain in right knee: Secondary | ICD-10-CM | POA: Diagnosis not present

## 2015-04-13 DIAGNOSIS — M25561 Pain in right knee: Secondary | ICD-10-CM | POA: Diagnosis not present

## 2015-04-13 DIAGNOSIS — M25562 Pain in left knee: Secondary | ICD-10-CM | POA: Diagnosis not present

## 2015-04-15 DIAGNOSIS — M1711 Unilateral primary osteoarthritis, right knee: Secondary | ICD-10-CM | POA: Diagnosis not present

## 2015-04-15 DIAGNOSIS — M1712 Unilateral primary osteoarthritis, left knee: Secondary | ICD-10-CM | POA: Diagnosis not present

## 2015-04-22 DIAGNOSIS — M1711 Unilateral primary osteoarthritis, right knee: Secondary | ICD-10-CM | POA: Diagnosis not present

## 2015-04-22 DIAGNOSIS — M1712 Unilateral primary osteoarthritis, left knee: Secondary | ICD-10-CM | POA: Diagnosis not present

## 2015-05-22 ENCOUNTER — Telehealth: Payer: Self-pay | Admitting: Family Medicine

## 2015-05-22 DIAGNOSIS — L600 Ingrowing nail: Secondary | ICD-10-CM

## 2015-05-22 NOTE — Telephone Encounter (Signed)
Referral placed and detailed message left to inform pt.

## 2015-05-22 NOTE — Telephone Encounter (Signed)
Ok for referral to podiatry for ingrown toenail

## 2015-05-22 NOTE — Telephone Encounter (Signed)
Please advise pt last seen 03/09/15 for her Medicare wellness exam

## 2015-05-22 NOTE — Telephone Encounter (Signed)
Pt states that she has an ingrown toenail and asking if Dr Beverely Lowabori needs to see her or does she need a referral?

## 2015-05-26 DIAGNOSIS — L03032 Cellulitis of left toe: Secondary | ICD-10-CM | POA: Diagnosis not present

## 2015-05-26 DIAGNOSIS — L02612 Cutaneous abscess of left foot: Secondary | ICD-10-CM | POA: Diagnosis not present

## 2015-05-26 DIAGNOSIS — M79675 Pain in left toe(s): Secondary | ICD-10-CM | POA: Diagnosis not present

## 2015-06-30 ENCOUNTER — Encounter: Payer: Self-pay | Admitting: Family Medicine

## 2015-06-30 ENCOUNTER — Ambulatory Visit (INDEPENDENT_AMBULATORY_CARE_PROVIDER_SITE_OTHER): Payer: Medicare Other | Admitting: Family Medicine

## 2015-06-30 VITALS — BP 130/82 | HR 64 | Temp 98.2°F | Resp 16 | Ht 64.0 in | Wt 192.1 lb

## 2015-06-30 DIAGNOSIS — L247 Irritant contact dermatitis due to plants, except food: Secondary | ICD-10-CM | POA: Diagnosis not present

## 2015-06-30 MED ORDER — METHYLPREDNISOLONE ACETATE 80 MG/ML IJ SUSP
80.0000 mg | Freq: Once | INTRAMUSCULAR | Status: AC
  Administered 2015-06-30: 80 mg via INTRAMUSCULAR

## 2015-06-30 MED ORDER — TRIAMCINOLONE ACETONIDE 0.1 % EX OINT: 1.0000 "application " | TOPICAL_OINTMENT | Freq: Two times a day (BID) | CUTANEOUS | Status: DC

## 2015-06-30 MED ORDER — PREDNISONE 10 MG PO TABS: ORAL_TABLET | ORAL | Status: DC

## 2015-06-30 NOTE — Patient Instructions (Signed)
Follow up as needed Start the Prednisone tomorrow morning- take all 3 pills for tomorrow at the same time and take w/ food Apply the Triamcinolone ointment to the itchy areas twice daily Call with any questions or concerns Hang in there!

## 2015-06-30 NOTE — Progress Notes (Signed)
Pre visit review using our clinic review tool, if applicable. No additional management support is needed unless otherwise documented below in the visit note. 

## 2015-06-30 NOTE — Assessment & Plan Note (Signed)
Pt's sxs and PE consistent w/ plant dermatitis.  Depomedrol injxn given today and pt to start prednisone taper tomorrow.  Triamcinolone ointment prn.  Reviewed supportive care and red flags that should prompt return.  Pt expressed understanding and is in agreement w/ plan.

## 2015-06-30 NOTE — Addendum Note (Signed)
Addended by: Geannie RisenBRODMERKEL, Lawerance Matsuo L on: 06/30/2015 10:21 AM   Modules accepted: Orders

## 2015-06-30 NOTE — Progress Notes (Signed)
   Subjective:    Patient ID: Cheryln ManlyElma Noe, female    DOB: July 07, 1930, 82 y.o.   MRN: [id number]  HPI Rash- pt reports she first developed rash on Saturday after working in the yard.  Rash is on both arms and there is a small spot above L knee where she rested her elbow.  Also has area on chest where she scratched.  Area is very itchy, red.  No fevers.  No drainage from areas.  No one at home w/ similar sxs.   Review of Systems For ROS see HPI     Objective:   Physical Exam  Constitutional: She is oriented to person, place, and time. She appears well-developed and well-nourished. No distress.  HENT:  Head: Normocephalic and atraumatic.  Cardiovascular: Intact distal pulses.   Neurological: She is alert and oriented to person, place, and time.  Skin: Skin is warm and dry. Rash (rash covering forearms bilaterally w/ linear areas on chest consistent w/ plant contact dermatitis) noted. There is erythema.  Psychiatric: She has a normal mood and affect. Her behavior is normal. Thought content normal.  Vitals reviewed.         Assessment & Plan:

## 2015-07-15 ENCOUNTER — Other Ambulatory Visit: Payer: Self-pay | Admitting: Family Medicine

## 2015-07-15 NOTE — Telephone Encounter (Signed)
Medication filled to pharmacy as requested.   

## 2015-08-18 ENCOUNTER — Other Ambulatory Visit: Payer: Self-pay | Admitting: Family Medicine

## 2015-08-18 DIAGNOSIS — Z1231 Encounter for screening mammogram for malignant neoplasm of breast: Secondary | ICD-10-CM

## 2015-09-10 ENCOUNTER — Ambulatory Visit (INDEPENDENT_AMBULATORY_CARE_PROVIDER_SITE_OTHER): Payer: Medicare Other | Admitting: Family Medicine

## 2015-09-10 ENCOUNTER — Encounter: Payer: Self-pay | Admitting: Family Medicine

## 2015-09-10 VITALS — BP 129/85 | HR 89 | Temp 98.1°F | Resp 16 | Ht 64.0 in | Wt 192.2 lb

## 2015-09-10 DIAGNOSIS — E785 Hyperlipidemia, unspecified: Secondary | ICD-10-CM | POA: Diagnosis not present

## 2015-09-10 DIAGNOSIS — E890 Postprocedural hypothyroidism: Secondary | ICD-10-CM | POA: Diagnosis not present

## 2015-09-10 NOTE — Patient Instructions (Signed)
Schedule your complete physical in 6 months We'll notify you of your lab results and make any changes if needed Keep up the good work on healthy diet and regular exercise- you look great!! Call with any questions or concerns Enjoy the rest of your summer!!! 

## 2015-09-10 NOTE — Assessment & Plan Note (Signed)
Chronic problem.  Pt is asking for labs today b/c she has an appt upcoming w/ Dr Everardo AllEllison next week.  Will get TSH today.

## 2015-09-10 NOTE — Progress Notes (Signed)
Pre visit review using our clinic review tool, if applicable. No additional management support is needed unless otherwise documented below in the visit note. 

## 2015-09-10 NOTE — Assessment & Plan Note (Signed)
Chronic problem.  Tolerating Pravastatin w/o difficulty.  Asymptomatic.  Check labs.  Adjust meds prn.

## 2015-09-10 NOTE — Progress Notes (Signed)
   Subjective:    Patient ID: Tonya Johns, female    DOB: 1930/11/28, 82 y.o.   MRN: [id number]  HPI Hyperlipidemia- chronic problem, on pravastatin daily.  Denies CP, SOB, HAs, visual changes, edema.  Hypothyroid- chronic problem, on Levothyroxine daily.  Denies fatigue, changes to skin/hair/nails.   Review of Systems For ROS see HPI     Objective:   Physical Exam  Constitutional: She is oriented to person, place, and time. She appears well-developed and well-nourished. No distress.  HENT:  Head: Normocephalic and atraumatic.  Eyes: Conjunctivae and EOM are normal. Pupils are equal, round, and reactive to light.  Neck: Normal range of motion. Neck supple. Thyromegaly present.  Cardiovascular: Normal rate, regular rhythm, normal heart sounds and intact distal pulses.   No murmur heard. Pulmonary/Chest: Effort normal and breath sounds normal. No respiratory distress.  Abdominal: Soft. She exhibits no distension. There is no tenderness.  Musculoskeletal: She exhibits no edema.  Lymphadenopathy:    She has no cervical adenopathy.  Neurological: She is alert and oriented to person, place, and time.  Skin: Skin is warm and dry.  Psychiatric: She has a normal mood and affect. Her behavior is normal.  Vitals reviewed.         Assessment & Plan:

## 2015-09-14 ENCOUNTER — Ambulatory Visit (INDEPENDENT_AMBULATORY_CARE_PROVIDER_SITE_OTHER): Payer: Medicare Other | Admitting: Endocrinology

## 2015-09-14 ENCOUNTER — Encounter: Payer: Self-pay | Admitting: Endocrinology

## 2015-09-14 VITALS — BP 134/76 | HR 63 | Temp 98.3°F | Ht 64.0 in | Wt 196.0 lb

## 2015-09-14 DIAGNOSIS — E785 Hyperlipidemia, unspecified: Secondary | ICD-10-CM

## 2015-09-14 DIAGNOSIS — E042 Nontoxic multinodular goiter: Secondary | ICD-10-CM | POA: Diagnosis not present

## 2015-09-14 LAB — TSH: TSH: 3.9 u[IU]/mL (ref 0.35–4.50)

## 2015-09-14 LAB — LIPID PANEL
CHOL/HDL RATIO: 4
CHOLESTEROL: 218 mg/dL — AB (ref 0–200)
HDL: 49.9 mg/dL (ref >39.00)
NonHDL: 168.11
TRIGLYCERIDES: 301 mg/dL — AB (ref 0.0–149.0)
VLDL: 60.2 mg/dL — AB (ref 0.0–40.0)

## 2015-09-14 LAB — LDL CHOLESTEROL, DIRECT: Direct LDL: 124 mg/dL

## 2015-09-14 NOTE — Patient Instructions (Addendum)
blood tests are requested for you today.  We'll let you know about the results.  Please come back for a follow-up appointment in 1 year.   most of the time, a "lumpy thyroid" will eventually become overactive again.  When this happens, we would first notice that you would need less thyroid medication.   

## 2015-09-14 NOTE — Progress Notes (Signed)
   Subjective:    Patient ID: Tonya Johns, female    DOB: [birth year], 82 y.o.   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  pt states she feels well in general. She does not notice the goiter.   Past Medical History:  Diagnosis Date  . Arthritis   . Diverticulitis   . GERD (gastroesophageal reflux disease)   . Hyperlipidemia   . Leg swelling   . Osteoporosis     No past surgical history on file.  Social History   Social History  . Marital status: Single    Spouse name: N/A  . Number of children: N/A  . Years of education: N/A   Occupational History  . Not on file.   Social History Main Topics  . Smoking status: Never Smoker  . Smokeless tobacco: Never Used  . Alcohol use No  . Drug use: No  . Sexual activity: Not on file   Other Topics Concern  . Not on file   Social History Narrative  . No narrative on file    Current Outpatient Prescriptions on File Prior to Visit  Medication Sig Dispense Refill  . Coenzyme Q10 (CO Q10) 100 MG CAPS Take 100 mg by mouth daily.      Marland Kitchen. levothyroxine (SYNTHROID, LEVOTHROID) 100 MCG tablet Take 1 tablet by mouth  daily 90 tablet 1  . meloxicam (MOBIC) 15 MG tablet TAKE 1 TABLET BY MOUTH DAILY WITH FOOD AS NEEDED  3  . Multiple Vitamins-Minerals (MULTIVITAMIN PO) Take 1 tablet by mouth daily.    . pravastatin (PRAVACHOL) 80 MG tablet Take 1 tablet by mouth  daily 90 tablet 1   No current facility-administered medications on file prior to visit.     Allergies  Allergen Reactions  . Penicillins Rash    Family History  Problem Relation Age of Onset  . Alcohol abuse Brother   . Alcohol abuse Sister   . Arthritis Mother   . Stroke Mother   . Hypertension Mother   . Arthritis Father   . Heart disease Father   . Hyperlipidemia Brother   . Hyperlipidemia Sister   . Sudden death Brother    . Mental illness Sister   . Diabetes Sister     BP 134/76   Pulse 63   Temp 98.3 F (36.8 C) (Oral)   Ht 5\' 4"  (1.626 m)   Wt 196 lb (88.9 kg)   SpO2 97%   BMI 33.64 kg/m    Review of Systems Denies neck pain    Objective:   Physical Exam VITAL SIGNS:  See vs page GENERAL: no distress Neck: multinodular goiter, (R>L), is again noted.     Lab Results  Component Value Date   TSH 3.90 09/14/2015      Assessment & Plan:  Hypothyroidism: well-replaced Multinodular goiter: clinically stable

## 2015-09-14 NOTE — Progress Notes (Signed)
Pre visit review using our clinic tool,if applicable. No additional management support is needed unless otherwise documented below in the visit note.  

## 2015-09-24 ENCOUNTER — Ambulatory Visit (HOSPITAL_BASED_OUTPATIENT_CLINIC_OR_DEPARTMENT_OTHER)
Admission: RE | Admit: 2015-09-24 | Discharge: 2015-09-24 | Disposition: A | Discharge status: Home / Self Care | Payer: Medicare Other | Source: Ambulatory Visit | Attending: Family Medicine | Admitting: Family Medicine

## 2015-09-24 DIAGNOSIS — Z1231 Encounter for screening mammogram for malignant neoplasm of breast: Secondary | ICD-10-CM | POA: Diagnosis not present

## 2015-11-26 ENCOUNTER — Ambulatory Visit (INDEPENDENT_AMBULATORY_CARE_PROVIDER_SITE_OTHER): Payer: Medicare Other

## 2015-11-26 DIAGNOSIS — Z23 Encounter for immunization: Secondary | ICD-10-CM | POA: Diagnosis not present

## 2016-01-21 ENCOUNTER — Other Ambulatory Visit: Payer: Self-pay | Admitting: Family Medicine

## 2016-03-10 ENCOUNTER — Ambulatory Visit (INDEPENDENT_AMBULATORY_CARE_PROVIDER_SITE_OTHER): Payer: Medicare Other | Admitting: Family Medicine

## 2016-03-10 ENCOUNTER — Encounter: Payer: Self-pay | Admitting: Family Medicine

## 2016-03-10 VITALS — BP 123/86 | HR 82 | Temp 98.1°F | Resp 16 | Ht 64.0 in | Wt 190.5 lb

## 2016-03-10 DIAGNOSIS — Z23 Encounter for immunization: Secondary | ICD-10-CM

## 2016-03-10 DIAGNOSIS — E785 Hyperlipidemia, unspecified: Secondary | ICD-10-CM | POA: Diagnosis not present

## 2016-03-10 DIAGNOSIS — Z0000 Encounter for general adult medical examination without abnormal findings: Secondary | ICD-10-CM

## 2016-03-10 DIAGNOSIS — E890 Postprocedural hypothyroidism: Secondary | ICD-10-CM

## 2016-03-10 LAB — BASIC METABOLIC PANEL
BUN: 20 mg/dL (ref 6–23)
CALCIUM: 9.6 mg/dL (ref 8.4–10.5)
CO2: 30 meq/L (ref 19–32)
CREATININE: 1.19 mg/dL (ref 0.40–1.20)
Chloride: 104 mEq/L (ref 96–112)
GFR: 55.6 mL/min — AB (ref >60.00)
GLUCOSE: 86 mg/dL (ref 70–99)
Potassium: 4.9 mEq/L (ref 3.5–5.1)
SODIUM: 138 meq/L (ref 135–145)

## 2016-03-10 LAB — CBC WITH DIFFERENTIAL/PLATELET
BASOS ABS: 0.1 10*3/uL (ref 0.0–0.1)
Basophils Relative: 0.9 % (ref 0.0–3.0)
EOS ABS: 0.1 10*3/uL (ref 0.0–0.7)
Eosinophils Relative: 1.7 % (ref 0.0–5.0)
HCT: 35 % — ABNORMAL LOW (ref 36.0–46.0)
Hemoglobin: 11.7 g/dL — ABNORMAL LOW (ref 12.0–15.0)
Lymphocytes Relative: 33.4 % (ref 12.0–46.0)
Lymphs Abs: 2.5 10*3/uL (ref 0.7–4.0)
MCHC: 33.5 g/dL (ref 30.0–36.0)
MCV: 90.8 fl (ref 78.0–100.0)
MONOS PCT: 7.3 % (ref 3.0–12.0)
Monocytes Absolute: 0.5 10*3/uL (ref 0.1–1.0)
NEUTROS ABS: 4.2 10*3/uL (ref 1.4–7.7)
Neutrophils Relative %: 56.7 % (ref 43.0–77.0)
PLATELETS: 219 10*3/uL (ref 150.0–400.0)
RBC: 3.85 Mil/uL — AB (ref 3.87–5.11)
RDW: 13.4 % (ref 11.5–15.5)
WBC: 7.5 10*3/uL (ref 4.0–10.5)

## 2016-03-10 LAB — HEPATIC FUNCTION PANEL
ALBUMIN: 4.1 g/dL (ref 3.5–5.2)
ALT: 11 U/L (ref 0–35)
AST: 15 U/L (ref 0–37)
Alkaline Phosphatase: 78 U/L (ref 39–117)
Bilirubin, Direct: 0.1 mg/dL (ref 0.0–0.3)
TOTAL PROTEIN: 6.5 g/dL (ref 6.0–8.3)
Total Bilirubin: 0.5 mg/dL (ref 0.2–1.2)

## 2016-03-10 LAB — LIPID PANEL
Cholesterol: 214 mg/dL — ABNORMAL HIGH (ref 0–200)
HDL: 61.7 mg/dL (ref >39.00)
LDL CALC: 135 mg/dL — AB (ref 0–99)
NonHDL: 152.79
TRIGLYCERIDES: 89 mg/dL (ref 0.0–149.0)
Total CHOL/HDL Ratio: 3
VLDL: 17.8 mg/dL (ref 0.0–40.0)

## 2016-03-10 LAB — TSH: TSH: 2.56 u[IU]/mL (ref 0.35–4.50)

## 2016-03-10 NOTE — Assessment & Plan Note (Signed)
Chronic problem.  Tolerating statin w/o difficulty.  Stressed need for healthy diet and regular exercise.  Check labs.  Adjust meds prn  

## 2016-03-10 NOTE — Progress Notes (Signed)
   Subjective:    Patient ID: Cheryln ManlyElma Gasser, female    DOB: December 15, 1930, 83 y.o.   MRN: [id number]  HPI Here today for CPE.  Risk Factors: Hyperlipidemia- chronic problem, on Pravastatin daily.  Pt has lost 6 lbs since last visit Hypothyroid- chronic problem, on Levothyroxine 100mcg daily Physical Activity: no formal exercise due to weather, very active once weather warms Fall Risk: low risk Depression: denies Hearing: normal to conversational tones, mildly decreased to whispered voice ADL's: independent Cognitive: normal linear thought process, memory and attention intact Home Safety: safe at home Height, Weight, BMI, Visual Acuity: see vitals, vision corrected to 20/20 w/ glasses Counseling: UTD on colonoscopy (will not need another), UTD on mammo, due for pneumovax Care team reviewed and updated Labs Ordered: See A&P Care Plan: See A&P    Review of Systems Patient reports no vision/ hearing changes, adenopathy,fever, weight change,  persistant/recurrent hoarseness , swallowing issues, chest pain, palpitations, edema, persistant/recurrent cough, hemoptysis, dyspnea (rest/exertional/paroxysmal nocturnal), gastrointestinal bleeding (melena, rectal bleeding), abdominal pain, significant heartburn, bowel changes, GU symptoms (dysuria, hematuria, incontinence), Gyn symptoms (abnormal  bleeding, pain),  syncope, focal weakness, memory loss, numbness & tingling, skin/hair/nail changes, abnormal bruising or bleeding, anxiety, or depression.     Objective:   Physical Exam General Appearance:    Alert, cooperative, no distress, appears stated age  Head:    Normocephalic, without obvious abnormality, atraumatic  Eyes:    PERRL, conjunctiva/corneas clear, EOM's intact, fundi    benign, both eyes  Ears:    Normal TM's and external ear canals, both ears  Nose:   Nares normal, septum midline, mucosa normal, no drainage    or sinus tenderness  Throat:   Lips, mucosa, and tongue normal; teeth and  gums normal  Neck:   Supple, symmetrical, trachea midline, no adenopathy;    Thyroid: multinodular goiter  Back:     Symmetric, no curvature, ROM normal, no CVA tenderness  Lungs:     Clear to auscultation bilaterally, respirations unlabored  Chest Wall:    No tenderness or deformity   Heart:    Regular rate and rhythm, S1 and S2 normal, no murmur, rub   or gallop  Breast Exam:    Deferred to mammo  Abdomen:     Soft, non-tender, bowel sounds active all four quadrants,    no masses, no organomegaly  Genitalia:    Deferred  Rectal:    Extremities:   Extremities normal, atraumatic, no cyanosis or edema  Pulses:   2+ and symmetric all extremities  Skin:   Skin color, texture, turgor normal, no rashes or lesions  Lymph nodes:   Cervical, supraclavicular, and axillary nodes normal  Neurologic:   CNII-XII intact, normal strength, sensation and reflexes    throughout          Assessment & Plan:

## 2016-03-10 NOTE — Addendum Note (Signed)
Addended by: Geannie RisenBRODMERKEL, JESSICA L on: 03/10/2016 02:08 PM   Modules accepted: Orders

## 2016-03-10 NOTE — Assessment & Plan Note (Signed)
Pt's PE WNL w/ exception of obesity.  UTD on mammo, colonoscopy.  Due for pneumovax.  Written screening schedule updated and given to pt.  Check labs.  Anticipatory guidance provided.

## 2016-03-10 NOTE — Patient Instructions (Signed)
Follow up in 6 months to recheck cholesterol We'll notify you of your lab results and make any changes if needed Continue to work on healthy diet and regular exercise- you look great! You are up to date on colonoscopy and mammogram- yay! Call with any questions or concerns Happy Valentine's Day!!!

## 2016-03-10 NOTE — Assessment & Plan Note (Signed)
Chronic problem.  Asymptomatic.  Check labs.  Forward to Dr Everardo AllEllison.

## 2016-03-10 NOTE — Progress Notes (Signed)
Pre visit review using our clinic review tool, if applicable. No additional management support is needed unless otherwise documented below in the visit note. 

## 2016-03-11 ENCOUNTER — Encounter: Payer: Self-pay | Admitting: General Practice

## 2016-05-03 ENCOUNTER — Other Ambulatory Visit: Payer: Self-pay | Admitting: Family Medicine

## 2016-06-30 DIAGNOSIS — H1089 Other conjunctivitis: Secondary | ICD-10-CM | POA: Diagnosis not present

## 2016-09-13 ENCOUNTER — Ambulatory Visit: Payer: Medicare Other | Admitting: Endocrinology

## 2016-09-20 ENCOUNTER — Encounter: Payer: Self-pay | Admitting: Family Medicine

## 2016-09-20 ENCOUNTER — Ambulatory Visit (INDEPENDENT_AMBULATORY_CARE_PROVIDER_SITE_OTHER): Payer: Medicare Other | Admitting: Family Medicine

## 2016-09-20 VITALS — BP 130/88 | HR 82 | Temp 98.1°F | Resp 17 | Ht 64.0 in | Wt 182.4 lb

## 2016-09-20 DIAGNOSIS — Z23 Encounter for immunization: Secondary | ICD-10-CM | POA: Diagnosis not present

## 2016-09-20 DIAGNOSIS — E890 Postprocedural hypothyroidism: Secondary | ICD-10-CM

## 2016-09-20 DIAGNOSIS — E785 Hyperlipidemia, unspecified: Secondary | ICD-10-CM

## 2016-09-20 LAB — HEPATIC FUNCTION PANEL
ALBUMIN: 3.9 g/dL (ref 3.5–5.2)
ALK PHOS: 120 U/L — AB (ref 39–117)
ALT: 38 U/L — ABNORMAL HIGH (ref 0–35)
AST: 28 U/L (ref 0–37)
BILIRUBIN DIRECT: 0.1 mg/dL (ref 0.0–0.3)
Total Bilirubin: 0.5 mg/dL (ref 0.2–1.2)
Total Protein: 6.6 g/dL (ref 6.0–8.3)

## 2016-09-20 LAB — CBC WITH DIFFERENTIAL/PLATELET
Basophils Absolute: 0.1 10*3/uL (ref 0.0–0.1)
Basophils Relative: 1 % (ref 0.0–3.0)
EOS PCT: 1.2 % (ref 0.0–5.0)
Eosinophils Absolute: 0.1 10*3/uL (ref 0.0–0.7)
HEMATOCRIT: 36.1 % (ref 36.0–46.0)
Hemoglobin: 11.9 g/dL — ABNORMAL LOW (ref 12.0–15.0)
LYMPHS ABS: 2 10*3/uL (ref 0.7–4.0)
Lymphocytes Relative: 28.1 % (ref 12.0–46.0)
MCHC: 33 g/dL (ref 30.0–36.0)
MCV: 91.8 fl (ref 78.0–100.0)
MONOS PCT: 7 % (ref 3.0–12.0)
Monocytes Absolute: 0.5 10*3/uL (ref 0.1–1.0)
NEUTROS PCT: 62.7 % (ref 43.0–77.0)
Neutro Abs: 4.5 10*3/uL (ref 1.4–7.7)
Platelets: 213 10*3/uL (ref 150.0–400.0)
RBC: 3.94 Mil/uL (ref 3.87–5.11)
RDW: 13.4 % (ref 11.5–15.5)
WBC: 7.2 10*3/uL (ref 4.0–10.5)

## 2016-09-20 LAB — LIPID PANEL
CHOL/HDL RATIO: 3
Cholesterol: 183 mg/dL (ref 0–200)
HDL: 58 mg/dL (ref >39.00)
LDL CALC: 104 mg/dL — AB (ref 0–99)
NonHDL: 125.07
Triglycerides: 107 mg/dL (ref 0.0–149.0)
VLDL: 21.4 mg/dL (ref 0.0–40.0)

## 2016-09-20 LAB — BASIC METABOLIC PANEL
BUN: 26 mg/dL — AB (ref 6–23)
CO2: 30 mEq/L (ref 19–32)
Calcium: 9.5 mg/dL (ref 8.4–10.5)
Chloride: 104 mEq/L (ref 96–112)
Creatinine, Ser: 1.33 mg/dL — ABNORMAL HIGH (ref 0.40–1.20)
GFR: 48.84 mL/min — AB (ref >60.00)
Glucose, Bld: 101 mg/dL — ABNORMAL HIGH (ref 70–99)
POTASSIUM: 4.7 meq/L (ref 3.5–5.1)
SODIUM: 139 meq/L (ref 135–145)

## 2016-09-20 LAB — TSH: TSH: 1.06 u[IU]/mL (ref 0.35–4.50)

## 2016-09-20 NOTE — Assessment & Plan Note (Signed)
Chronic problem.  Following w/ Dr Everardo All.  Pt is asking for labs to be drawn today to avoid a 2nd stick.  Labs ordered.

## 2016-09-20 NOTE — Progress Notes (Signed)
Pre visit review using our clinic review tool, if applicable. No additional management support is needed unless otherwise documented below in the visit note. 

## 2016-09-20 NOTE — Assessment & Plan Note (Signed)
Chronic problem.  On Pravastatin daily.  Applauded her recent weight loss.  Check labs.  Adjust meds prn

## 2016-09-20 NOTE — Progress Notes (Signed)
   Subjective:    Patient ID: Tonya Johns, female    DOB: [birth year], 83 y.o.   MRN: [id number]  HPI Hyperlipidemia- chronic problem, on Pravastatin daily.  Pt has lost 9 lbs since last visit.  Pt reports she is just eating less than previously- no longer eating red meat.  Eating fish.  No CP, SOB, HAs, visual changes, abd pain, N/V.  Hypothyroid- chronic problem, on Levothyroxine daily.  Intermittent fatigue, no changes to skin/hair/nails but skin remains dry.  Health Maintenance- pt wants to do mammos every other year, will do Aug 2019.   Review of Systems For ROS see HPI     Objective:   Physical Exam  Constitutional: She is oriented to person, place, and time. She appears well-developed and well-nourished. No distress.  HENT:  Head: Normocephalic and atraumatic.  Eyes: Pupils are equal, round, and reactive to light. Conjunctivae and EOM are normal.  Neck: Normal range of motion. Neck supple. No thyromegaly present.  Cardiovascular: Normal rate, regular rhythm, normal heart sounds and intact distal pulses.   No murmur heard. Pulmonary/Chest: Effort normal and breath sounds normal. No respiratory distress.  Abdominal: Soft. She exhibits no distension. There is no tenderness.  Musculoskeletal: She exhibits no edema.  Lymphadenopathy:    She has no cervical adenopathy.  Neurological: She is alert and oriented to person, place, and time.  Skin: Skin is warm and dry.  Psychiatric: She has a normal mood and affect. Her behavior is normal.  Vitals reviewed.         Assessment & Plan:

## 2016-09-20 NOTE — Patient Instructions (Signed)
Schedule your complete physical and Medicare Wellness Visit in 6 months We'll notify you of your lab results and make any changes if needed Continue to work on healthy diet and regular exercise- you look great! We will do your mammogram next year! Call with any questions or concerns Enjoy the rest of your summer!!!

## 2016-09-21 ENCOUNTER — Other Ambulatory Visit: Payer: Self-pay | Admitting: Family Medicine

## 2016-09-21 ENCOUNTER — Other Ambulatory Visit (INDEPENDENT_AMBULATORY_CARE_PROVIDER_SITE_OTHER): Payer: Medicare Other

## 2016-09-21 DIAGNOSIS — R748 Abnormal levels of other serum enzymes: Secondary | ICD-10-CM

## 2016-09-21 DIAGNOSIS — R7989 Other specified abnormal findings of blood chemistry: Secondary | ICD-10-CM

## 2016-09-21 LAB — GAMMA GT: GGT: 103 U/L — AB (ref 7–51)

## 2016-10-05 ENCOUNTER — Other Ambulatory Visit: Payer: Self-pay | Admitting: Family Medicine

## 2016-10-05 ENCOUNTER — Other Ambulatory Visit (INDEPENDENT_AMBULATORY_CARE_PROVIDER_SITE_OTHER): Payer: Medicare Other

## 2016-10-05 ENCOUNTER — Ambulatory Visit (HOSPITAL_BASED_OUTPATIENT_CLINIC_OR_DEPARTMENT_OTHER)
Admission: RE | Admit: 2016-10-05 | Discharge: 2016-10-05 | Disposition: A | Discharge status: Home / Self Care | Payer: Medicare Other | Source: Ambulatory Visit | Attending: Family Medicine | Admitting: Family Medicine

## 2016-10-05 DIAGNOSIS — R748 Abnormal levels of other serum enzymes: Secondary | ICD-10-CM | POA: Diagnosis not present

## 2016-10-05 DIAGNOSIS — R109 Unspecified abdominal pain: Secondary | ICD-10-CM | POA: Diagnosis not present

## 2016-10-05 DIAGNOSIS — R7989 Other specified abnormal findings of blood chemistry: Secondary | ICD-10-CM | POA: Diagnosis not present

## 2016-10-05 LAB — BASIC METABOLIC PANEL
BUN: 20 mg/dL (ref 6–23)
CALCIUM: 9.9 mg/dL (ref 8.4–10.5)
CO2: 24 mEq/L (ref 19–32)
Chloride: 105 mEq/L (ref 96–112)
Creatinine, Ser: 1.42 mg/dL — ABNORMAL HIGH (ref 0.40–1.20)
GFR: 45.28 mL/min — AB (ref >60.00)
GLUCOSE: 120 mg/dL — AB (ref 70–99)
Potassium: 3.9 mEq/L (ref 3.5–5.1)
SODIUM: 140 meq/L (ref 135–145)

## 2016-10-05 LAB — HEPATIC FUNCTION PANEL
ALBUMIN: 4.2 g/dL (ref 3.5–5.2)
ALT: 41 U/L — ABNORMAL HIGH (ref 0–35)
AST: 30 U/L (ref 0–37)
Alkaline Phosphatase: 144 U/L — ABNORMAL HIGH (ref 39–117)
BILIRUBIN DIRECT: 0.1 mg/dL (ref 0.0–0.3)
TOTAL PROTEIN: 6.9 g/dL (ref 6.0–8.3)
Total Bilirubin: 0.5 mg/dL (ref 0.2–1.2)

## 2016-10-10 ENCOUNTER — Encounter: Payer: Self-pay | Admitting: Gastroenterology

## 2016-10-13 ENCOUNTER — Ambulatory Visit (INDEPENDENT_AMBULATORY_CARE_PROVIDER_SITE_OTHER): Payer: Medicare Other | Admitting: Endocrinology

## 2016-10-13 ENCOUNTER — Encounter: Payer: Self-pay | Admitting: Endocrinology

## 2016-10-13 VITALS — BP 150/72 | HR 67 | Wt 182.6 lb

## 2016-10-13 DIAGNOSIS — E890 Postprocedural hypothyroidism: Secondary | ICD-10-CM | POA: Diagnosis not present

## 2016-10-13 NOTE — Progress Notes (Signed)
   Subjective:    Patient ID: Tonya Johns, female    DOB: December 10, 1930, 83 y.o.   MRN: [id number]  HPI Pt returns for f/u of post-RAI hypothyroidism (she had RAI, for hyperthyroidism due to multinodular goiter, in late 2012; US in 2016 was only minimally changed).  pt states she feels well in general. She does not notice the goiter.    Past Medical History:  Diagnosis Date  . Arthritis   . Diverticulitis   . GERD (gastroesophageal reflux disease)   . Hyperlipidemia   . Leg swelling   . Osteoporosis     No past surgical history on file.  Social History   Social History  . Marital status: Single    Spouse name: N/A  . Number of children: N/A  . Years of education: N/A   Occupational History  . Not on file.   Social History Main Topics  . Smoking status: Never Smoker  . Smokeless tobacco: Never Used  . Alcohol use No  . Drug use: No  . Sexual activity: Not on file   Other Topics Concern  . Not on file   Social History Narrative  . No narrative on file    Current Outpatient Prescriptions on File Prior to Visit  Medication Sig Dispense Refill  . Coenzyme Q10 (CO Q10) 100 MG CAPS Take 100 mg by mouth daily.      Marland Kitchen. levothyroxine (SYNTHROID, LEVOTHROID) 100 MCG tablet TAKE 1 TABLET BY MOUTH  DAILY 90 tablet 1  . meloxicam (MOBIC) 15 MG tablet TAKE 1 TABLET BY MOUTH DAILY WITH FOOD AS NEEDED  3  . Multiple Vitamins-Minerals (MULTIVITAMIN PO) Take 1 tablet by mouth daily.    . pravastatin (PRAVACHOL) 80 MG tablet TAKE 1 TABLET BY MOUTH  DAILY 90 tablet 1   No current facility-administered medications on file prior to visit.     Allergies  Allergen Reactions  . Penicillins Rash    Family History  Problem Relation Age of Onset  . Alcohol abuse Brother   . Alcohol abuse Sister   . Arthritis Mother   . Stroke Mother   . Hypertension Mother   . Arthritis Father   . Heart disease Father   . Hyperlipidemia Brother   . Hyperlipidemia Sister   . Sudden death Brother     . Mental illness Sister   . Diabetes Sister     BP (!) 150/72   Pulse 67   Wt 182 lb 9.6 oz (82.8 kg)   SpO2 97%   BMI 31.34 kg/m    Review of Systems Denies dysphagia and sob    Objective:   Physical Exam VITAL SIGNS:  See vs page GENERAL: no distress Neck: thyroid is approx 5 times normal size, with multinodular surface, (R>L).   Lab Results  Component Value Date   TSH 1.06 09/20/2016      Assessment & Plan:  Post-RAI hypothyroidism: well-replaced.  Please continue the same medication. Multinodular goiter: clinically stable.  given advanced age, she needs f/u US only PRN  Patient Instructions  Please continue the same medication.    Please come back for a follow-up appointment in 1 year.   most of the time, a "lumpy thyroid" will eventually become overactive again.  When this happens, we would first notice that you would need less thyroid medication.

## 2016-10-13 NOTE — Patient Instructions (Addendum)
Please continue the same medication.    Please come back for a follow-up appointment in 1 year.   most of the time, a "lumpy thyroid" will eventually become overactive again.  When this happens, we would first notice that you would need less thyroid medication.

## 2016-11-18 DIAGNOSIS — M25561 Pain in right knee: Secondary | ICD-10-CM | POA: Diagnosis not present

## 2016-11-18 DIAGNOSIS — M25562 Pain in left knee: Secondary | ICD-10-CM | POA: Diagnosis not present

## 2016-11-18 DIAGNOSIS — M1712 Unilateral primary osteoarthritis, left knee: Secondary | ICD-10-CM | POA: Diagnosis not present

## 2016-11-18 DIAGNOSIS — M1711 Unilateral primary osteoarthritis, right knee: Secondary | ICD-10-CM | POA: Diagnosis not present

## 2016-12-02 ENCOUNTER — Other Ambulatory Visit (INDEPENDENT_AMBULATORY_CARE_PROVIDER_SITE_OTHER): Payer: Medicare Other

## 2016-12-02 ENCOUNTER — Ambulatory Visit (INDEPENDENT_AMBULATORY_CARE_PROVIDER_SITE_OTHER): Payer: Medicare Other | Admitting: Gastroenterology

## 2016-12-02 ENCOUNTER — Encounter: Payer: Self-pay | Admitting: Gastroenterology

## 2016-12-02 VITALS — BP 160/80 | HR 72 | Ht 62.0 in | Wt 184.5 lb

## 2016-12-02 DIAGNOSIS — R945 Abnormal results of liver function studies: Secondary | ICD-10-CM

## 2016-12-02 DIAGNOSIS — R748 Abnormal levels of other serum enzymes: Secondary | ICD-10-CM

## 2016-12-02 DIAGNOSIS — R7989 Other specified abnormal findings of blood chemistry: Secondary | ICD-10-CM

## 2016-12-02 LAB — HEPATIC FUNCTION PANEL
ALT: 9 U/L (ref 0–35)
AST: 13 U/L (ref 0–37)
Albumin: 3.9 g/dL (ref 3.5–5.2)
Alkaline Phosphatase: 70 U/L (ref 39–117)
Bilirubin, Direct: 0 mg/dL (ref 0.0–0.3)
Total Bilirubin: 0.3 mg/dL (ref 0.2–1.2)
Total Protein: 6.8 g/dL (ref 6.0–8.3)

## 2016-12-02 NOTE — Progress Notes (Signed)
HPI: This is a very pleasant 83 year old woman who was referred to me by Sheliah Hatch, MD  to evaluate elevated liver tests.    Chief complaint is elevated alkaline phosphatase  She has never had liver problems that she is aware of.  She has never been a big alcohol drinker.  Liver problems do not run in her family.  She has not had any significant  abdominal pains, nausea, vomiting, bowel changes.  No pruritus, no yellowing of her eyes.  She has not been on any new medicines in the past several years.  Old Data Reviewed: Labs (08/2016-10/2016) show a slightly elevated alkaline phosphatase at 120 and then 144.  GGT is elevated.  The rest of her liver tests are completely normal except an ALT of 38 and then 41. Abdominal ultrasound September 2018 was normal (no gallstones in her gallbladder and a normal biliary tree, common bile duct.)    Review of systems: Pertinent positive and negative review of systems were noted in the above HPI section. All other review negative.   Past Medical History:  Diagnosis Date  . Arthritis   . Diverticulitis   . GERD (gastroesophageal reflux disease)   . Hyperlipidemia   . Hypothyroidism   . Leg swelling   . Osteoporosis     Past Surgical History:  Procedure Laterality Date  . NO PAST SURGERIES      Current Outpatient Prescriptions  Medication Sig Dispense Refill  . Coenzyme Q10 (CO Q10) 100 MG CAPS Take 100 mg by mouth daily.      Marland Kitchen levothyroxine (SYNTHROID, LEVOTHROID) 100 MCG tablet TAKE 1 TABLET BY MOUTH  DAILY 90 tablet 1  . meloxicam (MOBIC) 15 MG tablet TAKE 1 TABLET BY MOUTH DAILY WITH FOOD AS NEEDED  3  . Multiple Vitamins-Minerals (MULTIVITAMIN PO) Take 1 tablet by mouth daily.    . pravastatin (PRAVACHOL) 80 MG tablet TAKE 1 TABLET BY MOUTH  DAILY 90 tablet 1   No current facility-administered medications for this visit.     Allergies as of 12/02/2016 - Review Complete 12/02/2016  Allergen Reaction Noted  . Penicillins  Rash 06/07/2010    Family History  Problem Relation Age of Onset  . Alcohol abuse Brother   . Alcohol abuse Sister   . Arthritis Mother   . Stroke Mother   . Hypertension Mother   . Arthritis Father   . Heart disease Father   . Hyperlipidemia Brother   . Hyperlipidemia Sister   . Sudden death Brother   . Mental illness Sister   . Diabetes Sister     Social History   Social History  . Marital status: Single    Spouse name: N/A  . Number of children: 3  . Years of education: N/A   Occupational History  . retired    Social History Main Topics  . Smoking status: Never Smoker  . Smokeless tobacco: Never Used  . Alcohol use No  . Drug use: No  . Sexual activity: Not on file   Other Topics Concern  . Not on file   Social History Narrative  . No narrative on file     Physical Exam: BP (!) 160/80 (BP Location: Left Arm, Patient Position: Sitting, Cuff Size: Normal)   Pulse 72   Ht 5\' 2"  (1.575 m) Comment: height measured without shoes  Wt 184 lb 8 oz (83.7 kg)   BMI 33.75 kg/m  Constitutional: generally well-appearing Psychiatric: alert and oriented x3 Eyes: extraocular movements intact  Mouth: oral pharynx moist, no lesions Neck: supple no lymphadenopathy Cardiovascular: heart regular rate and rhythm Lungs: clear to auscultation bilaterally Abdomen: soft, nontender, nondistended, no obvious ascites, no peritoneal signs, normal bowel sounds Extremities: no lower extremity edema bilaterally Skin: no lesions on visible extremities   Assessment and plan: 83 y.o. female with elevated liver tests  Her alkaline phosphatase and her ALT are both very slightly elevated.  She has no symptoms of chronic liver disease and no risk factors for chronic liver disease.  I recommended some further lab testing including ANA, AMA, anti-smooth muscle antibody, viral hepatitis testing.  If all of these are negative then I would simply observe her liver tests periodically probably  every 2 or 3 months to make sure they are not significantly rising.  If she does have significant elevation in any time without other clear explanation that she will probably require liver biopsy.  I doubt it will come to that however.    Please see the "Patient Instructions" section for addition details about the plan.   Rob Buntinganiel Lauralyn Shadowens, MD [facility] 12/02/2016, 3:16 PM  Cc: Sheliah Hatchabori, Katherine E, MD

## 2016-12-02 NOTE — Patient Instructions (Addendum)
You will have labs checked today in the basement lab.  Please head down after you check out with the front desk  (hepatic function panel, hepatitis A total antibody, hepatitis B surface antigen, hepatitis B surface antibody, hepatitis C antibody, AMA, ANA, anti-smooth muscle antibody)  Normal BMI (Body Mass Index- based on height and weight) is between 23 and 30. Your BMI today is Body mass index is 33.75 kg/m. Tonya Johns. Please consider follow up  regarding your BMI with your Primary Care Provider.

## 2016-12-06 DIAGNOSIS — M1711 Unilateral primary osteoarthritis, right knee: Secondary | ICD-10-CM | POA: Diagnosis not present

## 2016-12-06 DIAGNOSIS — M1712 Unilateral primary osteoarthritis, left knee: Secondary | ICD-10-CM | POA: Diagnosis not present

## 2016-12-08 LAB — MITOCHONDRIAL ANTIBODIES

## 2016-12-08 LAB — ANA: ANA: POSITIVE — AB

## 2016-12-08 LAB — HEPATITIS C ANTIBODY
HEP C AB: NONREACTIVE
SIGNAL TO CUT-OFF: 0.01 (ref <1.00)

## 2016-12-08 LAB — ANTI-NUCLEAR AB-TITER (ANA TITER)

## 2016-12-08 LAB — HEPATITIS B SURFACE ANTIGEN: Hepatitis B Surface Ag: NONREACTIVE

## 2016-12-08 LAB — ANTI-SMOOTH MUSCLE ANTIBODY, IGG: Actin (Smooth Muscle) Antibody (IGG): 23 U — ABNORMAL HIGH (ref <20)

## 2016-12-08 LAB — HEPATITIS A ANTIBODY, TOTAL: Hepatitis A AB,Total: REACTIVE — AB

## 2016-12-08 LAB — HEPATITIS B SURFACE ANTIBODY,QUALITATIVE: Hep B S Ab: NONREACTIVE

## 2016-12-09 ENCOUNTER — Other Ambulatory Visit: Payer: Self-pay | Admitting: Family Medicine

## 2016-12-09 DIAGNOSIS — R768 Other specified abnormal immunological findings in serum: Secondary | ICD-10-CM

## 2016-12-14 ENCOUNTER — Telehealth: Payer: Self-pay | Admitting: Family Medicine

## 2016-12-14 NOTE — Telephone Encounter (Signed)
Please have patient triaged by RN, and then referred to the Ortho Dr. Who will be doing the surgery.

## 2016-12-14 NOTE — Telephone Encounter (Signed)
Copied from CRM [phone]. Topic: Quick Communication - See Telephone Encounter >> Dec 14, 2016  3:51 PM Terisa Starraylor, Brittany L wrote: CRM for notification. See Telephone encounter for:    Patient is scheduled to have knee surgery on 12/10 and they she said she has a lot of inflammation & she wants to know if that needs to be addressed and will that interfere with the surgery. Please call her back at [phone] or [phone]   12/14/16.

## 2016-12-15 NOTE — Telephone Encounter (Signed)
Spoke with patient and she is talking about inflammation that is in the knee that they will be operating on.   Advised patient that she would need to contact the Ortho office that will be doing the surgery to see if there is something they can do until she has surgery next month.   Patient stated verbal understanding and said she will be contacting that office.

## 2017-01-02 ENCOUNTER — Ambulatory Visit (HOSPITAL_COMMUNITY)
Admission: RE | Admit: 2017-01-02 | Discharge: 2017-01-02 | Disposition: A | Discharge status: Home / Self Care | Payer: Medicare Other | Source: Ambulatory Visit | Attending: Orthopedic Surgery | Admitting: Orthopedic Surgery

## 2017-01-02 ENCOUNTER — Other Ambulatory Visit: Payer: Self-pay

## 2017-01-02 ENCOUNTER — Encounter (HOSPITAL_COMMUNITY)
Admission: RE | Admit: 2017-01-02 | Discharge: 2017-01-02 | Disposition: A | Discharge status: Home / Self Care | Payer: Medicare Other | Source: Ambulatory Visit | Attending: Orthopedic Surgery | Admitting: Orthopedic Surgery

## 2017-01-02 ENCOUNTER — Encounter (HOSPITAL_COMMUNITY): Payer: Self-pay

## 2017-01-02 DIAGNOSIS — R011 Cardiac murmur, unspecified: Secondary | ICD-10-CM | POA: Diagnosis not present

## 2017-01-02 DIAGNOSIS — I700 Atherosclerosis of aorta: Secondary | ICD-10-CM | POA: Insufficient documentation

## 2017-01-02 DIAGNOSIS — Z01818 Encounter for other preprocedural examination: Secondary | ICD-10-CM | POA: Insufficient documentation

## 2017-01-02 DIAGNOSIS — K219 Gastro-esophageal reflux disease without esophagitis: Secondary | ICD-10-CM | POA: Insufficient documentation

## 2017-01-02 DIAGNOSIS — M1990 Unspecified osteoarthritis, unspecified site: Secondary | ICD-10-CM | POA: Insufficient documentation

## 2017-01-02 DIAGNOSIS — I517 Cardiomegaly: Secondary | ICD-10-CM | POA: Diagnosis not present

## 2017-01-02 DIAGNOSIS — G709 Myoneural disorder, unspecified: Secondary | ICD-10-CM | POA: Insufficient documentation

## 2017-01-02 DIAGNOSIS — I444 Left anterior fascicular block: Secondary | ICD-10-CM | POA: Insufficient documentation

## 2017-01-02 DIAGNOSIS — Z01810 Encounter for preprocedural cardiovascular examination: Secondary | ICD-10-CM | POA: Diagnosis not present

## 2017-01-02 DIAGNOSIS — Z01812 Encounter for preprocedural laboratory examination: Secondary | ICD-10-CM | POA: Insufficient documentation

## 2017-01-02 HISTORY — DX: Anemia, unspecified: D64.9

## 2017-01-02 LAB — APTT: APTT: 34 s (ref 24–36)

## 2017-01-02 LAB — URINALYSIS, ROUTINE W REFLEX MICROSCOPIC
BILIRUBIN URINE: NEGATIVE
Glucose, UA: NEGATIVE mg/dL
HGB URINE DIPSTICK: NEGATIVE
Ketones, ur: NEGATIVE mg/dL
Nitrite: NEGATIVE
PH: 5 (ref 5.0–8.0)
Protein, ur: NEGATIVE mg/dL
Specific Gravity, Urine: 1.018 (ref 1.005–1.030)

## 2017-01-02 LAB — BASIC METABOLIC PANEL
Anion gap: 6 (ref 5–15)
BUN: 22 mg/dL — AB (ref 6–20)
CALCIUM: 9.2 mg/dL (ref 8.9–10.3)
CO2: 27 mmol/L (ref 22–32)
CREATININE: 1.3 mg/dL — AB (ref 0.44–1.00)
Chloride: 107 mmol/L (ref 101–111)
GFR calc Af Amer: 42 mL/min — ABNORMAL LOW (ref >60)
GFR, EST NON AFRICAN AMERICAN: 37 mL/min — AB (ref >60)
GLUCOSE: 86 mg/dL (ref 65–99)
Potassium: 3.9 mmol/L (ref 3.5–5.1)
Sodium: 140 mmol/L (ref 135–145)

## 2017-01-02 LAB — CBC WITH DIFFERENTIAL/PLATELET
BASOS PCT: 1 %
Basophils Absolute: 0 10*3/uL (ref 0.0–0.1)
EOS ABS: 0.1 10*3/uL (ref 0.0–0.7)
EOS PCT: 1 %
HCT: 34.9 % — ABNORMAL LOW (ref 36.0–46.0)
Hemoglobin: 11.5 g/dL — ABNORMAL LOW (ref 12.0–15.0)
Lymphocytes Relative: 33 %
Lymphs Abs: 2.2 10*3/uL (ref 0.7–4.0)
MCH: 30 pg (ref 26.0–34.0)
MCHC: 33 g/dL (ref 30.0–36.0)
MCV: 91.1 fL (ref 78.0–100.0)
MONO ABS: 0.4 10*3/uL (ref 0.1–1.0)
MONOS PCT: 6 %
Neutro Abs: 3.8 10*3/uL (ref 1.7–7.7)
Neutrophils Relative %: 59 %
PLATELETS: 228 10*3/uL (ref 150–400)
RBC: 3.83 MIL/uL — ABNORMAL LOW (ref 3.87–5.11)
RDW: 14.2 % (ref 11.5–15.5)
WBC: 6.5 10*3/uL (ref 4.0–10.5)

## 2017-01-02 LAB — SURGICAL PCR SCREEN
MRSA, PCR: NEGATIVE
Staphylococcus aureus: NEGATIVE

## 2017-01-02 LAB — PROTIME-INR
INR: 0.98
Prothrombin Time: 12.9 seconds (ref 11.4–15.2)

## 2017-01-02 LAB — ABO/RH: ABO/RH(D): O POS

## 2017-01-02 NOTE — Pre-Procedure Instructions (Signed)
Cheryln Manlylma Clear  01/02/2017      [id number] [place] - [address] [place] [id number] Phone: [phone] Fax: [phone]    Your procedure is scheduled on 01-09-2017  Monday .  [facility] at 5:30 A.M.  Call this number if you have problems the morning of surgery:  [phone]   Remember:  Do not eat food or drink liquids after midnight.   Take these medicines the morning of surgery with A SIP OF WATER Tylenol if needed,levothyroxine(Synthroid),Pravastatin(Pravachol)   STOP ASPIRIN,ANTIINFLAMATORIES (IBUPROFEN,ALEVE,MOTRIN,ADVIL,GOODY'S POWDERS),HERBAL SUPPLEMENTS,FISH OIL,AND VITAMINS 5-7 DAYS PRIOR TO SURGERY   Do not wear jewelry, make-up or nail polish.  Do not wear lotions, powders, or perfumes, or deoderant.  Do not shave 48 hours prior to surgery.  Men may shave face and neck.  Do not bring valuables to the hospital.  [facility] is not responsible for any belongings or valuables.  Contacts, dentures or bridgework may not be worn into surgery.  Leave your suitcase in the car.  After surgery it may be brought to your room.  For patients admitted to the hospital, discharge time will be determined by your treatment team.  Patients discharged the day of surgery will not be allowed to drive home.     Special Instructions: [facility] - Preparing for Surgery  Before surgery, you can play an important role.  Because skin is not sterile, your skin needs to be as free of germs as possible.  You can reduce the number of germs on you skin by washing with CHG (chlorahexidine gluconate) soap before surgery.  CHG is an antiseptic cleaner which kills germs and bonds with the skin to continue killing germs even after washing.  Please DO NOT use if you have an allergy to CHG or antibacterial soaps.  If your skin becomes reddened/irritated stop using the CHG and inform your nurse when you arrive at Short  Stay.  Do not shave (including legs and underarms) for at least 48 hours prior to the first CHG shower.  You may shave your face.  Please follow these instructions carefully:   1.  Shower with CHG Soap the night before surgery and the   morning of Surgery.  2.  If you choose to wash your hair, wash your hair first as usual with your normal shampoo.  3.  After you shampoo, rinse your hair and body thoroughly to remove the  Shampoo.  4.  Use CHG as you would any other liquid soap.  You can apply chg directly  to the skin and wash gently with scrungie or a clean washcloth.  5.  Apply the CHG Soap to your body ONLY FROM THE NECK DOWN.   Do not use on open wounds or open sores.  Avoid contact with your eyes,  ears, mouth and genitals (private parts).  Wash genitals (private parts) with your normal soap.  6.  Wash thoroughly, paying special attention to the area where your surgery will be performed.  7.  Thoroughly rinse your body with warm water from the neck down.  8.  DO NOT shower/wash with your normal soap after using and rinsing o  the CHG Soap.  9.  Pat yourself dry with a clean towel.            10.  Wear clean pajamas.            11.  Place clean sheets on your bed  the night of your first shower and do not sleep with pets.  Day of Surgery  Do not apply any lotions/deodorants the morning of surgery.  Please wear clean clothes to the hospital/surgery center.  Please read over the following fact sheets that you were given. MRSA Information and Surgical Site Infection Prevention

## 2017-01-03 NOTE — Progress Notes (Signed)
Anesthesia chart review:  Patient is an 83 year old female scheduled for left total knee arthroplasty in 01/09/2017 with Gean BirchwoodFrank Rowan, M.D.  - PCP is Neena RhymesKatherine Tabori, MD - Endocrinologist is Romero BellingSean Ellison, MD  - Saw cardiologist Kristeen MissPhilip Nahser, MD 12/15/10 for leg edema.  No further testing ordered, prn f/u recommended.   PMH includes: Hyperlipidemia, hypothyroidism, anemia, GERD. Never smoker. BMI 32.  Medications include: Levothyroxine, pravastatin  BP (!) 155/65   Pulse 70   Temp 36.8 C   Resp 20   Ht 5\' 4"  (1.626 m)   Wt 185 lb 4.8 oz (84.1 kg)   SpO2 100%   BMI 31.81 kg/m   Preoperative labs reviewed.    CXR 01/02/17:  - There is no active cardiopulmonary disease. - Thoracic aortic atherosclerosis.  EKG 01/02/17: NSR. LAFB. LVH. Nonspecific T wave abnormality  Echo 12/22/10:  - Left ventricle: The cavity size was normal. Wall thicknesswas normal. Systolic function was normal. The estimatedejection fraction was in the range of 60% to 65%. Wallmotion was normal; there were no regional wall motion abnormalities. Doppler parameters are consistent withabnormal left ventricular relaxation (grade 1 diastolicdysfunction). - Aortic valve: Mild regurgitation. - Left atrium: The atrium was mildly dilated. - Atrial septum: There was an atrial septal aneurysm. - Pulmonary arteries: Systolic pressure was mildlyincreased. PA peak pressure: 36mm Hg (S).  If no changes, I anticipate pt can proceed with surgery as scheduled.   Rica Mastngela Kabbe, FNP-BC [facility] Phone: [phone] 01/03/2017 1:23 PM

## 2017-01-05 ENCOUNTER — Encounter: Payer: Self-pay | Admitting: Cardiology

## 2017-01-05 ENCOUNTER — Ambulatory Visit: Payer: Medicare Other | Admitting: Cardiology

## 2017-01-05 VITALS — BP 162/80 | HR 70 | Ht 64.0 in | Wt 184.0 lb

## 2017-01-05 DIAGNOSIS — Z01810 Encounter for preprocedural cardiovascular examination: Secondary | ICD-10-CM | POA: Diagnosis not present

## 2017-01-05 DIAGNOSIS — R030 Elevated blood-pressure reading, without diagnosis of hypertension: Secondary | ICD-10-CM

## 2017-01-05 DIAGNOSIS — R011 Cardiac murmur, unspecified: Secondary | ICD-10-CM | POA: Diagnosis not present

## 2017-01-05 DIAGNOSIS — M1712 Unilateral primary osteoarthritis, left knee: Secondary | ICD-10-CM | POA: Diagnosis present

## 2017-01-05 NOTE — Patient Instructions (Addendum)
Medication Instructions:  Your physician recommends that you continue on your current medications as directed. Please refer to the Current Medication list given to you today.  Labwork: None  Testing/Procedures: None  Follow-Up: Your physician wants you to follow-up as needed if symptoms worsen or fail to improve.   Any Other Special Instructions Will Be Listed Below (If Applicable).     If you need a refill on your cardiac medications before your next appointment, please call your pharmacy.  Hypertension Hypertension is another name for high blood pressure. High blood pressure forces your heart to work harder to pump blood. This can cause problems over time. There are two numbers in a blood pressure reading. There is a top number (systolic) over a bottom number (diastolic). It is best to have a blood pressure below 120/80. Healthy choices can help lower your blood pressure. You may need medicine to help lower your blood pressure if:  Your blood pressure cannot be lowered with healthy choices.  Your blood pressure is higher than 130/80.  Follow these instructions at home: Eating and drinking  If directed, follow the DASH eating plan. This diet includes: ? Filling half of your plate at each meal with fruits and vegetables. ? Filling one quarter of your plate at each meal with whole grains. Whole grains include whole wheat pasta, brown rice, and whole grain bread. ? Eating or drinking low-fat dairy products, such as skim milk or low-fat yogurt. ? Filling one quarter of your plate at each meal with low-fat (lean) proteins. Low-fat proteins include fish, skinless chicken, eggs, beans, and tofu. ? Avoiding fatty meat, cured and processed meat, or chicken with skin. ? Avoiding premade or processed food.  Eat less than 1,500 mg of salt (sodium) a day.  Limit alcohol use to no more than 1 drink a day for nonpregnant women and 2 drinks a day for men. One drink equals 12 oz of beer, 5  oz of wine, or 1 oz of hard liquor. Lifestyle  Work with your doctor to stay at a healthy weight or to lose weight. Ask your doctor what the best weight is for you.  Get at least 30 minutes of exercise that causes your heart to beat faster (aerobic exercise) most days of the week. This may include walking, swimming, or biking.  Get at least 30 minutes of exercise that strengthens your muscles (resistance exercise) at least 3 days a week. This may include lifting weights or pilates.  Do not use any products that contain nicotine or tobacco. This includes cigarettes and e-cigarettes. If you need help quitting, ask your doctor.  Check your blood pressure at home as told by your doctor.  Keep all follow-up visits as told by your doctor. This is important. Medicines  Take over-the-counter and prescription medicines only as told by your doctor. Follow directions carefully.  Do not skip doses of blood pressure medicine. The medicine does not work as well if you skip doses. Skipping doses also puts you at risk for problems.  Ask your doctor about side effects or reactions to medicines that you should watch for. Contact a doctor if:  You think you are having a reaction to the medicine you are taking.  You have headaches that keep coming back (recurring).  You feel dizzy.  You have swelling in your ankles.  You have trouble with your vision. Get help right away if:  You get a very bad headache.  You start to feel confused.  You feel  weak or numb.  You feel faint.  You get very bad pain in your: ? Chest. ? Belly (abdomen).  You throw up (vomit) more than once.  You have trouble breathing. Summary  Hypertension is another name for high blood pressure.  Making healthy choices can help lower blood pressure. If your blood pressure cannot be controlled with healthy choices, you may need to take medicine. This information is not intended to replace advice given to you by your  health care provider. Make sure you discuss any questions you have with your health care provider. Document Released: 07/06/2007 Document Revised: 12/16/2015 Document Reviewed: 12/16/2015 Elsevier Interactive Patient Education  [employer]2018 [employer]

## 2017-01-05 NOTE — H&P (Signed)
TOTAL KNEE ADMISSION H&P  Patient is being admitted for left total knee arthroplasty.  Subjective:  Chief Complaint:left knee pain.  HPI: Tonya Johns, 83 y.o. female, has a history of pain and functional disability in the left knee due to arthritis and has failed non-surgical conservative treatments for greater than 12 weeks to includeNSAID's and/or analgesics, corticosteriod injections, viscosupplementation injections, use of assistive devices and activity modification.  Onset of symptoms was gradual, starting 6 years ago with gradually worsening course since that time. The patient noted no past surgery on the left knee(s).  Patient currently rates pain in the left knee(s) at 10 out of 10 with activity. Patient has night pain, worsening of pain with activity and weight bearing, pain that interferes with activities of daily living, pain with passive range of motion, crepitus and joint swelling.  Patient has evidence of subchondral sclerosis, joint subluxation and joint space narrowing by imaging studies.   There is no active infection.  Patient Active Problem List   Diagnosis Date Noted  . Contact dermatitis and eczema due to plant 06/30/2015  . Elevated BP 03/09/2015  . Lumbar back pain 05/22/2014  . Abnormal urine odor 05/22/2014  . Uterine prolapse 02/19/2014  . Hypothyroidism following radioiodine therapy 07/26/2011  . Elevated glucose 04/07/2011  . Multinodular goiter 12/10/2010  . Knee pain, bilateral 11/25/2010  . Fatigue 11/25/2010  . Depression 11/25/2010  . Heart murmur 11/25/2010  . Preop cardiovascular exam 07/14/2010  . Trapezius muscle spasm 06/15/2010  . GERD (gastroesophageal reflux disease) 06/15/2010  . Hyperlipidemia 06/15/2010   Past Medical History:  Diagnosis Date  . Anemia   . Arthritis   . Diverticulitis   . GERD (gastroesophageal reflux disease)   . Hyperlipidemia   . Hypothyroidism   . Leg swelling   . Osteoporosis     Past Surgical History:   Procedure Laterality Date  . EYE SURGERY Bilateral    cataract removal  . NO PAST SURGERIES      No current facility-administered medications for this encounter.    Current Outpatient Medications  Medication Sig Dispense Refill Last Dose  . acetaminophen (TYLENOL) 500 MG tablet Take 1,000 mg by mouth every 6 (six) hours as needed for moderate pain or headache.     . Coenzyme Q10 (CO Q10) 100 MG CAPS Take 100 mg by mouth daily.     Taking  . diclofenac sodium (VOLTAREN) 1 % GEL Apply 1 application topically 4 (four) times daily as needed (for knee pain).     Marland Kitchen. levothyroxine (SYNTHROID, LEVOTHROID) 100 MCG tablet TAKE 1 TABLET BY MOUTH  DAILY (Patient taking differently: Take 100 mcg by mouth once daily) 90 tablet 1 Taking  . Multiple Vitamins-Minerals (MULTIVITAMIN PO) Take 1 tablet by mouth daily.   Taking  . pravastatin (PRAVACHOL) 80 MG tablet TAKE 1 TABLET BY MOUTH  DAILY (Patient taking differently: Take 80 mg by mouth daily) 90 tablet 1 Taking   Allergies  Allergen Reactions  . Penicillins Rash and Other (See Comments)    Has patient had a PCN reaction causing immediate rash, facial/tongue/throat swelling, SOB or lightheadedness with hypotension: Yes Has patient had a PCN reaction causing severe rash involving mucus membranes or skin necrosis: No Has patient had a PCN reaction that required hospitalization: No Has patient had a PCN reaction occurring within the last 10 years: Yes If all of the above answers are "NO", then may proceed with Cephalosporin use.     Social History   Tobacco Use  .  Smoking status: Never Smoker  . Smokeless tobacco: Never Used  Substance Use Topics  . Alcohol use: No    Family History  Problem Relation Age of Onset  . Alcohol abuse Brother   . Alcohol abuse Sister   . Arthritis Mother   . Stroke Mother   . Hypertension Mother   . Arthritis Father   . Heart disease Father   . Hyperlipidemia Brother   . Hyperlipidemia Sister   . Sudden  death Brother   . Mental illness Sister   . Diabetes Sister      Review of Systems  Constitutional: Negative.   HENT: Negative.   Eyes: Negative.   Respiratory: Negative.   Cardiovascular: Negative.   Gastrointestinal: Negative.   Genitourinary: Negative.   Musculoskeletal: Positive for joint pain.  Skin: Negative.   Neurological: Negative.   Endo/Heme/Allergies: Negative.   Psychiatric/Behavioral: Negative.     Objective:  Physical Exam  Constitutional: She is oriented to person, place, and time. She appears well-developed and well-nourished.  HENT:  Head: Normocephalic and atraumatic.  Eyes: Pupils are equal, round, and reactive to light.  Neck: Normal range of motion. Neck supple.  Cardiovascular: Intact distal pulses.  Respiratory: Effort normal.  Musculoskeletal: She exhibits tenderness.  the patient has a range from 5 to 115.  No instability.  She does have diffuse tenderness but more so over the medial joint line.  Mild swelling.  No erythema or warmth.  Calves are soft and nontender.  Neurological: She is alert and oriented to person, place, and time.  Skin: Skin is warm and dry.  Psychiatric: She has a normal mood and affect. Her behavior is normal. Judgment and thought content normal.    Vital signs in last 24 hours:    Labs:   Estimated body mass index is 31.81 kg/m as calculated from the following:   Height as of 01/02/17: 5\' 4"  (1.626 m).   Weight as of 01/02/17: 84.1 kg (185 lb 4.8 oz).   Imaging Review Plain radiographs demonstrate advanced end-stage knee DJD of the medial compartment with subchondral sclerosing osteophytes appreciated.  She has moderately advanced DJD of the patellofemoral joints.   Assessment/Plan:  End stage arthritis, left knee   The patient history, physical examination, clinical judgment of the provider and imaging studies are consistent with end stage degenerative joint disease of the left knee(s) and total knee  arthroplasty is deemed medically necessary. The treatment options including medical management, injection therapy arthroscopy and arthroplasty were discussed at length. The risks and benefits of total knee arthroplasty were presented and reviewed. The risks due to aseptic loosening, infection, stiffness, patella tracking problems, thromboembolic complications and other imponderables were discussed. The patient acknowledged the explanation, agreed to proceed with the plan and consent was signed. Patient is being admitted for inpatient treatment for surgery, pain control, PT, OT, prophylactic antibiotics, VTE prophylaxis, progressive ambulation and ADL's and discharge planning. The patient is planning to be discharged home with home health services.

## 2017-01-05 NOTE — Progress Notes (Signed)
Cardiology Office Note:    Date:  01/05/2017   ID:  Tonya ManlyElma Bolt, DOB [birth year], MRN [id number]  PCP:  Sheliah Hatchabori, Katherine E, MD  Cardiologist:  Norman HerrlichBrian Marvine Encalade, MD   Referring MD: Sheliah Hatchabori, Katherine E, MD  ASSESSMENT:    1. Preop cardiovascular exam   2. Elevated blood pressure reading in office without diagnosis of hypertension   3. Heart murmur    PLAN:     1. Preoperative cardiovascular evaluation Surgeon: dr Turner Danielsowan Procedure: TKA The surgery is elective Active cardiac problems  EKG with LAHB. The planned procedure is intermediate risk. The cardiac risk factors are age and statin treated hyperlipidemia The functional capacity is 4 mets or greater yes Recent cardiac tests performed include an EKG with LAHB similar to 2012 Given the above his overall risk for the planned procedure is low/acceptable Antiplatelet/ anticoagulant recommendation: None Other cardiac medication or device recommendation: None Anesthesia recommendation: None. Observation, monitoring,and postoperative test recommendation: Telemetry bed for 24 hours after surgery check EKG postoperative day 1.  She has a history of intermittent elevation of blood pressure but is not been treated for hypertension and potentially requires antihypertensive therapy perioperatively. The patient is optimized from a cardiology perspective: Yes  2.  She relates intermittent elevation of blood pressure has been told by her PCP that she does not require medical treatment she is moderately elevated in the office today and is at risk for perioperative hypertension and may require treatment in hospital.  3.  Stable by physical examination I do not think she requires a repeat echocardiogram with mild aortic regurgitation.  Next appointment as needed   Medication Adjustments/Labs and Tests Ordered: Current medicines are reviewed at length with the patient today.  Concerns regarding medicines are outlined above.  No orders of the defined  types were placed in this encounter.  No orders of the defined types were placed in this encounter.    Chief Complaint  Patient presents with  . Pre-op Exam    History of Present Illness:    Tonya Johns is a 83 y.o. female with acquired hypothyroidism after RAI thyroid ablation, and yperlipidemia who is being seen today for preoperative cardiology evaluation of  at the request of DR Turner Danielsowan. She has a stable pattern of left axis deviation left anterior hemiblock on EKG 2012 and preoperatively this visit.  [name].  She relates that blood pressure outside of doctor's offices is at target 03/02/1936 systolic but she has a systolic today of 160 diastolic of 80 on my repeat blood pressure.  She is not on antihypertensive therapy  Past Medical History:  Diagnosis Date  . Anemia   . Arthritis   . Diverticulitis   . GERD (gastroesophageal reflux disease)   . Hyperlipidemia   . Hypothyroidism   . Leg swelling   . Osteoporosis     Past Surgical History:  Procedure Laterality Date  . EYE SURGERY Bilateral    cataract removal  . NO PAST SURGERIES      Current Medications: Current Meds  Medication Sig  . acetaminophen (TYLENOL) 500 MG tablet Take 1,000 mg by mouth every 6 (six) hours as needed for moderate pain or headache.  . Coenzyme Q10 (CO Q10) 100 MG CAPS Take 100 mg by mouth daily.    . diclofenac sodium (VOLTAREN) 1 %  GEL Apply 1 application topically 4 (four) times daily as needed (for knee pain).  Marland Kitchen. levothyroxine (SYNTHROID, LEVOTHROID) 100 MCG tablet TAKE 1 TABLET BY MOUTH  DAILY  . Multiple Vitamins-Minerals (MULTIVITAMIN PO) Take 1 tablet by mouth daily.  . pravastatin (PRAVACHOL) 80 MG tablet TAKE 1 TABLET BY MOUTH  DAILY     Allergies:   Penicillins   Social History    Socioeconomic History  . Marital status: Single    Spouse name: None  . Number of children: 3  . Years of education: None  . Highest education level: None  Social Needs  . Financial resource strain: None  . Food insecurity - worry: None  . Food insecurity - inability: None  . Transportation needs - medical: None  . Transportation needs - non-medical: None  Occupational History  . Occupation: retired  Tobacco Use  . Smoking status: Never Smoker  . Smokeless tobacco: Never Used  Substance and Sexual Activity  . Alcohol use: No  . Drug use: No  . Sexual activity: None  Other Topics Concern  . None  Social History Narrative  . None     Family History: The patient's family history includes Alcohol abuse in her brother and sister; Arthritis in her father and mother; Diabetes in her sister; Heart disease in her father; Hyperlipidemia in her brother and sister; Hypertension in her mother; Mental illness in her sister; Stroke in her mother; Sudden death in her brother.  ROS:   ROS Please see the history of present illness.     All other systems reviewed and are negative.  EKGs/Labs/Other Studies Reviewed:    The following studies were reviewed today:   EKG:  EKG 01/02/17 showed SRTH LAHB, similar to the EKG of 12/15/10  Echo 12/22/10: Study Conclusions - Left ventricle: The cavity size was normal. Wall thickness was normal. Systolic function was normal. The estimated ejection fraction was in the range of 60% to 65%. Wall motion was normal; there were no regional wall motion abnormalities. Doppler parameters are consistent with abnormal left ventricular relaxation (grade 1 diastolic dysfunction). - Aortic valve: Mild regurgitation. - Left atrium: The atrium was mildly dilated. - Atrial septum: There was an atrial septal aneurysm  CXR 01/02/17:FINDINGS: The lungs are adequately inflated and clear. The heart and pulmonary vascularity are normal. The  mediastinum is normal in width. There is no pleural effusion. There is calcification in the wall of the aortic arch. There is multilevel degenerative disc disease of the thoracic spine. IMPRESSION: There is no active cardiopulmonary disease.  Recent Labs: 09/20/2016: TSH 1.06 12/02/2016: ALT 9 01/02/2017: BUN 22; Creatinine, Ser 1.30; Hemoglobin 11.5; Platelets 228; Potassium 3.9; Sodium 140  Recent Lipid Panel    Component Value Date/Time   CHOL 183 09/20/2016 1006   TRIG 107.0 09/20/2016 1006   HDL 58.00 09/20/2016 1006   CHOLHDL 3 09/20/2016 1006   VLDL 21.4 09/20/2016 1006   LDLCALC 104 (H) 09/20/2016 1006   LDLDIRECT 124.0 09/14/2015 1355    Physical Exam:    VS:  BP (!) 162/80   Pulse 70   Ht 5\' 4"  (1.626 m)   Wt 184 lb (83.5 kg)   SpO2 99%   BMI 31.58 kg/m     Wt Readings from Last 3 Encounters:  01/05/17 184 lb (83.5 kg)  01/02/17 185 lb 4.8 oz (84.1 kg)  12/02/16 184 lb 8 oz (83.7 kg)     GEN:  Well nourished, well developed in no acute distress HEENT:  Normal NECK: No JVD; No carotid bruits LYMPHATICS: No lymphadenopathy CARDIAC: Grade 1/6 murmur aortic area along left sternal border aortic regurgitation S2 is normal no murmur aortic stenosis RRR, rubs, gallops RESPIRATORY:  Clear to auscultation without rales, wheezing or rhonchi  ABDOMEN: Soft, non-tender, non-distended MUSCULOSKELETAL:  No edema; No deformity  SKIN: Warm and dry NEUROLOGIC:  Alert and oriented x 3 PSYCHIATRIC:  Normal affect     Signed, Norman Herrlich, MD  01/05/2017 2:13 PM    [facility]

## 2017-01-06 MED ORDER — VANCOMYCIN HCL IN DEXTROSE 1-5 GM/200ML-% IV SOLN
1000.0000 mg | INTRAVENOUS | Status: AC
  Administered 2017-01-09: 1000 mg via INTRAVENOUS
  Filled 2017-01-06: qty 200

## 2017-01-06 MED ORDER — LACTATED RINGERS IV SOLN
INTRAVENOUS | Status: DC
  Administered 2017-01-09 (×2): via INTRAVENOUS

## 2017-01-06 MED ORDER — BUPIVACAINE LIPOSOME 1.3 % IJ SUSP
20.0000 mL | INTRAMUSCULAR | Status: AC
  Administered 2017-01-09: 20 mL
  Filled 2017-01-06: qty 20

## 2017-01-06 MED ORDER — TRANEXAMIC ACID 1000 MG/10ML IV SOLN
1000.0000 mg | INTRAVENOUS | Status: AC
  Administered 2017-01-09: 1000 mg via INTRAVENOUS
  Filled 2017-01-06: qty 1100

## 2017-01-08 NOTE — Anesthesia Preprocedure Evaluation (Addendum)
Anesthesia Evaluation  Patient identified by MRN, date of birth, ID band Patient awake    Reviewed: Allergy & Precautions, NPO status , Patient's Chart, lab work & pertinent test results  Airway Mallampati: II   Neck ROM: Full    Dental  (+) Edentulous Upper, Edentulous Lower   Pulmonary neg pulmonary ROS,    breath sounds clear to auscultation       Cardiovascular negative cardio ROS  + Valvular Problems/Murmurs  Rhythm:Regular Rate:Normal     Neuro/Psych  Neuromuscular disease    GI/Hepatic Neg liver ROS, GERD  ,  Endo/Other  Hypothyroidism   Renal/GU negative Renal ROS     Musculoskeletal  (+) Arthritis ,   Abdominal   Peds  Hematology   Anesthesia Other Findings   Reproductive/Obstetrics                            Anesthesia Physical Anesthesia Plan  ASA: III  Anesthesia Plan: Spinal   Post-op Pain Management:  Regional for Post-op pain   Induction: Intravenous  PONV Risk Score and Plan: 3 and Dexamethasone and Ondansetron  Airway Management Planned: Natural Airway and Simple Face Mask  Additional Equipment:   Intra-op Plan:   Post-operative Plan:   Informed Consent:   Plan Discussed with: CRNA  Anesthesia Plan Comments:        Anesthesia Quick Evaluation

## 2017-01-09 ENCOUNTER — Inpatient Hospital Stay (HOSPITAL_COMMUNITY): Payer: Medicare Other | Admitting: Anesthesiology

## 2017-01-09 ENCOUNTER — Inpatient Hospital Stay (HOSPITAL_COMMUNITY)
Admission: RE | Admit: 2017-01-09 | Discharge: 2017-01-13 | DRG: 470 | Disposition: A | Discharge status: Skilled Nursing Facility | Payer: Medicare Other | Source: Ambulatory Visit | Attending: Orthopedic Surgery | Admitting: Orthopedic Surgery

## 2017-01-09 ENCOUNTER — Other Ambulatory Visit: Payer: Self-pay

## 2017-01-09 ENCOUNTER — Encounter (HOSPITAL_COMMUNITY)
Admission: RE | Disposition: A | Discharge status: Skilled Nursing Facility | Payer: Self-pay | Source: Ambulatory Visit | Attending: Orthopedic Surgery

## 2017-01-09 ENCOUNTER — Encounter (HOSPITAL_COMMUNITY): Payer: Self-pay | Admitting: Urology

## 2017-01-09 ENCOUNTER — Inpatient Hospital Stay (HOSPITAL_COMMUNITY): Payer: Medicare Other | Admitting: Emergency Medicine

## 2017-01-09 DIAGNOSIS — E785 Hyperlipidemia, unspecified: Secondary | ICD-10-CM | POA: Diagnosis not present

## 2017-01-09 DIAGNOSIS — Z79899 Other long term (current) drug therapy: Secondary | ICD-10-CM | POA: Diagnosis not present

## 2017-01-09 DIAGNOSIS — R488 Other symbolic dysfunctions: Secondary | ICD-10-CM | POA: Diagnosis not present

## 2017-01-09 DIAGNOSIS — Z818 Family history of other mental and behavioral disorders: Secondary | ICD-10-CM

## 2017-01-09 DIAGNOSIS — Z79890 Hormone replacement therapy (postmenopausal): Secondary | ICD-10-CM | POA: Diagnosis not present

## 2017-01-09 DIAGNOSIS — Z880 Allergy status to penicillin: Secondary | ICD-10-CM

## 2017-01-09 DIAGNOSIS — Z471 Aftercare following joint replacement surgery: Secondary | ICD-10-CM | POA: Diagnosis not present

## 2017-01-09 DIAGNOSIS — M1712 Unilateral primary osteoarthritis, left knee: Secondary | ICD-10-CM | POA: Diagnosis not present

## 2017-01-09 DIAGNOSIS — R278 Other lack of coordination: Secondary | ICD-10-CM | POA: Diagnosis not present

## 2017-01-09 DIAGNOSIS — Z823 Family history of stroke: Secondary | ICD-10-CM

## 2017-01-09 DIAGNOSIS — S8002XA Contusion of left knee, initial encounter: Secondary | ICD-10-CM | POA: Diagnosis not present

## 2017-01-09 DIAGNOSIS — Z8349 Family history of other endocrine, nutritional and metabolic diseases: Secondary | ICD-10-CM | POA: Diagnosis not present

## 2017-01-09 DIAGNOSIS — Z9842 Cataract extraction status, left eye: Secondary | ICD-10-CM

## 2017-01-09 DIAGNOSIS — Z8249 Family history of ischemic heart disease and other diseases of the circulatory system: Secondary | ICD-10-CM

## 2017-01-09 DIAGNOSIS — Z811 Family history of alcohol abuse and dependence: Secondary | ICD-10-CM

## 2017-01-09 DIAGNOSIS — M810 Age-related osteoporosis without current pathological fracture: Secondary | ICD-10-CM | POA: Diagnosis present

## 2017-01-09 DIAGNOSIS — Z9841 Cataract extraction status, right eye: Secondary | ICD-10-CM

## 2017-01-09 DIAGNOSIS — R011 Cardiac murmur, unspecified: Secondary | ICD-10-CM | POA: Diagnosis present

## 2017-01-09 DIAGNOSIS — M6281 Muscle weakness (generalized): Secondary | ICD-10-CM | POA: Diagnosis not present

## 2017-01-09 DIAGNOSIS — M25762 Osteophyte, left knee: Secondary | ICD-10-CM | POA: Diagnosis not present

## 2017-01-09 DIAGNOSIS — Z8261 Family history of arthritis: Secondary | ICD-10-CM | POA: Diagnosis not present

## 2017-01-09 DIAGNOSIS — E890 Postprocedural hypothyroidism: Secondary | ICD-10-CM | POA: Diagnosis present

## 2017-01-09 DIAGNOSIS — G8911 Acute pain due to trauma: Secondary | ICD-10-CM | POA: Diagnosis not present

## 2017-01-09 DIAGNOSIS — Z96652 Presence of left artificial knee joint: Secondary | ICD-10-CM | POA: Diagnosis not present

## 2017-01-09 DIAGNOSIS — D62 Acute posthemorrhagic anemia: Secondary | ICD-10-CM | POA: Diagnosis not present

## 2017-01-09 DIAGNOSIS — R262 Difficulty in walking, not elsewhere classified: Secondary | ICD-10-CM | POA: Diagnosis not present

## 2017-01-09 DIAGNOSIS — K219 Gastro-esophageal reflux disease without esophagitis: Secondary | ICD-10-CM | POA: Diagnosis not present

## 2017-01-09 DIAGNOSIS — Z833 Family history of diabetes mellitus: Secondary | ICD-10-CM

## 2017-01-09 DIAGNOSIS — G8918 Other acute postprocedural pain: Secondary | ICD-10-CM | POA: Diagnosis not present

## 2017-01-09 HISTORY — PX: TOTAL KNEE ARTHROPLASTY: SHX125

## 2017-01-09 SURGERY — ARTHROPLASTY, KNEE, TOTAL
Anesthesia: Spinal | Site: Knee | Laterality: Left

## 2017-01-09 MED ORDER — BUPIVACAINE-EPINEPHRINE (PF) 0.25% -1:200000 IJ SOLN
INTRAMUSCULAR | Status: AC
  Filled 2017-01-09: qty 30

## 2017-01-09 MED ORDER — 0.9 % SODIUM CHLORIDE (POUR BTL) OPTIME
TOPICAL | Status: DC | PRN
  Administered 2017-01-09: 1000 mL

## 2017-01-09 MED ORDER — PROPOFOL 10 MG/ML IV BOLUS
INTRAVENOUS | Status: AC
  Filled 2017-01-09: qty 20

## 2017-01-09 MED ORDER — BUPIVACAINE IN DEXTROSE 0.75-8.25 % IT SOLN
INTRATHECAL | Status: DC | PRN
  Administered 2017-01-09: 1.6 mL via INTRATHECAL

## 2017-01-09 MED ORDER — FENTANYL CITRATE (PF) 250 MCG/5ML IJ SOLN
INTRAMUSCULAR | Status: AC
  Filled 2017-01-09: qty 5

## 2017-01-09 MED ORDER — FENTANYL CITRATE (PF) 100 MCG/2ML IJ SOLN
INTRAMUSCULAR | Status: AC
  Administered 2017-01-09: 50 ug via INTRAVENOUS
  Filled 2017-01-09: qty 2

## 2017-01-09 MED ORDER — TRANEXAMIC ACID 1000 MG/10ML IV SOLN
1000.0000 mg | Freq: Once | INTRAVENOUS | Status: AC
  Administered 2017-01-09: 1000 mg via INTRAVENOUS
  Filled 2017-01-09: qty 10

## 2017-01-09 MED ORDER — FLEET ENEMA 7-19 GM/118ML RE ENEM: 1.0000 | ENEMA | Freq: Once | RECTAL | Status: DC | PRN

## 2017-01-09 MED ORDER — OXYCODONE-ACETAMINOPHEN 5-325 MG PO TABS: 1.0000 | ORAL_TABLET | ORAL | 0 refills | Status: DC | PRN

## 2017-01-09 MED ORDER — ACETAMINOPHEN 650 MG RE SUPP: 650.0000 mg | RECTAL | Status: DC | PRN

## 2017-01-09 MED ORDER — GABAPENTIN 300 MG PO CAPS
300.0000 mg | ORAL_CAPSULE | Freq: Three times a day (TID) | ORAL | Status: DC
  Administered 2017-01-09 – 2017-01-13 (×13): 300 mg via ORAL
  Filled 2017-01-09 (×13): qty 1

## 2017-01-09 MED ORDER — DOCUSATE SODIUM 100 MG PO CAPS
100.0000 mg | ORAL_CAPSULE | Freq: Two times a day (BID) | ORAL | Status: DC
  Administered 2017-01-09 – 2017-01-13 (×8): 100 mg via ORAL
  Filled 2017-01-09 (×8): qty 1

## 2017-01-09 MED ORDER — KCL IN DEXTROSE-NACL 20-5-0.45 MEQ/L-%-% IV SOLN
INTRAVENOUS | Status: DC
  Administered 2017-01-09 (×2): via INTRAVENOUS
  Filled 2017-01-09 (×4): qty 1000

## 2017-01-09 MED ORDER — PROPOFOL 500 MG/50ML IV EMUL
INTRAVENOUS | Status: DC | PRN
  Administered 2017-01-09: 75 ug/kg/min via INTRAVENOUS

## 2017-01-09 MED ORDER — FENTANYL CITRATE (PF) 100 MCG/2ML IJ SOLN
INTRAMUSCULAR | Status: AC
  Administered 2017-01-09: 13:00:00
  Filled 2017-01-09: qty 2

## 2017-01-09 MED ORDER — ASPIRIN EC 325 MG PO TBEC
325.0000 mg | DELAYED_RELEASE_TABLET | Freq: Every day | ORAL | Status: DC
  Administered 2017-01-10 – 2017-01-13 (×4): 325 mg via ORAL
  Filled 2017-01-09 (×4): qty 1

## 2017-01-09 MED ORDER — HYDROMORPHONE HCL 1 MG/ML IJ SOLN: 0.5000 mg | INTRAMUSCULAR | Status: DC | PRN

## 2017-01-09 MED ORDER — PRAVASTATIN SODIUM 80 MG PO TABS
80.0000 mg | ORAL_TABLET | Freq: Every day | ORAL | Status: DC
  Administered 2017-01-10 – 2017-01-13 (×4): 80 mg via ORAL
  Filled 2017-01-09 (×6): qty 1

## 2017-01-09 MED ORDER — SENNOSIDES-DOCUSATE SODIUM 8.6-50 MG PO TABS: 1.0000 | ORAL_TABLET | Freq: Every evening | ORAL | Status: DC | PRN

## 2017-01-09 MED ORDER — BISACODYL 5 MG PO TBEC
5.0000 mg | DELAYED_RELEASE_TABLET | Freq: Every day | ORAL | Status: DC | PRN
  Administered 2017-01-11 – 2017-01-12 (×2): 5 mg via ORAL
  Filled 2017-01-09 (×2): qty 1

## 2017-01-09 MED ORDER — METHOCARBAMOL 500 MG PO TABS
500.0000 mg | ORAL_TABLET | Freq: Four times a day (QID) | ORAL | Status: DC | PRN
  Administered 2017-01-09 – 2017-01-13 (×8): 500 mg via ORAL
  Filled 2017-01-09 (×9): qty 1

## 2017-01-09 MED ORDER — ROPIVACAINE HCL 7.5 MG/ML IJ SOLN
INTRAMUSCULAR | Status: DC | PRN
  Administered 2017-01-09: 20 mL via PERINEURAL

## 2017-01-09 MED ORDER — DEXAMETHASONE SODIUM PHOSPHATE 10 MG/ML IJ SOLN
10.0000 mg | Freq: Once | INTRAMUSCULAR | Status: AC
  Administered 2017-01-10: 10 mg via INTRAVENOUS
  Filled 2017-01-09: qty 1

## 2017-01-09 MED ORDER — METOCLOPRAMIDE HCL 5 MG PO TABS: 5.0000 mg | ORAL_TABLET | Freq: Three times a day (TID) | ORAL | Status: DC | PRN

## 2017-01-09 MED ORDER — ONDANSETRON HCL 4 MG/2ML IJ SOLN: 4.0000 mg | Freq: Four times a day (QID) | INTRAMUSCULAR | Status: DC | PRN

## 2017-01-09 MED ORDER — METOCLOPRAMIDE HCL 5 MG/ML IJ SOLN: 5.0000 mg | Freq: Three times a day (TID) | INTRAMUSCULAR | Status: DC | PRN

## 2017-01-09 MED ORDER — FENTANYL CITRATE (PF) 100 MCG/2ML IJ SOLN
INTRAMUSCULAR | Status: DC | PRN
  Administered 2017-01-09: 50 ug via INTRAVENOUS
  Administered 2017-01-09: 25 ug via INTRAVENOUS
  Administered 2017-01-09: 50 ug via INTRAVENOUS
  Administered 2017-01-09: 25 ug via INTRAVENOUS

## 2017-01-09 MED ORDER — ONDANSETRON HCL 4 MG/2ML IJ SOLN
INTRAMUSCULAR | Status: DC | PRN
  Administered 2017-01-09: 4 mg via INTRAVENOUS

## 2017-01-09 MED ORDER — DIPHENHYDRAMINE HCL 12.5 MG/5ML PO ELIX: 12.5000 mg | ORAL_SOLUTION | ORAL | Status: DC | PRN

## 2017-01-09 MED ORDER — CHLORHEXIDINE GLUCONATE 4 % EX LIQD: 60.0000 mL | Freq: Once | CUTANEOUS | Status: DC

## 2017-01-09 MED ORDER — ACETAMINOPHEN 325 MG PO TABS
650.0000 mg | ORAL_TABLET | ORAL | Status: DC | PRN
  Administered 2017-01-12 – 2017-01-13 (×4): 650 mg via ORAL
  Filled 2017-01-09 (×4): qty 2

## 2017-01-09 MED ORDER — OXYCODONE HCL 5 MG PO TABS
5.0000 mg | ORAL_TABLET | ORAL | Status: DC | PRN
  Administered 2017-01-09 – 2017-01-13 (×8): 5 mg via ORAL
  Filled 2017-01-09 (×8): qty 1

## 2017-01-09 MED ORDER — EPHEDRINE SULFATE 50 MG/ML IJ SOLN
INTRAMUSCULAR | Status: DC | PRN
  Administered 2017-01-09: 5 mg via INTRAVENOUS

## 2017-01-09 MED ORDER — ASPIRIN EC 325 MG PO TBEC: 325.0000 mg | DELAYED_RELEASE_TABLET | Freq: Two times a day (BID) | ORAL | 0 refills | Status: DC

## 2017-01-09 MED ORDER — PHENYLEPHRINE HCL 10 MG/ML IJ SOLN
INTRAVENOUS | Status: DC | PRN
  Administered 2017-01-09: 40 ug/min via INTRAVENOUS

## 2017-01-09 MED ORDER — LEVOTHYROXINE SODIUM 100 MCG PO TABS
100.0000 ug | ORAL_TABLET | Freq: Every day | ORAL | Status: DC
  Administered 2017-01-10 – 2017-01-13 (×4): 100 ug via ORAL
  Filled 2017-01-09 (×4): qty 1

## 2017-01-09 MED ORDER — TRANEXAMIC ACID 1000 MG/10ML IV SOLN
2000.0000 mg | INTRAVENOUS | Status: AC
  Administered 2017-01-09: 2000 mg via TOPICAL
  Filled 2017-01-09: qty 20

## 2017-01-09 MED ORDER — ONDANSETRON HCL 4 MG PO TABS: 4.0000 mg | ORAL_TABLET | Freq: Four times a day (QID) | ORAL | Status: DC | PRN

## 2017-01-09 MED ORDER — OXYCODONE HCL 5 MG PO TABS
10.0000 mg | ORAL_TABLET | ORAL | Status: DC | PRN
  Administered 2017-01-09 – 2017-01-13 (×7): 10 mg via ORAL
  Filled 2017-01-09 (×7): qty 2

## 2017-01-09 MED ORDER — FENTANYL CITRATE (PF) 100 MCG/2ML IJ SOLN
25.0000 ug | INTRAMUSCULAR | Status: DC | PRN
Start: 2017-01-09 — End: 2017-01-09
  Administered 2017-01-09 (×3): 50 ug via INTRAVENOUS

## 2017-01-09 MED ORDER — DEXAMETHASONE SODIUM PHOSPHATE 4 MG/ML IJ SOLN
INTRAMUSCULAR | Status: DC | PRN
  Administered 2017-01-09: 10 mg via INTRAVENOUS

## 2017-01-09 MED ORDER — ALUM & MAG HYDROXIDE-SIMETH 200-200-20 MG/5ML PO SUSP
30.0000 mL | ORAL | Status: DC | PRN
  Administered 2017-01-12: 30 mL via ORAL
  Filled 2017-01-09: qty 30

## 2017-01-09 MED ORDER — MENTHOL 3 MG MT LOZG
1.0000 | LOZENGE | OROMUCOSAL | Status: DC | PRN
Start: 2017-01-09 — End: 2017-01-13

## 2017-01-09 MED ORDER — PHENOL 1.4 % MT LIQD: 1.0000 | OROMUCOSAL | Status: DC | PRN

## 2017-01-09 MED ORDER — DEXTROSE 5 % IV SOLN: 500.0000 mg | Freq: Four times a day (QID) | INTRAVENOUS | Status: DC | PRN

## 2017-01-09 MED ORDER — SODIUM CHLORIDE 0.9 % IJ SOLN
INTRAMUSCULAR | Status: DC | PRN
  Administered 2017-01-09: 50 mL

## 2017-01-09 MED ORDER — CELECOXIB 200 MG PO CAPS
200.0000 mg | ORAL_CAPSULE | Freq: Two times a day (BID) | ORAL | Status: DC
  Administered 2017-01-09 – 2017-01-13 (×8): 200 mg via ORAL
  Filled 2017-01-09 (×8): qty 1

## 2017-01-09 MED ORDER — SODIUM CHLORIDE 0.9 % IR SOLN
Status: DC | PRN
  Administered 2017-01-09: 3000 mL

## 2017-01-09 MED ORDER — TIZANIDINE HCL 2 MG PO TABS: 2.0000 mg | ORAL_TABLET | Freq: Four times a day (QID) | ORAL | 0 refills | Status: DC | PRN

## 2017-01-09 MED ORDER — BUPIVACAINE-EPINEPHRINE (PF) 0.25% -1:200000 IJ SOLN
INTRAMUSCULAR | Status: DC | PRN
  Administered 2017-01-09: 50 mL

## 2017-01-09 SURGICAL SUPPLY — 51 items
BANDAGE ESMARK 6X9 LF (GAUZE/BANDAGES/DRESSINGS) ×1 IMPLANT
BLADE SAG 18X100X1.27 (BLADE) ×3 IMPLANT
BLADE SAGITTAL 13X1.27X60 (BLADE) IMPLANT
BLADE SAGITTAL 13X1.27X60MM (BLADE)
BLADE SAW SGTL 13X75X1.27 (BLADE) IMPLANT
BNDG ELASTIC 6X10 VLCR STRL LF (GAUZE/BANDAGES/DRESSINGS) ×3 IMPLANT
BNDG ESMARK 6X9 LF (GAUZE/BANDAGES/DRESSINGS) ×3
BOWL SMART MIX CTS (DISPOSABLE) ×3 IMPLANT
CAPT KNEE TOTAL 3 ATTUNE ×3 IMPLANT
CEMENT HV SMART SET (Cement) ×6 IMPLANT
COVER SURGICAL LIGHT HANDLE (MISCELLANEOUS) ×3 IMPLANT
CUFF TOURNIQUET SINGLE 34IN LL (TOURNIQUET CUFF) ×3 IMPLANT
CUFF TOURNIQUET SINGLE 44IN (TOURNIQUET CUFF) IMPLANT
DRAPE EXTREMITY T 121X128X90 (DRAPE) ×3 IMPLANT
DRAPE U-SHAPE 47X51 STRL (DRAPES) ×3 IMPLANT
DRSG AQUACEL AG ADV 3.5X10 (GAUZE/BANDAGES/DRESSINGS) ×3 IMPLANT
DURAPREP 26ML APPLICATOR (WOUND CARE) ×3 IMPLANT
ELECT REM PT RETURN 9FT ADLT (ELECTROSURGICAL) ×3
ELECTRODE REM PT RTRN 9FT ADLT (ELECTROSURGICAL) ×1 IMPLANT
GLOVE BIO SURGEON STRL SZ7.5 (GLOVE) ×3 IMPLANT
GLOVE BIO SURGEON STRL SZ8.5 (GLOVE) ×3 IMPLANT
GLOVE BIOGEL PI IND STRL 8 (GLOVE) ×1 IMPLANT
GLOVE BIOGEL PI IND STRL 9 (GLOVE) ×1 IMPLANT
GLOVE BIOGEL PI INDICATOR 8 (GLOVE) ×2
GLOVE BIOGEL PI INDICATOR 9 (GLOVE) ×2
GOWN STRL REUS W/ TWL LRG LVL3 (GOWN DISPOSABLE) ×1 IMPLANT
GOWN STRL REUS W/ TWL XL LVL3 (GOWN DISPOSABLE) ×2 IMPLANT
GOWN STRL REUS W/TWL LRG LVL3 (GOWN DISPOSABLE) ×2
GOWN STRL REUS W/TWL XL LVL3 (GOWN DISPOSABLE) ×4
HANDPIECE INTERPULSE COAX TIP (DISPOSABLE) ×2
HOOD PEEL AWAY FACE SHEILD DIS (HOOD) ×6 IMPLANT
KIT BASIN OR (CUSTOM PROCEDURE TRAY) ×3 IMPLANT
KIT ROOM TURNOVER OR (KITS) ×3 IMPLANT
MANIFOLD NEPTUNE II (INSTRUMENTS) ×3 IMPLANT
NEEDLE 22X1 1/2 (OR ONLY) (NEEDLE) ×6 IMPLANT
NS IRRIG 1000ML POUR BTL (IV SOLUTION) ×3 IMPLANT
PACK TOTAL JOINT (CUSTOM PROCEDURE TRAY) ×3 IMPLANT
PAD ARMBOARD 7.5X6 YLW CONV (MISCELLANEOUS) ×6 IMPLANT
SET HNDPC FAN SPRY TIP SCT (DISPOSABLE) ×1 IMPLANT
SUT VIC AB 0 CT1 27 (SUTURE) ×2
SUT VIC AB 0 CT1 27XBRD ANBCTR (SUTURE) ×1 IMPLANT
SUT VIC AB 1 CTX 36 (SUTURE) ×2
SUT VIC AB 1 CTX36XBRD ANBCTR (SUTURE) ×1 IMPLANT
SUT VIC AB 2-0 CT1 27 (SUTURE) ×2
SUT VIC AB 2-0 CT1 TAPERPNT 27 (SUTURE) ×1 IMPLANT
SUT VIC AB 3-0 CT1 27 (SUTURE) ×2
SUT VIC AB 3-0 CT1 TAPERPNT 27 (SUTURE) ×1 IMPLANT
SYR CONTROL 10ML LL (SYRINGE) ×6 IMPLANT
TOWEL OR 17X24 6PK STRL BLUE (TOWEL DISPOSABLE) ×3 IMPLANT
TOWEL OR 17X26 10 PK STRL BLUE (TOWEL DISPOSABLE) ×3 IMPLANT
TRAY CATH 16FR W/PLASTIC CATH (SET/KITS/TRAYS/PACK) IMPLANT

## 2017-01-09 NOTE — Discharge Instructions (Signed)

## 2017-01-09 NOTE — Interval H&P Note (Signed)
History and Physical Interval Note:  01/09/2017 7:06 AM  Tonya Johns  has presented today for surgery, with the diagnosis of LEFT KNEE OSTEOARTHRITIS  The various methods of treatment have been discussed with the patient and family. After consideration of risks, benefits and other options for treatment, the patient has consented to  Procedure(s): TOTAL KNEE ARTHROPLASTY (Left) as a surgical intervention .  The patient's history has been reviewed, patient examined, no change in status, stable for surgery.  I have reviewed the patient's chart and labs.  Questions were answered to the patient's satisfaction.     Nestor LewandowskyFrank J Illyana Schorsch

## 2017-01-09 NOTE — Evaluation (Signed)
Physical Therapy Evaluation Patient Details Name: Tonya Johns MRN: [id number] DOB: [birth year] Today's Date: 01/09/2017   History of Present Illness  Pt is an 83 y/o female s/p elective L TKA. PMH includes RA, depression, DM, fibromyalgia, HTN, and back surgery.   Clinical Impression  Pt is s/p surgery above with deficits below. PTA, pt was independent with functional mobility. Upon eval, pt presenting with post op pain and weakness, decreased balance, and decreased sensation in groin area. Pt limited to stand pivot [facility] and then to recliner secondary to pain. Required min A for stand pivot with RW and required min to mod A to stand. Reports daughter will be able to assist as needed upon d/c. Will need RW for safety with mobility at home. Follow up reccomendations per MD arrangements. Will continue to follow acutely to maximize functional mobility independence and safety.     Follow Up Recommendations DC plan and follow up therapy as arranged by surgeon;Supervision for mobility/OOB    Equipment Recommendations  Rolling walker with 5" wheels    Recommendations for Other Services       Precautions / Restrictions Precautions Precautions: Knee Precaution Booklet Issued: Yes (comment) Precaution Comments: Reviewed supine ther ex with pt.  Restrictions Weight Bearing Restrictions: Yes LLE Weight Bearing: Weight bearing as tolerated      Mobility  Bed Mobility Overal bed mobility: Needs Assistance Bed Mobility: Supine to Sit     Supine to sit: Min assist     General bed mobility comments: Min A for LLE management. Use of bed rails and elevated HOB.   Transfers Overall transfer level: Needs assistance Equipment used: Rolling walker (2 wheeled) Transfers: Sit to/from [facility] CorporationStand;Stand Pivot Transfers Sit to Stand: Min assist;Mod assist Stand pivot transfers: Min assist       General transfer comment: Mod A initially to stand from bed, however, decreased assist to min A  for lift assist to [facility]. Min A for steadying assist to perform stand pivot to [facility] and then to recliner. Verbal cues for sequencing with RW.   Ambulation/Gait             General Gait Details: Able to take a few steps during stand pivot transfer, however, further distance limited secondary to pain and decreased steadiness.   Stairs            Wheelchair Mobility    Modified Rankin (Stroke Patients Only)       Balance Overall balance assessment: Needs assistance Sitting-balance support: No upper extremity supported;Feet supported Sitting balance-Leahy Scale: Good     Standing balance support: Bilateral upper extremity supported;During functional activity Standing balance-Leahy Scale: Poor Standing balance comment: Reliant on UE support                              Pertinent Vitals/Pain Pain Assessment: 0-10 Pain Score: 6  Pain Location: L knee  Pain Descriptors / Indicators: Aching;Operative site guarding Pain Intervention(s): Limited activity within patient's tolerance;Monitored during session;Repositioned    Home Living Family/patient expects to be discharged to:: Private residence Living Arrangements: Children Available Help at Discharge: Family;Available 24 hours/day Type of Home: House Home Access: Stairs to enter Entrance Stairs-Rails: Right(has rail but it is wobbly ) Secretary/administratorntrance Stairs-Number of Steps: 5 Home Layout: One level Home Equipment: Shower seat - built in;Bedside commode      Prior Function Level of Independence: Independent  Hand Dominance   Dominant Hand: Right    Extremity/Trunk Assessment   Upper Extremity Assessment Upper Extremity Assessment: Defer to OT evaluation    Lower Extremity Assessment Lower Extremity Assessment: LLE deficits/detail LLE Deficits / Details: Numbness at groin. Deficits consistent with post op pain and weakness. Able to perform ther ex below.     Cervical / Trunk  Assessment Cervical / Trunk Assessment: Kyphotic  Communication   Communication: No difficulties  Cognition Arousal/Alertness: Awake/alert Behavior During Therapy: WFL for tasks assessed/performed Overall Cognitive Status: Within Functional Limits for tasks assessed                                        General Comments General comments (skin integrity, edema, etc.): Pt's daughter and niece present during session.     Exercises Total Joint Exercises Ankle Circles/Pumps: AROM;Both;20 reps Quad Sets: AROM;Left;10 reps Hip ABduction/ADduction: AROM;Left;10 reps   Assessment/Plan    PT Assessment Patient needs continued PT services  PT Problem List Decreased strength;Decreased balance;Decreased activity tolerance;Decreased mobility;Decreased range of motion;Decreased knowledge of use of DME;Decreased knowledge of precautions;Pain       PT Treatment Interventions DME instruction;Stair training;Gait training;Functional mobility training;Therapeutic activities;Balance training;Therapeutic exercise;Neuromuscular re-education;Patient/family education    PT Goals (Current goals can be found in the Care Plan section)  Acute Rehab PT Goals Patient Stated Goal: to go home  PT Goal Formulation: With patient Time For Goal Achievement: 01/16/17 Potential to Achieve Goals: Good    Frequency 7X/week   Barriers to discharge        Co-evaluation               AM-PAC PT "6 Clicks" Daily Activity  Outcome Measure Difficulty turning over in bed (including adjusting bedclothes, sheets and blankets)?: A Little Difficulty moving from lying on back to sitting on the side of the bed? : Unable Difficulty sitting down on and standing up from a chair with arms (e.g., wheelchair, bedside commode, etc,.)?: Unable Help needed moving to and from a bed to chair (including a wheelchair)?: A Little Help needed walking in hospital room?: A Little Help needed climbing 3-5 steps with  a railing? : A Lot 6 Click Score: 13    End of Session Equipment Utilized During Treatment: Gait belt Activity Tolerance: Patient limited by pain Patient left: in chair;with call bell/phone within reach;with family/visitor present Nurse Communication: Mobility status PT Visit Diagnosis: Other abnormalities of gait and mobility (R26.89);Unsteadiness on feet (R26.81);Pain Pain - Right/Left: Left Pain - part of body: Knee    Time: [id number] PT Time Calculation (min) (ACUTE ONLY): 28 min   Charges:   PT Evaluation $PT Eval Low Complexity: 1 Low PT Treatments $Therapeutic Activity: 8-22 mins   PT G Codes:        Gladys DammeBrittany Assunta Pupo, PT, DPT  Acute Rehabilitation Services  Pager: [phone]   Lehman PromBrittany S Lilyahna Sirmon 01/09/2017, 3:57 PM

## 2017-01-09 NOTE — Anesthesia Procedure Notes (Signed)
Spinal  Patient location during procedure: OR Start time: 01/09/2017 7:35 AM Staffing Anesthesiologist: Sharee HolsterMassagee, Matin Mattioli, MD Preanesthetic Checklist Completed: patient identified, site marked, surgical consent, pre-op evaluation, timeout performed, IV checked, risks and benefits discussed and monitors and equipment checked Spinal Block Prep: ChloraPrep Patient monitoring: heart rate, continuous pulse ox and blood pressure Approach: midline Location: L3-4 Injection technique: single-shot Needle Needle gauge: 24 G Needle length: 9 cm Needle insertion depth: 4 cm Assessment Sensory level: T6

## 2017-01-09 NOTE — Transfer of Care (Signed)
Immediate Anesthesia Transfer of Care Note  Patient: Tonya Johns  Procedure(s) Performed: TOTAL KNEE ARTHROPLASTY (Left Knee)  Patient Location: PACU  Anesthesia Type:Spinal  Level of Consciousness: awake, alert , oriented and patient cooperative  Airway & Oxygen Therapy: Patient Spontanous Breathing and Patient connected to face mask oxygen  Post-op Assessment: Report given to RN and Post -op Vital signs reviewed and stable  Post vital signs: Reviewed and stable  Last Vitals:  Vitals:   01/09/17 0610 01/09/17 0625  BP: (!) 185/69 (!) 196/71  Pulse:    Resp:    Temp:    SpO2:      Last Pain:  Vitals:   01/09/17 0556  TempSrc: Oral  PainSc:          Complications: No apparent anesthesia complications

## 2017-01-09 NOTE — Anesthesia Postprocedure Evaluation (Signed)
Anesthesia Post Note  Patient: Tonya Johns  Procedure(s) Performed: TOTAL KNEE ARTHROPLASTY (Left Knee)     Patient location during evaluation: PACU Anesthesia Type: Spinal Level of consciousness: oriented and awake and alert Pain management: pain level controlled Vital Signs Assessment: post-procedure vital signs reviewed and stable Respiratory status: spontaneous breathing, respiratory function stable and patient connected to nasal cannula oxygen Cardiovascular status: blood pressure returned to baseline and stable Postop Assessment: no headache, no backache and no apparent nausea or vomiting Anesthetic complications: no    Last Vitals:  Vitals:   01/09/17 1135 01/09/17 1150  BP: (!) 189/68 (!) 144/76  Pulse: 63 61  Resp: 16 16  Temp:    SpO2: 99% 97%    Last Pain:  Vitals:   01/09/17 1150  TempSrc:   PainSc: 4                  Haygen Zebrowski,JAMES TERRILL

## 2017-01-09 NOTE — Anesthesia Procedure Notes (Addendum)
Anesthesia Regional Block: Adductor canal block   Pre-Anesthetic Checklist: ,, timeout performed, Correct Patient, Correct Site, Correct Laterality, Correct Procedure, Correct Position, site marked, Risks and benefits discussed,  Surgical consent,  Pre-op evaluation,  At surgeon's request and post-op pain management  Laterality: Left and Lower  Prep: chloraprep       Needles:       Needle Gauge: 21   Needle insertion depth: 6 cm   Additional Needles:   Procedures:,,,, ultrasound used (permanent image in chart),,,,  Narrative:  Start time: 01/09/2017 6:55 AM End time: 01/09/2017 7:09 AM Injection made incrementally with aspirations every 5 mL.  Performed by: Personally  Anesthesiologist: Sharee HolsterMassagee, Terry, MD

## 2017-01-09 NOTE — Op Note (Signed)
PATIENT ID:      Tonya Johns  MRN:     [id number] DOB/AGE:    Jun 20, 1930 / 83 y.o.       OPERATIVE REPORT    DATE OF PROCEDURE:  01/09/2017       PREOPERATIVE DIAGNOSIS:   LEFT KNEE OSTEOARTHRITIS      Estimated body mass index is 31.58 kg/m as calculated from the following:   Height as of 01/05/17: 5\' 4"  (1.626 m).   Weight as of 01/05/17: 184 lb (83.5 kg).                                                        POSTOPERATIVE DIAGNOSIS:   LEFT KNEE OSTEOARTHRITIS                                                                      PROCEDURE:  Procedure(s): TOTAL KNEE ARTHROPLASTY Using DepuyAttune RP implants #6L Femur, #6Tibia, 5 mm Attune RP bearing, 38 Patella     SURGEON: Nestor LewandowskyFrank J Hardie Veltre    ASSISTANT:   Tomi LikensEric K. [employer] Energy[employer]illips PA-C   (Present and scrubbed throughout the case, critical for assistance with exposure, retraction, instrumentation, and closure.)         ANESTHESIA: Spinal, 20cc Exparel, 50cc 0.25% Marcaine  EBL: 300cc  FLUID REPLACEMENT: 1500cc crystalloid  TOURNIQUET TIME: 15min  Drains: None  Tranexamic Acid: 1gm IV, 2gm topical  COMPLICATIONS:  None         INDICATIONS FOR PROCEDURE: The patient has  LEFT KNEE OSTEOARTHRITIS, Var deformities, XR shows bone on bone arthritis, lateral subluxation of tibia. Patient has failed all conservative measures including anti-inflammatory medicines, narcotics, attempts at  exercise and weight loss, cortisone injections and viscosupplementation.  Risks and benefits of surgery have been discussed, questions answered.   DESCRIPTION OF PROCEDURE: The patient identified by armband, received  IV antibiotics[facility] Patient taken to the operating room, appropriate anesthetic  monitors were attached, and Spi9nal anesthesia was  induced. Tourniquet  applied high to the operative thigh. Lateral post and foot positioner  applied to the table, the lower extremity was then prepped and draped  in usual sterile  fashion from the toes to the tourniquet. Time-out procedure was performed. We began the operation, with the knee flexed 120 degrees, by making the anterior midline incision starting at handbreadth above the patella going over the patella 1 cm medial to and 4 cm distal to the tibial tubercle. Small bleeders in the skin and the  subcutaneous tissue identified and cauterized. Transverse retinaculum was incised and reflected medially and a medial parapatellar arthrotomy was accomplished. the patella was everted and theprepatellar fat pad resected. The superficial medial collateral  ligament was then elevated from anterior to posterior along the proximal  flare of the tibia and anterior half of the menisci resected. The knee was hyperflexed exposing bone on bone arthritis. Peripheral and notch osteophytes as well as the cruciate ligaments were then resected. We continued to  work our way around posteriorly along the proximal tibia, and externally  rotated the tibia subluxing it out from underneath the femur. A McHale  retractor was placed through the notch and a lateral Hohmann retractor  placed, and we then drilled through the proximal tibia in line with the  axis of the tibia followed by an intramedullary guide rod and 2-degree  posterior slope cutting guide. The tibial cutting guide, 3 degree posterior sloped, was pinned into place allowing resection of 0 mm of bone medially and 10 mm of bone laterally. Satisfied with the tibial resection, we then  entered the distal femur 2 mm anterior to the PCL origin with the  intramedullary guide rod and applied the distal femoral cutting guide  set at 9 mm, with 5 degrees of valgus. This was pinned along the  epicondylar axis. At this point, the distal femoral cut was accomplished without difficulty. We then sized for a #6L femoral component and pinned the guide in 3 degrees of external rotation. The chamfer cutting guide was pinned into place. The anterior,  posterior, and chamfer cuts were accomplished without difficulty followed by  the Attune RP box cutting guide and the box cut. We also removed posterior osteophytes from the posterior femoral condyles. At this  time, the knee was brought into full extension. We checked our  extension and flexion gaps and found them symmetric for a 5 mm bearing. Distracting in extension with a lamina spreader, the posterior horns of the menisci were removed, and Exparel, diluted to 60 cc, with 20cc NS, and 20cc 0.5% Marcaine,was injected into the capsule and synovium of the knee. The posterior patella cut was accomplished with the 9.5 mm Attune cutting guide, sized for a 38mm dome, and the fixation pegs drilled.The knee  was then once again hyperflexed exposing the proximal tibia. We sized for a # 6 tibial base plate, applied the smokestack and the conical reamer followed by the the Delta fin keel punch. We then hammered into place the Attune RP trial femoral component, drilled the lugs, inserted a  5 mm trial bearing, trial patellar button, and took the knee through range of motion from 0-130 degrees. No thumb pressure was required for patellar Tracking. At this point, the limb was wrapped with an Esmarch bandage and the tourniquet inflated to 350 mmHg. All trial components were removed, mating surfaces irrigated with pulse lavage, and dried with suction and sponges. 10 cc of the Exparel solution was applied to the cancellus bone of the patella distal femur and proximal tibia.  After waiting 1 minute, the bony surfaces were again, dried with sponges. A double batch of DePuy HV cement with 1500 mg of Zinacef was mixed and applied to all bony metallic mating surfaces except for the posterior condyles of the femur itself. In order, we hammered into place the tibial tray and removed excess cement, the femoral component and removed excess cement. The final Attune RP bearing  was inserted, and the knee brought to full extension with  compression.  The patellar button was clamped into place, and excess cement  removed. While the cement cured the wound was irrigated out with normal saline solution pulse lavage. Ligament stability and patellar tracking were checked and found to be excellent. The parapatellar arthrotomy was closed with  running #1 Vicryl suture. The subcutaneous tissue with 0 and 2-0 undyed  Vicryl suture, and the skin with running 3-0 SQ vicryl. A dressing of Xeroform,  4 x 4, dressing sponges, Webril, and Ace wrap applied. The patient  awakened, and taken to recovery room  without difficulty.   Nestor LewandowskyFrank J Kaeden Depaz 01/09/2017, 9:06 AM

## 2017-01-10 ENCOUNTER — Encounter (HOSPITAL_COMMUNITY): Payer: Self-pay | Admitting: Orthopedic Surgery

## 2017-01-10 LAB — BASIC METABOLIC PANEL
Anion gap: 8 (ref 5–15)
BUN: 14 mg/dL (ref 6–20)
CALCIUM: 8.6 mg/dL — AB (ref 8.9–10.3)
CHLORIDE: 100 mmol/L — AB (ref 101–111)
CO2: 23 mmol/L (ref 22–32)
Creatinine, Ser: 1.17 mg/dL — ABNORMAL HIGH (ref 0.44–1.00)
GFR, EST AFRICAN AMERICAN: 48 mL/min — AB (ref >60)
GFR, EST NON AFRICAN AMERICAN: 42 mL/min — AB (ref >60)
Glucose, Bld: 175 mg/dL — ABNORMAL HIGH (ref 65–99)
Potassium: 4.4 mmol/L (ref 3.5–5.1)
SODIUM: 131 mmol/L — AB (ref 135–145)

## 2017-01-10 LAB — CBC
HCT: 27.3 % — ABNORMAL LOW (ref 36.0–46.0)
HEMOGLOBIN: 9.1 g/dL — AB (ref 12.0–15.0)
MCH: 29.9 pg (ref 26.0–34.0)
MCHC: 33.3 g/dL (ref 30.0–36.0)
MCV: 89.8 fL (ref 78.0–100.0)
PLATELETS: 195 10*3/uL (ref 150–400)
RBC: 3.04 MIL/uL — ABNORMAL LOW (ref 3.87–5.11)
RDW: 13.6 % (ref 11.5–15.5)
WBC: 12.2 10*3/uL — ABNORMAL HIGH (ref 4.0–10.5)

## 2017-01-10 NOTE — Evaluation (Signed)
Occupational Therapy Evaluation Patient Details Name: Tonya Johns Scantlin MRN: [id number] DOB: [birth year] Today's Date: 01/10/2017    History of Present Illness Pt is an 83 y/o female s/p elective L TKA. PMH includes RA, depression, DM, fibromyalgia, HTN, and back surgery.    Clinical Impression   Patient is s/p L TKA surgery resulting in functional limitations due to the deficits listed below (see OT problem list). PTA was independent with all adls and iadls. Pt helps cook and clean in the home for son. Pt reports needing any additional assistance available by an aide.  Patient will benefit from skilled OT acutely to increase independence and safety with ADLS to allow discharge hhot/3n1/rw.     Follow Up Recommendations  Home health OT    Equipment Recommendations  3 in 1 bedside commode;Other (comment)(RW)    Recommendations for Other Services Other (comment)(AIDE)     Precautions / Restrictions Precautions Precautions: Knee Precaution Booklet Issued: Yes (comment) Precaution Comments: educated on use of foam for 30 minutes minimum to help with knee extension / ice application Restrictions Weight Bearing Restrictions: No LLE Weight Bearing: Weight bearing as tolerated      Mobility Bed Mobility Overal bed mobility: Modified Independent Bed Mobility: Supine to Sit           General bed mobility comments: bed elevated and incr time - enter on L side  Transfers Overall transfer level: Needs assistance Equipment used: Rolling walker (2 wheeled) Transfers: Sit to/from Stand Sit to Stand: Min guard         General transfer comment: min guard for safety; cues for safe hand placement    Balance Overall balance assessment: Needs assistance Sitting-balance support: No upper extremity supported;Feet supported Sitting balance-Leahy Scale: Good     Standing balance support: Bilateral upper extremity supported;During functional activity Standing balance-Leahy Scale:  Poor Standing balance comment: Reliant on UE support                            ADL either performed or assessed with clinical judgement   ADL Overall ADL's : Needs assistance/impaired Eating/Feeding: Independent   Grooming: Wash/dry hands;Modified independent   Upper Body Bathing: Modified independent;Sitting   Lower Body Bathing: Moderate assistance           Toilet Transfer: Min guard;Ambulation;RW;BSC Toilet Transfer Details (indicate cue type and reason): cues for hand placement       Tub/Shower Transfer Details (indicate cue type and reason): will need education Functional mobility during ADLs: Min guard;Rolling walker General ADL Comments: pt in chair on arrival and demonstrates bathroom transfer and ambulation the length of the home ( bedroom to kitchen) pt educated on posture/ balance/ use of DME/ locations in the home to rest to decr fall risk. pt with incr time and > 4 steps to complete a turn with RW. pt high fall risk. pt completed bed mobility with incr time and bed elevated to simulate home     Vision Baseline Vision/History: No visual deficits Additional Comments: no     Perception     Praxis      Pertinent Vitals/Pain Pain Assessment: Faces Faces Pain Scale: Hurts a little bit Pain Location: L knee  Pain Descriptors / Indicators: Aching;Guarding;Grimacing Pain Intervention(s): Monitored during session;Premedicated before session;Repositioned;Ice applied     Hand Dominance Right   Extremity/Trunk Assessment Upper Extremity Assessment Upper Extremity Assessment: Overall WFL for tasks assessed   Lower Extremity Assessment Lower Extremity Assessment: Defer to  PT evaluation   Cervical / Trunk Assessment Cervical / Trunk Assessment: Kyphotic   Communication Communication Communication: No difficulties   Cognition Arousal/Alertness: Awake/alert Behavior During Therapy: Flat affect Overall Cognitive Status: Within Functional Limits for  tasks assessed                                     General Comments  educated on dressing and and bathing --- keeping knee incision dry     Exercises Exercises: Total Joint Total Joint Exercises Ankle Circles/Pumps: AROM;Both;10 reps Quad Sets: AROM;Left;10 reps Short Arc Quad: AROM;Left;10 reps Heel Slides: AAROM;Left;10 reps Hip ABduction/ADduction: AROM;Left;10 reps Straight Leg Raises: AAROM;Left;10 reps Long Arc Quad: AROM;Left;10 reps;Seated Knee Flexion: AROM;Left;5 reps;Seated;Other (comment)(10 sec holds)   Shoulder Instructions      Home Living Family/patient expects to be discharged to:: Private residence Living Arrangements: Children Available Help at Discharge: Family;Available 24 hours/day Type of Home: House Home Access: Stairs to enter [employer] of Steps: 5 Entrance Stairs-Rails: Right Home Layout: One level     Bathroom Shower/Tub: Producer, television/film/video: Standard     Home Equipment: Shower seat - built in;Bedside commode   Additional Comments: pt does all cleaning and cooking int he home. Son lives with patient but has hx of "diability" which after further questioning is back related in nature. Daughter is willing to help in any way possible however she lives in a different home and will not be staying with patient.       Prior Functioning/Environment Level of Independence: Independent                 OT Problem List: Decreased strength;Impaired balance (sitting and/or standing);Decreased activity tolerance;Decreased safety awareness;Decreased knowledge of use of DME or AE;Decreased knowledge of precautions;Pain      OT Treatment/Interventions: Self-care/ADL training;Therapeutic exercise;DME and/or AE instruction;Therapeutic activities;Patient/family education;Balance training    OT Goals(Current goals can be found in the care plan section) Acute Rehab OT Goals Patient Stated Goal: to go home  OT Goal  Formulation: With patient Time For Goal Achievement: 01/24/17 Potential to Achieve Goals: Good  OT Frequency: Min 2X/week   Barriers to D/C: Decreased caregiver support(requesting an Aide)          Co-evaluation              AM-PAC PT "6 Clicks" Daily Activity     Outcome Measure Help from another person eating meals?: None Help from another person taking care of personal grooming?: None Help from another person toileting, which includes using toliet, bedpan, or urinal?: A Lot Help from another person bathing (including washing, rinsing, drying)?: A Little Help from another person to put on and taking off regular upper body clothing?: A Little Help from another person to put on and taking off regular lower body clothing?: A Lot 6 Click Score: 18   End of Session Equipment Utilized During Treatment: Gait belt;Rolling walker Nurse Communication: Mobility status;Precautions  Activity Tolerance: Patient tolerated treatment well Patient left: in bed;with call bell/phone within reach;with family/visitor present  OT Visit Diagnosis: Unsteadiness on feet (R26.81);Muscle weakness (generalized) (M62.81)                Time: 1610-9604 OT Time Calculation (min): 30 min Charges:  OT General Charges $OT Visit: 1 Visit OT Evaluation $OT Eval Moderate Complexity: 1 Mod OT Treatments $Self Care/Home Management : 8-22 mins G-Codes:  Mateo FlowJones, Brynn   OTR/L Pager: [phone] Office: [phone] .   Boone MasterJones, Raelynne Ludwick B 01/10/2017, 4:07 PM

## 2017-01-10 NOTE — Progress Notes (Signed)
Physical Therapy Treatment Patient Details Name: Tonya Johns MRN: [id number] DOB: 1930-06-01 Today's Date: 01/10/2017    History of Present Illness Pt is an 83 y/o female s/p elective L TKA. PMH includes RA, depression, DM, fibromyalgia, HTN, and back surgery.     PT Comments    Patient is making progress toward mobility goals. Pt limited by lethargy (suspect due to medication). Continue to progress as tolerated.    Follow Up Recommendations  DC plan and follow up therapy as arranged by surgeon;Supervision for mobility/OOB     Equipment Recommendations  Rolling walker with 5" wheels    Recommendations for Other Services       Precautions / Restrictions Precautions Precautions: Knee Precaution Booklet Issued: Yes (comment) Precaution Comments: positioning/precautions reviewed with pt and daughter Restrictions Weight Bearing Restrictions: Yes LLE Weight Bearing: Weight bearing as tolerated    Mobility  Bed Mobility Overal bed mobility: Modified Independent Bed Mobility: Supine to Sit           General bed mobility comments: increased time and effort needed  Transfers Overall transfer level: Needs assistance Equipment used: Rolling walker (2 wheeled) Transfers: Sit to/from [employer]Stand;Stand Pivot Transfers Sit to Stand: Min assist         General transfer comment: assist to power up and to steady; cues for safe hand placement  Ambulation/Gait Ambulation/Gait assistance: Min assist;+2 safety/equipment(chair follow) Ambulation Distance (Feet): (5230ft X2) Assistive device: Rolling walker (2 wheeled) Gait Pattern/deviations: Step-through pattern;Decreased step length - right;Decreased stance time - left;Decreased weight shift to left;Antalgic;Trunk flexed Gait velocity: decreased   General Gait Details: cues for sequencing, proximity of RW, and posture; one seated rest break due to c/o dizziness   Stairs            Wheelchair Mobility    Modified Rankin  (Stroke Patients Only)       Balance Overall balance assessment: Needs assistance Sitting-balance support: No upper extremity supported;Feet supported Sitting balance-Leahy Scale: Good     Standing balance support: Bilateral upper extremity supported;During functional activity Standing balance-Leahy Scale: Poor Standing balance comment: Reliant on UE support                             Cognition Arousal/Alertness: Lethargic;Suspect due to medications Behavior During Therapy: Flat affect Overall Cognitive Status: Within Functional Limits for tasks assessed                                        Exercises Total Joint Exercises Long Arc Quad: AROM;Left;10 reps;Seated    General Comments General comments (skin integrity, edema, etc.): daughter present      Pertinent Vitals/Pain Pain Assessment: 0-10 Pain Score: 5  Pain Location: L knee  Pain Descriptors / Indicators: Aching;Grimacing;Guarding Pain Intervention(s): Limited activity within patient's tolerance;Monitored during session;Premedicated before session;Repositioned    Home Living                      Prior Function            PT Goals (current goals can now be found in the care plan section) Acute Rehab PT Goals Patient Stated Goal: to go home  PT Goal Formulation: With patient Time For Goal Achievement: 01/16/17 Potential to Achieve Goals: Good Progress towards PT goals: Progressing toward goals    Frequency    7X/week  PT Plan Current plan remains appropriate    Co-evaluation              AM-PAC PT "6 Clicks" Daily Activity  Outcome Measure  Difficulty turning over in bed (including adjusting bedclothes, sheets and blankets)?: A Little Difficulty moving from lying on back to sitting on the side of the bed? : A Lot Difficulty sitting down on and standing up from a chair with arms (e.g., wheelchair, bedside commode, etc,.)?: Unable Help needed moving  to and from a bed to chair (including a wheelchair)?: A Little Help needed walking in hospital room?: A Little Help needed climbing 3-5 steps with a railing? : A Little 6 Click Score: 15    End of Session Equipment Utilized During Treatment: Gait belt Activity Tolerance: Patient limited by lethargy Patient left: in chair;with call bell/phone within reach;with family/visitor present Nurse Communication: Mobility status PT Visit Diagnosis: Other abnormalities of gait and mobility (R26.89);Unsteadiness on feet (R26.81);Pain Pain - Right/Left: Left Pain - part of body: Knee     Time: [id number] PT Time Calculation (min) (ACUTE ONLY): 32 min  Charges:  $Gait Training: 23-37 mins                    G Codes:       Erline LevineKellyn Lakyla Biswas, PTA Pager: [phone]) [phone]     Carolynne EdouardKellyn R Brealynn Contino 01/10/2017, 10:31 AM

## 2017-01-10 NOTE — Progress Notes (Signed)
PATIENT ID: Tonya Johns  MRN: [id number]  DOB/AGE:  81-Apr-1932 / 81 y.o.  1 Day Post-Op Procedure(s) (LRB): TOTAL KNEE ARTHROPLASTY (Left)    PROGRESS NOTE Subjective: Patient is alert, oriented, x1 Nausea, no Vomiting, yes passing gas. Taking PO well. Denies SOB, Chest or Calf Pain. Using Incentive Spirometer, PAS in place. Ambulate WBAT in room, Patient reports pain as 4/10 .    Objective: Vital signs in last 24 hours: Vitals:   01/09/17 1430 01/09/17 1900 01/09/17 2312 01/10/17 0615  BP: (!) 170/76 (!) 167/77 (!) 150/73 (!) 156/69  Pulse: 74 76 86 69  Resp: 14 14 15 14   Temp: (!) 97.5 F (36.4 C) (!) 97.5 F (36.4 C) 98.1 F (36.7 C) 97.9 F (36.6 C)  TempSrc: Axillary Axillary Oral Oral  SpO2: 99% 94% 98% 97%  Weight:      Height:          Intake/Output from previous day: I/O last 3 completed shifts: In: 2381.7 [P.O.:480; I.V.:1641.7; Other:150; IV Piggyback:110] Out: 1000 [Urine:1000]   Intake/Output this shift: No intake/output data recorded.   LABORATORY DATA: Recent Labs    01/10/17 0411  WBC 12.2*  HGB 9.1*  HCT 27.3*  PLT 195  NA 131*  K 4.4  CL 100*  CO2 23  BUN 14  CREATININE 1.17*  GLUCOSE 175*  CALCIUM 8.6*    Examination: Neurologically intact ABD soft Neurovascular intact Sensation intact distally Intact pulses distally Dorsiflexion/Plantar flexion intact Incision: dressing C/D/I No cellulitis present Compartment soft}  Assessment:   1 Day Post-Op Procedure(s) (LRB): TOTAL KNEE ARTHROPLASTY (Left) ADDITIONAL DIAGNOSIS: Expected Acute Blood Loss Anemia, GERD, anemia pre-op chronic  Plan: PT/OT WBAT, AROM and PROM  DVT Prophylaxis:  SCDx72hrs, ASA 325 mg BID x 2 weeks DISCHARGE PLAN: Home DISCHARGE NEEDS: HHPT, Walker and 3-in-1 comode seat     Nestor LewandowskyFrank J Offie Waide 01/10/2017, 8:37 AM

## 2017-01-10 NOTE — Progress Notes (Signed)
Physical Therapy Treatment Patient Details Name: Tonya Johns MRN: [id number] DOB: 1931-01-30 Today's Date: 01/10/2017    History of Present Illness Pt is an 83 y/o female s/p elective L TKA. PMH includes RA, depression, DM, fibromyalgia, HTN, and back surgery.     PT Comments    Patient was able to complete HEP with min A at times to achieve available ROM. Pt tolerated short distance gait this session with min A. Pt continues to be drowsy but willing to participate. Continue to progress as tolerated.    Follow Up Recommendations  DC plan and follow up therapy as arranged by surgeon;Supervision for mobility/OOB     Equipment Recommendations  Rolling walker with 5" wheels    Recommendations for Other Services       Precautions / Restrictions Precautions Precautions: Knee Precaution Booklet Issued: Yes (comment) Precaution Comments: positioning/precautions reviewed with pt and daughter Restrictions Weight Bearing Restrictions: Yes LLE Weight Bearing: Weight bearing as tolerated    Mobility  Bed Mobility Overal bed mobility: Modified Independent Bed Mobility: Supine to Sit           General bed mobility comments: increased time and effort needed  Transfers Overall transfer level: Needs assistance Equipment used: Rolling walker (2 wheeled) Transfers: Sit to/from Stand Sit to Stand: Min guard         General transfer comment: min guard for safety; cues for safe hand placement  Ambulation/Gait Ambulation/Gait assistance: Min assist Ambulation Distance (Feet): (5212ft X2) Assistive device: Rolling walker (2 wheeled) Gait Pattern/deviations: Step-through pattern;Decreased step length - right;Decreased stance time - left;Decreased weight shift to left;Antalgic;Trunk flexed Gait velocity: decreased   General Gait Details: cues for posture and proximity to [employer]   Stairs            Wheelchair Mobility    Modified Rankin (Stroke Patients Only)       Balance  Overall balance assessment: Needs assistance Sitting-balance support: No upper extremity supported;Feet supported Sitting balance-Leahy Scale: Good     Standing balance support: Bilateral upper extremity supported;During functional activity Standing balance-Leahy Scale: Poor Standing balance comment: Reliant on UE support                             Cognition Arousal/Alertness: Suspect due to medications(drowsy throughout session) Behavior During Therapy: Flat affect Overall Cognitive Status: Within Functional Limits for tasks assessed                                        Exercises Total Joint Exercises Ankle Circles/Pumps: AROM;Both;10 reps Quad Sets: AROM;Left;10 reps Short Arc Quad: AROM;Left;10 reps Heel Slides: AAROM;Left;10 reps Hip ABduction/ADduction: AROM;Left;10 reps Straight Leg Raises: AAROM;Left;10 reps Long Arc Quad: AROM;Left;10 reps;Seated Knee Flexion: AROM;Left;5 reps;Seated;Other (comment)(10 sec holds)    General Comments General comments (skin integrity, edema, etc.): daughter present      Pertinent Vitals/Pain Pain Assessment: Faces Faces Pain Scale: Hurts little more Pain Location: L knee  Pain Descriptors / Indicators: Aching;Guarding;Grimacing Pain Intervention(s): Limited activity within patient's tolerance;Monitored during session;Premedicated before session;Repositioned;Ice applied    Home Living                      Prior Function            PT Goals (current goals can now be found in the care plan section) Acute Rehab  PT Goals Patient Stated Goal: to go home  PT Goal Formulation: With patient Time For Goal Achievement: 01/16/17 Potential to Achieve Goals: Good Progress towards PT goals: Progressing toward goals    Frequency    7X/week      PT Plan Current plan remains appropriate    Co-evaluation              AM-PAC PT "6 Clicks" Daily Activity  Outcome Measure  Difficulty  turning over in bed (including adjusting bedclothes, sheets and blankets)?: A Little Difficulty moving from lying on back to sitting on the side of the bed? : A Lot Difficulty sitting down on and standing up from a chair with arms (e.g., wheelchair, bedside commode, etc,.)?: Unable Help needed moving to and from a bed to chair (including a wheelchair)?: A Little Help needed walking in hospital room?: A Little Help needed climbing 3-5 steps with a railing? : A Little 6 Click Score: 15    End of Session Equipment Utilized During Treatment: Gait belt Activity Tolerance: Patient tolerated treatment well Patient left: with call bell/phone within reach;with family/visitor present;in bed Nurse Communication: Mobility status PT Visit Diagnosis: Other abnormalities of gait and mobility (R26.89);Unsteadiness on feet (R26.81);Pain Pain - Right/Left: Left Pain - part of body: Knee     Time: 1510-1538 PT Time Calculation (min) (ACUTE ONLY): 28 min  Charges:  $Gait Training: 8-22 mins $Therapeutic Exercise: 8-22 mins                    G Codes:       Erline LevineKellyn Tashayla Therien, PTA Pager: [phone]     Carolynne EdouardKellyn R Roshelle Traub 01/10/2017, 3:46 PM

## 2017-01-11 LAB — CBC
HCT: 26.1 % — ABNORMAL LOW (ref 36.0–46.0)
HEMOGLOBIN: 8.7 g/dL — AB (ref 12.0–15.0)
MCH: 30.1 pg (ref 26.0–34.0)
MCHC: 33.3 g/dL (ref 30.0–36.0)
MCV: 90.3 fL (ref 78.0–100.0)
Platelets: 179 10*3/uL (ref 150–400)
RBC: 2.89 MIL/uL — AB (ref 3.87–5.11)
RDW: 13.5 % (ref 11.5–15.5)
WBC: 10.7 10*3/uL — ABNORMAL HIGH (ref 4.0–10.5)

## 2017-01-11 NOTE — Progress Notes (Signed)
Physical Therapy Treatment Patient Details Name: Tonya Johns MRN: [id number] DOB: [birth year] Today's Date: 01/11/2017    History of Present Illness Pt is an 83 y/o female s/p elective L TKA. PMH includes RA, depression, DM, fibromyalgia, HTN, and back surgery.     PT Comments    This session focused on stair training as pt was not safe with ascending/descending stairs this am. Pt was unable to ascend more than one step with mod A due to bilat knee pain and fatigue. Pt was more unsteady and leaning on RW when ambulating short distance. Pt's daughter expressed concern that she could not provide level of assist pt needs at this time. CM spoke to MD and d/c arrangements as planned. PT will continue to follow acutely and progress as tolerated.   Follow Up Recommendations  DC plan and follow up therapy as arranged by surgeon;Supervision/Assistance - 24 hour     Equipment Recommendations  Rolling walker with 5" wheels    Recommendations for Other Services       Precautions / Restrictions Precautions Precautions: Knee Precaution Booklet Issued: Yes (comment) Precaution Comments: positioning reviewed with pt Restrictions Weight Bearing Restrictions: Yes LLE Weight Bearing: Weight bearing as tolerated    Mobility  Bed Mobility Overal bed mobility: Independent             General bed mobility comments: pt OOB in chair upon arrival  Transfers Overall transfer level: Needs assistance Equipment used: Rolling walker (2 wheeled) Transfers: Sit to/from Stand Sit to Stand: Min assist         General transfer comment: assist to steady upon standing; cues for safe hand placement  Ambulation/Gait Ambulation/Gait assistance: Min assist Ambulation Distance (Feet): 4 Feet Assistive device: Rolling walker (2 wheeled) Gait Pattern/deviations: Decreased step length - right;Decreased stance time - left;Decreased weight shift to left;Antalgic;Trunk flexed;Step-to pattern Gait velocity:  decreased   General Gait Details: cues for posture and safe use of AD   Stairs       Number of Stairs: (1 step X2) General stair comments: stairs practiced again this session; attempted backwards with RW but pt unable to ascend due to painful R knee; attempted ascending with R hand rail to simulate home entrance and pt unable to step up without mod A; second step not attempted as pt was leaning on rail and very fatigued  Wheelchair Mobility    Modified Rankin (Stroke Patients Only)       Balance Overall balance assessment: Needs assistance Sitting-balance support: No upper extremity supported;Feet supported Sitting balance-Leahy Scale: Good     Standing balance support: Bilateral upper extremity supported;During functional activity Standing balance-Leahy Scale: Poor Standing balance comment: Reliant on UE support                             Cognition Arousal/Alertness: Awake/alert Behavior During Therapy: WFL for tasks assessed/performed Overall Cognitive Status: Within Functional Limits for tasks assessed                                        Exercises      General Comments General comments (skin integrity, edema, etc.): daughter present throughout session      Pertinent Vitals/Pain Pain Assessment: Faces Faces Pain Scale: Hurts even more Pain Location: L knee  Pain Descriptors / Indicators: Guarding;Sore;Aching Pain Intervention(s): Limited activity within patient's tolerance;Monitored during session;Repositioned  Home Living                      Prior Function            PT Goals (current goals can now be found in the care plan section) Acute Rehab PT Goals Patient Stated Goal: to go home  PT Goal Formulation: With patient Time For Goal Achievement: 01/16/17 Potential to Achieve Goals: Good Progress towards PT goals: Progressing toward goals    Frequency    7X/week      PT Plan Current plan remains  appropriate    Co-evaluation              AM-PAC PT "6 Clicks" Daily Activity  Outcome Measure  Difficulty turning over in bed (including adjusting bedclothes, sheets and blankets)?: A Little Difficulty moving from lying on back to sitting on the side of the bed? : A Little Difficulty sitting down on and standing up from a chair with arms (e.g., wheelchair, bedside commode, etc,.)?: Unable Help needed moving to and from a bed to chair (including a wheelchair)?: A Little Help needed walking in hospital room?: A Lot Help needed climbing 3-5 steps with a railing? : Total 6 Click Score: 13    End of Session Equipment Utilized During Treatment: Gait belt Activity Tolerance: Patient tolerated treatment well Patient left: with call bell/phone within reach;with family/visitor present;in bed Nurse Communication: Mobility status PT Visit Diagnosis: Other abnormalities of gait and mobility (R26.89);Unsteadiness on feet (R26.81);Pain Pain - Right/Left: Left Pain - part of body: Knee     Time: [id number] PT Time Calculation (min) (ACUTE ONLY): 22 min  Charges:  $Gait Training: 8-22 mins                    G Codes:       Erline LevineKellyn Vermelle Cammarata, PTA Pager: [phone]     Carolynne EdouardKellyn R Laker Thompson 01/11/2017, 3:40 PM

## 2017-01-11 NOTE — Progress Notes (Signed)
PATIENT ID: Tonya Johns  MRN: [id number]  DOB/AGE:  04/07/1930 / 83 y.o.  2 Days Post-Op Procedure(s) (LRB): TOTAL KNEE ARTHROPLASTY (Left)    PROGRESS NOTE Subjective: Patient is alert, oriented, no Nausea, no Vomiting, yes passing gas. Taking PO well. Denies SOB, Chest or Calf Pain. Using Incentive Spirometer, PAS in place. Ambulate WBAT with pt walking 30 ft x 2 with therapy, Patient reports pain as 5/10 .    Objective: Vital signs in last 24 hours: Vitals:   01/10/17 0615 01/10/17 1300 01/10/17 1900 01/11/17 0447  BP: (!) 156/69 (!) 160/80 (!) 160/67 136/62  Pulse: 69 85 71 70  Resp: 14 15 14 14   Temp: 97.9 F (36.6 C) 98 F (36.7 C) 98.8 F (37.1 C) 97.6 F (36.4 C)  TempSrc: Oral Oral Oral Oral  SpO2: 97% 97% 99% 96%  Weight:      Height:          Intake/Output from previous day: No intake/output data recorded.   Intake/Output this shift: No intake/output data recorded.   LABORATORY DATA: Recent Labs    01/10/17 0411 01/11/17 0535  WBC 12.2* 10.7*  HGB 9.1* 8.7*  HCT 27.3* 26.1*  PLT 195 179  NA 131*  --   K 4.4  --   CL 100*  --   CO2 23  --   BUN 14  --   CREATININE 1.17*  --   GLUCOSE 175*  --   CALCIUM 8.6*  --     Examination: Neurologically intact Neurovascular intact Sensation intact distally Intact pulses distally Dorsiflexion/Plantar flexion intact Incision: dressing C/D/I No cellulitis present Compartment soft}  Assessment:   2 Days Post-Op Procedure(s) (LRB): TOTAL KNEE ARTHROPLASTY (Left) ADDITIONAL DIAGNOSIS: Expected Acute Blood Loss Anemia, GERD, anemia pre-op chronic  Plan: PT/OT WBAT, AROM and PROM  DVT Prophylaxis:  SCDx72hrs, ASA 325 mg BID x 2 weeks DISCHARGE PLAN: Home DISCHARGE NEEDS: HHPT, Walker and 3-in-1 comode seat     [name] R 01/11/2017, 7:22 AM

## 2017-01-11 NOTE — Progress Notes (Signed)
Physical Therapy Treatment Patient Details Name: Tonya Johns MRN: [id number] DOB: May 24, 1930 Today's Date: 01/11/2017    History of Present Illness Pt is an 83 y/o female s/p elective L TKA. PMH includes RA, depression, DM, fibromyalgia, HTN, and back surgery.     PT Comments    Patient is making progress toward mobility goals. Pt tolerated increased gait distance and with improved step through pattern. Stairs practiced this session and will need to be practiced second time this pm prior to d/c home. Daughter present throughout session. Current plan remains appropriate.    Follow Up Recommendations  DC plan and follow up therapy as arranged by surgeon;Supervision for mobility/OOB     Equipment Recommendations  Rolling walker with 5" wheels    Recommendations for Other Services       Precautions / Restrictions Precautions Precautions: Knee Precaution Booklet Issued: Yes (comment) Precaution Comments: positioning reviewed with pt Restrictions Weight Bearing Restrictions: Yes LLE Weight Bearing: Weight bearing as tolerated    Mobility  Bed Mobility Overal bed mobility: Modified Independent Bed Mobility: Sit to Supine       Sit to supine: Min guard   General bed mobility comments: min guard for safety; no physical assist needed  Transfers Overall transfer level: Needs assistance Equipment used: Rolling walker (2 wheeled) Transfers: Sit to/from Stand Sit to Stand: Min guard         General transfer comment: min guard for safety; carry over of safe hand placement  Ambulation/Gait Ambulation/Gait assistance: Min assist Ambulation Distance (Feet): (1330ft then 4740ft) Assistive device: Rolling walker (2 wheeled) Gait Pattern/deviations: Step-through pattern;Decreased step length - right;Decreased stance time - left;Decreased weight shift to left;Antalgic;Trunk flexed Gait velocity: decreased   General Gait Details: cues for posture and safe use of  AD   Stairs Stairs: Yes   Stair Management: One rail Right;Step to pattern;Forwards Number of Stairs: 2 General stair comments: cues for sequencing and technique; HHA and R hand rail to descend  Wheelchair Mobility    Modified Rankin (Stroke Patients Only)       Balance Overall balance assessment: Needs assistance Sitting-balance support: No upper extremity supported;Feet supported Sitting balance-Leahy Scale: Good     Standing balance support: Bilateral upper extremity supported;During functional activity Standing balance-Leahy Scale: Poor Standing balance comment: Reliant on UE support                             Cognition Arousal/Alertness: Awake/alert Behavior During Therapy: WFL for tasks assessed/performed Overall Cognitive Status: Within Functional Limits for tasks assessed                                        Exercises      General Comments General comments (skin integrity, edema, etc.): daughter present throughout session      Pertinent Vitals/Pain Pain Assessment: Faces Faces Pain Scale: Hurts little more Pain Location: L knee  Pain Descriptors / Indicators: Guarding;Sore Pain Intervention(s): Limited activity within patient's tolerance;Monitored during session;Repositioned    Home Living                      Prior Function            PT Goals (current goals can now be found in the care plan section) Acute Rehab PT Goals Patient Stated Goal: to go home  PT Goal  Formulation: With patient Time For Goal Achievement: 01/16/17 Potential to Achieve Goals: Good Progress towards PT goals: Progressing toward goals    Frequency    7X/week      PT Plan Current plan remains appropriate    Co-evaluation              AM-PAC PT "6 Clicks" Daily Activity  Outcome Measure  Difficulty turning over in bed (including adjusting bedclothes, sheets and blankets)?: A Little Difficulty moving from lying on  back to sitting on the side of the bed? : A Little Difficulty sitting down on and standing up from a chair with arms (e.g., wheelchair, bedside commode, etc,.)?: Unable Help needed moving to and from a bed to chair (including a wheelchair)?: A Little Help needed walking in hospital room?: A Little Help needed climbing 3-5 steps with a railing? : A Little 6 Click Score: 16    End of Session Equipment Utilized During Treatment: Gait belt Activity Tolerance: Patient tolerated treatment well Patient left: with call bell/phone within reach;with family/visitor present;in bed Nurse Communication: Mobility status PT Visit Diagnosis: Other abnormalities of gait and mobility (R26.89);Unsteadiness on feet (R26.81);Pain Pain - Right/Left: Left Pain - part of body: Knee     Time: 1003-1035 PT Time Calculation (min) (ACUTE ONLY): 32 min  Charges:  $Gait Training: 23-37 mins                    G Codes:       Erline LevineKellyn Dezyrae Kensinger, PTA Pager: [phone]     Carolynne EdouardKellyn R Kaiulani Sitton 01/11/2017, 10:46 AM

## 2017-01-11 NOTE — NC FL2 (Signed)
[place] MEDICAID FL2 LEVEL OF CARE SCREENING TOOL     IDENTIFICATION  Patient Name: Tonya Johns Birthdate: January 18, 1931 Sex: female Admission Date (Current Location): 01/09/2017  [facility] [place]   Facility and Address:  [facility], [address], [place], [place] [address]      Provider Number: [id number]  Attending Physician Name and Address:  Gean Birchwoodowan, Frank, MD  Relative Name and Phone Number:  Edrick KinsDeborah Platt, daughter, [phone]    Current Level of Care: Hospital Recommended Level of Care: Skilled Nursing Facility Prior Approval Number:    Date Approved/Denied:   PASRR Number: [id number]  Discharge Plan: SNF    Current Diagnoses: Patient Active Problem List   Diagnosis Date Noted  . Primary osteoarthritis of left knee 01/09/2017  . Degenerative arthritis of left knee 01/05/2017  . Contact dermatitis and eczema due to plant 06/30/2015  . Elevated blood pressure reading in office without diagnosis of hypertension 03/09/2015  . Lumbar back pain 05/22/2014  . Abnormal urine odor 05/22/2014  . Uterine prolapse 02/19/2014  . Hypothyroidism following radioiodine therapy 07/26/2011  . Elevated glucose 04/07/2011  . Multinodular goiter 12/10/2010  . Knee pain, bilateral 11/25/2010  . Fatigue 11/25/2010  . Depression 11/25/2010  . Heart murmur 11/25/2010  . Preop cardiovascular exam 07/14/2010  . Trapezius muscle spasm 06/15/2010  . GERD (gastroesophageal reflux disease) 06/15/2010  . Hyperlipidemia 06/15/2010    Orientation RESPIRATION BLADDER Height & Weight     Self, Time, Situation, Place  Normal Continent Weight: 185 lb (83.9 kg) Height:  5\' 4"  (162.6 cm)  BEHAVIORAL SYMPTOMS/MOOD NEUROLOGICAL BOWEL NUTRITION STATUS      Continent Diet(See DC Summary)  AMBULATORY STATUS COMMUNICATION OF NEEDS Skin   Limited Assist Verbally Surgical wounds                       Personal Care Assistance Level of  Assistance  Dressing, Bathing, Feeding Bathing Assistance: Limited assistance Feeding assistance: Limited assistance Dressing Assistance: Limited assistance     Functional Limitations Info  Sight, Hearing, Speech Sight Info: Adequate Hearing Info: Adequate Speech Info: Adequate    SPECIAL CARE FACTORS FREQUENCY  PT (By licensed PT), OT (By licensed OT)     PT Frequency: 5x week OT Frequency: 5x week             Contractures Contractures Info: Not present    Additional Factors Info  Code Status, Allergies Code Status Info: Full Allergies Info: Penicillins           Current Medications (01/11/2017):  This is the current hospital active medication list Current Facility-Administered Medications  Medication Dose Route Frequency Provider Last Rate Last Dose  . acetaminophen (TYLENOL) tablet 650 mg  650 mg Oral Q4H PRN Allena KatzPhillips, Eric K, PA-C       Or  . acetaminophen (TYLENOL) suppository 650 mg  650 mg Rectal Q4H PRN Allena KatzPhillips, Eric K, PA-C      . alum & mag hydroxide-simeth (MAALOX/MYLANTA) 200-200-20 MG/5ML suspension 30 mL  30 mL Oral Q4H PRN Allena KatzPhillips, Eric K, PA-C      . aspirin EC tablet 325 mg  325 mg Oral Q breakfast Allena Katzhillips, Eric K, PA-C   325 mg at 01/11/17 0734  . bisacodyl (DULCOLAX) EC tablet 5 mg  5 mg Oral Daily PRN Allena KatzPhillips, Eric K, PA-C      . celecoxib (CELEBREX) capsule 200 mg  200 mg Oral Q12H Allena KatzPhillips, Eric K, PA-C  200 mg at 01/11/17 1111  . dextrose 5 % and 0.45 % NaCl with KCl 20 mEq/L infusion   Intravenous Continuous Allena Katzhillips, Eric K, PA-C 125 mL/hr at 01/09/17 2323    . diphenhydrAMINE (BENADRYL) 12.5 MG/5ML elixir 12.5-25 mg  12.5-25 mg Oral Q4H PRN Allena KatzPhillips, Eric K, PA-C      . docusate sodium (COLACE) capsule 100 mg  100 mg Oral BID Allena Katzhillips, Eric K, PA-C   100 mg at 01/11/17 1110  . gabapentin (NEURONTIN) capsule 300 mg  300 mg Oral TID Allena KatzPhillips, Eric K, PA-C   300 mg at 01/11/17 1111  . HYDROmorphone (DILAUDID) injection 0.5 mg  0.5 mg  Intravenous Q2H PRN Allena KatzPhillips, Eric K, PA-C      . levothyroxine (SYNTHROID, LEVOTHROID) tablet 100 mcg  100 mcg Oral QAC breakfast Allena Katzhillips, Eric K, PA-C   100 mcg at 01/11/17 0734  . menthol-cetylpyridinium (CEPACOL) lozenge 3 mg  1 lozenge Oral PRN Allena KatzPhillips, Eric K, PA-C       Or  . phenol (CHLORASEPTIC) mouth spray 1 spray  1 spray Mouth/Throat PRN Allena KatzPhillips, Eric K, PA-C      . methocarbamol (ROBAXIN) tablet 500 mg  500 mg Oral Q6H PRN Allena KatzPhillips, Eric K, PA-C   500 mg at 01/11/17 0250   Or  . methocarbamol (ROBAXIN) 500 mg in dextrose 5 % 50 mL IVPB  500 mg Intravenous Q6H PRN Allena KatzPhillips, Eric K, PA-C      . metoCLOPramide (REGLAN) tablet 5-10 mg  5-10 mg Oral Q8H PRN Allena KatzPhillips, Eric K, PA-C       Or  . metoCLOPramide (REGLAN) injection 5-10 mg  5-10 mg Intravenous Q8H PRN Allena KatzPhillips, Eric K, PA-C      . ondansetron [facility](ZOFRAN) tablet 4 mg  4 mg Oral Q6H PRN Allena KatzPhillips, Eric K, PA-C       Or  . ondansetron [facility](ZOFRAN) injection 4 mg  4 mg Intravenous Q6H PRN Allena KatzPhillips, Eric K, PA-C      . oxyCODONE (Oxy IR/ROXICODONE) immediate release tablet 10 mg  10 mg Oral Q3H PRN Dannielle BurnPhillips, Eric K, PA-C   10 mg at 01/11/17 0250  . oxyCODONE (Oxy IR/ROXICODONE) immediate release tablet 5 mg  5 mg Oral Q3H PRN Allena KatzPhillips, Eric K, PA-C   5 mg at 01/11/17 1115  . pravastatin (PRAVACHOL) tablet 80 mg  80 mg Oral Daily Allena Katzhillips, Eric K, PA-C   80 mg at 01/11/17 1110  . senna-docusate (Senokot-S) tablet 1 tablet  1 tablet Oral QHS PRN Allena KatzPhillips, Eric K, PA-C      . sodium phosphate (FLEET) 7-19 GM/118ML enema 1 enema  1 enema Rectal Once PRN Allena KatzPhillips, Eric K, PA-C         Discharge Medications: Please see discharge summary for a list of discharge medications.  Relevant Imaging Results:  Relevant Lab Results:   Additional Information SS#:[id number][id number]  Melessa Cowell V Shooter Tangen, LCSW

## 2017-01-11 NOTE — Progress Notes (Signed)
Occupational Therapy Treatment Patient Details Name: Tonya Johns MRN: [id number] DOB: 05-Apr-1930 Today's Date: 01/11/2017    History of present illness Pt is an 83 y/o female s/p elective L TKA. PMH includes RA, depression, DM, fibromyalgia, HTN, and back surgery.    OT comments  Pt currently demonstrates bed, lb dressing, shower transfer with min (A) and basic transfer. Pt with LOB backward onto bed surface during session due to fatigue. Pt needed min (A) to complete sit<>Stand.   Follow Up Recommendations  Home health OT    Equipment Recommendations  3 in 1 bedside commode;Other (comment)    Recommendations for Other Services Other (comment)    Precautions / Restrictions Precautions Precautions: Knee Precaution Booklet Issued: Yes (comment) Precaution Comments: positioning reviewed with pt Restrictions Weight Bearing Restrictions: Yes LLE Weight Bearing: Weight bearing as tolerated       Mobility Bed Mobility Overal bed mobility: Independent Bed Mobility: Sit to Supine       Sit to supine: Min guard   General bed mobility comments: min guard for safety; no physical assist needed  Transfers Overall transfer level: Needs assistance Equipment used: Rolling walker (2 wheeled) Transfers: Sit to/from Stand Sit to Stand: Min guard         General transfer comment: min guard for safety; carry over of safe hand placement    Balance Overall balance assessment: Needs assistance Sitting-balance support: No upper extremity supported;Feet supported Sitting balance-Leahy Scale: Good     Standing balance support: Bilateral upper extremity supported;During functional activity Standing balance-Leahy Scale: Poor Standing balance comment: Reliant on UE support                            ADL either performed or assessed with clinical judgement   ADL Overall ADL's : Needs assistance/impaired Eating/Feeding: Independent   Grooming: Wash/dry hands;Modified  independent       Lower Body Bathing: Min guard Lower Body Bathing Details (indicate cue type and reason): pt able to reach R LE without difficulty and able to reach ankle on L operative leg         Toilet Transfer: Min guard;RW       Tub/ Shower Transfer: Walk-in Best boyshower;Minimal assistance Tub/Shower Transfer Details (indicate cue type and reason): educated on walk in shower with return demo min (A) Functional mobility during ADLs: Min guard;Rolling walker General ADL Comments: pt daughter present and discussed entrance into the house due to weather. pt is to ride sons electric scooter from the curb to the back door and then 3 steps into the house in a covered garage     Vision       Perception     Praxis      Cognition Arousal/Alertness: Awake/alert Behavior During Therapy: WFL for tasks assessed/performed Overall Cognitive Status: Within Functional Limits for tasks assessed                                          Exercises     Shoulder Instructions       General Comments daughter present throughout session    Pertinent Vitals/ Pain       Pain Assessment: Faces Faces Pain Scale: Hurts little more Pain Location: l knee Pain Descriptors / Indicators: Guarding;Sore Pain Intervention(s): Limited activity within patient's tolerance;Monitored during session;Premedicated before session;Repositioned  Home Living  Prior Functioning/Environment              Frequency  Min 2X/week        Progress Toward Goals  OT Goals(current goals can now be found in the care plan section)  Progress towards OT goals: Progressing toward goals  Acute Rehab OT Goals Patient Stated Goal: to go home  OT Goal Formulation: With patient Time For Goal Achievement: 01/24/17 Potential to Achieve Goals: Good ADL Goals Pt Will Perform Lower Body Bathing: with supervision;with adaptive equipment;sit  to/from stand Pt Will Perform Lower Body Dressing: with supervision;sit to/from stand;with adaptive equipment Pt Will Perform Tub/Shower Transfer: Tub transfer;with supervision;tub bench;3 in 1;rolling walker  Plan Discharge plan remains appropriate    Co-evaluation                 AM-PAC PT "6 Clicks" Daily Activity     Outcome Measure   Help from another person eating meals?: None Help from another person taking care of personal grooming?: None Help from another person toileting, which includes using toliet, bedpan, or urinal?: A Lot Help from another person bathing (including washing, rinsing, drying)?: A Little Help from another person to put on and taking off regular upper body clothing?: A Little Help from another person to put on and taking off regular lower body clothing?: A Lot 6 Click Score: 18    End of Session Equipment Utilized During Treatment: Gait belt;Rolling walker  OT Visit Diagnosis: Unsteadiness on feet (R26.81);Muscle weakness (generalized) (M62.81)   Activity Tolerance     Patient Left in chair;with call bell/phone within reach;with family/visitor present   Nurse Communication Mobility status;Precautions        Time: [id number] OT Time Calculation (min): 25 min  Charges: OT General Charges $OT Visit: 1 Visit OT Treatments $Self Care/Home Management : 23-37 mins   Tonya Johns, Tonya Johns   OTR/L Pager: [phone] Office: [phone] .    Tonya Johns, Tonya Johns 01/11/2017, 2:28 PM

## 2017-01-11 NOTE — Clinical Social Work Note (Addendum)
Clinical Social Work Assessment  Patient Details  Name: Tonya Johns MRN: [id number] Date of Birth: 20-Oct-1930  Date of referral:  01/11/17               Reason for consult:  Facility Placement                Permission sought to share information with:  Chartered certified accountant granted to share information::  Yes, Verbal Permission Granted  Name::     Karie Chimera  Agency::  SNF  Relationship::     Contact Information:     Housing/Transportation Living arrangements for the past 2 months:  [place] of Information:  Patient, Adult Children Patient Interpreter Needed:  None Criminal Activity/Legal Involvement Pertinent to Current Situation/Hospitalization:  No - Comment as needed Significant Relationships:  Adult Children, Other Family Members Lives with:  Self Do you feel safe going back to the place where you live?  No Need for family participation in patient care:  Yes (Comment)  Care giving concerns: Pt from home with disabled son that she cares for. Her son cannot assist her as she cares for him. She was independent with ADL's prior to hospitalization and ambulated independently.  Given the new impairment and the limitations with mobility/ambulation, patient will benefit from skilled nursing per Physical Therapist. CSW met with patient and daughter at bedside to discuss.   (Pt was intended on going home, however, physical therapy indicated that patient needs more assistance. CSW became involved at this time going forward. CSW will await to hear from clinical team about disposition. CSW will be a back up as warranted)  Facilities manager / plan:  CSW explained the SNF process and placement. Pt has never experienced SNF before and had questions answered. CSW discussed local SNF's and patient would like to remain in [place] area. Pt would like a private room as well. CSW will set up transport at appropriate time. CSW f/u for  disposition.  Employment status:  Retired Nurse, adult PT Recommendations:  [place] / Referral to community resources:  [place]  Patient/Family's Response to care:  Psychologist, prison and probation services of CSW assisting with SNF options/placement. No issues or concerns identified.  Patient/Family's Understanding of and Emotional Response to Diagnosis, Current Treatment, and Prognosis:  Patient/family has good understanding of diagnosis, current treatment and prognosis and pt not sure if she will go home vs. SNF. CSW working on the plan for SNF as a back up is warranted. CSW will continue to follow for disposition. No issues or concerns identified.  Emotional Assessment Appearance:  Appears stated age Attitude/Demeanor/Rapport:  (Cooperative) Affect (typically observed):  Accepting, Appropriate Orientation:  Oriented to Situation, Oriented to  Time, Oriented to Place, Oriented to Self Alcohol / Substance use:  Not Applicable Psych involvement (Current and /or in the community):  No (Comment)  Discharge Needs  Concerns to be addressed:  Discharge Planning Concerns Readmission within the last 30 days:  No Current discharge risk:  Physical Impairment, Dependent with Mobility, Lives alone Barriers to Discharge:  No Barriers Identified   Normajean Baxter, LCSW 01/11/2017, 3:10 PM

## 2017-01-11 NOTE — Discharge Summary (Signed)
Patient ID: Tonya Johns MRN: [id number] DOB/AGE: 05/10/1930 83 y.o.  Admit date: 01/09/2017 Discharge date: 01/11/2017  Admission Diagnoses:  Principal Problem:   Degenerative arthritis of left knee Active Problems:   Primary osteoarthritis of left knee   Discharge Diagnoses:  Same  Past Medical History:  Diagnosis Date  . Anemia   . Arthritis   . Diverticulitis   . GERD (gastroesophageal reflux disease)   . Hyperlipidemia   . Hypothyroidism   . Leg swelling   . Osteoporosis     Surgeries: Procedure(s): TOTAL KNEE ARTHROPLASTY on 01/09/2017   Consultants:   Discharged Condition: Improved  Hospital Course: Tonya Johns is an 83 y.o. female who was admitted 01/09/2017 for operative treatment ofDegenerative arthritis of left knee. Patient has severe unremitting pain that affects sleep, daily activities, and work/hobbies. After pre-op clearance the patient was taken to the operating room on 01/09/2017 and underwent  Procedure(s): TOTAL KNEE ARTHROPLASTY.    Patient was given perioperative antibiotics:  Anti-infectives (From admission, onward)   Start     Dose/Rate Route Frequency Ordered Stop   01/09/17 0600  vancomycin (VANCOCIN) IVPB 1000 mg/200 mL premix     1,000 mg 200 mL/hr over 60 Minutes Intravenous To ShortStay Surgical 01/06/17 1116 01/09/17 0700       Patient was given sequential compression devices, early ambulation, and chemoprophylaxis to prevent DVT.  Patient benefited maximally from hospital stay and there were no complications.    Recent vital signs:  Patient Vitals for the past 24 hrs:  BP Temp Temp src Pulse Resp SpO2  01/11/17 0447 136/62 97.6 F (36.4 C) Oral 70 14 96 %  01/10/17 1900 (!) 160/67 98.8 F (37.1 C) Oral 71 14 99 %  01/10/17 1300 (!) 160/80 98 F (36.7 C) Oral 85 15 97 %     Recent laboratory studies:  Recent Labs    01/10/17 0411 01/11/17 0535  WBC 12.2* 10.7*  HGB 9.1* 8.7*  HCT 27.3* 26.1*  PLT 195 179  NA 131*  --    K 4.4  --   CL 100*  --   CO2 23  --   BUN 14  --   CREATININE 1.17*  --   GLUCOSE 175*  --   CALCIUM 8.6*  --      Discharge Medications:   Allergies as of 01/11/2017      Reactions   Penicillins Rash, Other (See Comments)   PATIENT HAS HAD A PCN REACTION WITH IMMEDIATE RASH, FACIAL/TONGUE/THROAT SWELLING, SOB, OR LIGHTHEADEDNESS WITH HYPOTENSION:  #  #  #  YES  #  #  #   Has patient had a PCN reaction causing severe rash involving mucus membranes or skin necrosis: No Has patient had a PCN reaction that required hospitalization: No PATIENT HAS HAD A PCN REACTION THAT REQUIRED HOSPITALIZATION:  #  #  #  YES  #  #  #       Medication List    STOP taking these medications   acetaminophen 500 MG tablet Commonly known as:  TYLENOL   diclofenac sodium 1 % Gel Commonly known as:  VOLTAREN     TAKE these medications   aspirin EC 325 MG tablet Take 1 tablet (325 mg total) by mouth 2 (two) times daily.   Co Q10 100 MG Caps Take 100 mg by mouth daily.   levothyroxine 100 MCG tablet Commonly known as:  SYNTHROID, LEVOTHROID TAKE 1 TABLET BY MOUTH  DAILY   MULTIVITAMIN PO  Take 1 tablet by mouth daily.   oxyCODONE-acetaminophen 5-325 MG tablet Commonly known as:  ROXICET Take 1 tablet by mouth every 4 (four) hours as needed.   pravastatin 80 MG tablet Commonly known as:  PRAVACHOL TAKE 1 TABLET BY MOUTH  DAILY   tiZANidine 2 MG tablet Commonly known as:  ZANAFLEX Take 1 tablet (2 mg total) by mouth every 6 (six) hours as needed for muscle spasms.            Durable Medical Equipment  (From admission, onward)        Start     Ordered   01/09/17 1318  DME Walker rolling  Once    Question:  Patient needs a walker to treat with the following condition  Answer:  Status post total left knee replacement   01/09/17 1317   01/09/17 1318  DME 3 n 1  Once     01/09/17 1317   01/09/17 1318  DME Bedside commode  Once    Question:  Patient needs a bedside commode to  treat with the following condition  Answer:  Status post total left knee replacement   01/09/17 1317      Diagnostic Studies: Dg Chest 2 View  Result Date: 01/02/2017 CLINICAL DATA:  Preoperative examination prior to left total knee arthroplasty. History of gastroesophageal reflux, hyperlipidemia, never smoked. EXAM: CHEST  2 VIEW COMPARISON:  None in PACs FINDINGS: The lungs are adequately inflated and clear. The heart and pulmonary vascularity are normal. The mediastinum is normal in width. There is no pleural effusion. There is calcification in the wall of the aortic arch. There is multilevel degenerative disc disease of the thoracic spine. IMPRESSION: There is no active cardiopulmonary disease. Thoracic aortic atherosclerosis. Electronically Signed   By: David  SwazilandJordan M.D.   On: 01/02/2017 14:01    Disposition: Final discharge disposition not confirmed  Discharge Instructions    Call MD / Call 911   Complete by:  As directed    If you experience chest pain or shortness of breath, CALL 911 and be transported to the hospital emergency room.  If you develope a fever above 101 F, pus (white drainage) or increased drainage or redness at the wound, or calf pain, call your surgeon's office.   Constipation Prevention   Complete by:  As directed    Drink plenty of fluids.  Prune juice may be helpful.  You may use a stool softener, such as Colace (over the counter) 100 mg twice a day.  Use MiraLax (over the counter) for constipation as needed.   Diet - low sodium heart healthy   Complete by:  As directed    Driving restrictions   Complete by:  As directed    No driving for 2 weeks   Increase activity slowly as tolerated   Complete by:  As directed    Patient may shower   Complete by:  As directed    You may shower without a dressing once there is no drainage.  Do not wash over the wound.  If drainage remains, cover wound with plastic wrap and then shower.      Follow-up Information     Gean Birchwoodowan, Frank, MD In 2 weeks.   Specialty:  Orthopedic Surgery Contact information: [address] [place] [address] [phone]            Signed: Henry RusselHILLIPS, ERIC R 01/11/2017, 11:39 AM

## 2017-01-12 LAB — PREPARE RBC (CROSSMATCH)

## 2017-01-12 LAB — CBC
HEMATOCRIT: 22.9 % — AB (ref 36.0–46.0)
HEMOGLOBIN: 7.7 g/dL — AB (ref 12.0–15.0)
MCH: 30.4 pg (ref 26.0–34.0)
MCHC: 33.6 g/dL (ref 30.0–36.0)
MCV: 90.5 fL (ref 78.0–100.0)
Platelets: 188 10*3/uL (ref 150–400)
RBC: 2.53 MIL/uL — AB (ref 3.87–5.11)
RDW: 13.8 % (ref 11.5–15.5)
WBC: 10.3 10*3/uL (ref 4.0–10.5)

## 2017-01-12 MED ORDER — SODIUM CHLORIDE 0.9 % IV SOLN: Freq: Once | INTRAVENOUS | Status: DC

## 2017-01-12 NOTE — Progress Notes (Signed)
PATIENT ID: Tonya Johns  MRN: [id number]  DOB/AGE:  July 20, 1930 / 83 y.o.  3 Days Post-Op Procedure(s) (LRB): TOTAL KNEE ARTHROPLASTY (Left)    PROGRESS NOTE Subjective: Patient is alert, oriented, no Nausea, no Vomiting, yes passing gas. Taking PO well. Denies SOB, Chest or Calf Pain. Using Incentive Spirometer, PAS in place. Ambulate , Patient reports pain as 4/10.  Feels fatigued Objective: Vital signs in last 24 hours: Vitals:   01/10/17 1900 01/11/17 0447 01/11/17 2032 01/12/17 0606  BP: (!) 160/67 136/62 (!) 126/49 (!) 123/51  Pulse: 71 70 83 81  Resp: 14 14 14 14   Temp: 98.8 F (37.1 C) 97.6 F (36.4 C) 98.2 F (36.8 C) 98.4 F (36.9 C)  TempSrc: Oral Oral Oral Oral  SpO2: 99% 96% 98% 99%  Weight:      Height:          Intake/Output from previous day: I/O last 3 completed shifts: In: 480 [P.O.:480] Out: -    Intake/Output this shift: No intake/output data recorded.   LABORATORY DATA: Recent Labs    01/10/17 0411 01/11/17 0535 01/12/17 0515  WBC 12.2* 10.7* 10.3  HGB 9.1* 8.7* 7.7*  HCT 27.3* 26.1* 22.9*  PLT 195 179 188  NA 131*  --   --   K 4.4  --   --   CL 100*  --   --   CO2 23  --   --   BUN 14  --   --   CREATININE 1.17*  --   --   GLUCOSE 175*  --   --   CALCIUM 8.6*  --   --     Examination: Neurologically intact ABD soft Neurovascular intact Sensation intact distally Intact pulses distally Dorsiflexion/Plantar flexion intact Incision: dressing C/D/I No cellulitis present Compartment soft}  Assessment:   3 Days Post-Op Procedure(s) (LRB): TOTAL KNEE ARTHROPLASTY (Left) ADDITIONAL DIAGNOSIS: Expected Acute Blood Loss Anemia,   Plan: PT/OT WBAT, AROM and PROM  DVT Prophylaxis:  SCDx72hrs, ASA 325 mg BID x 2 weeks DISCHARGE PLAN: SNF based on PT progress and duties at home DISCHARGE NEEDS: HHPT, Walker and 3-in-1 comode seat     Nestor LewandowskyFrank J Jmichael Gille 01/12/2017, 9:48 AM

## 2017-01-12 NOTE — Social Work (Signed)
CSW met with patient and daughter at bedside and provided them bed offers to review.  CSW will f/u.  Elissa Hefty, LCSW Clinical Social Worker [phone]

## 2017-01-12 NOTE — Progress Notes (Signed)
Physical Therapy Treatment Patient Details Name: Tonya Johns MRN: [id number] DOB: 24-Mar-1930 Today's Date: 01/12/2017    History of Present Illness Pt is an 83 y/o female s/p elective L TKA. PMH includes RA, depression, DM, fibromyalgia, HTN, and back surgery.     PT Comments    Patient tolerated session well and without c/o pain in R knee this am. Pt was able to negotiate 2 steps with R hand rail, SPC on L side, and min A. Pt fatigues quickly and with one posterior LOB when standing requiring assist to recover. Continue to progress as tolerated.    Follow Up Recommendations  DC plan and follow up therapy as arranged by surgeon;Supervision/Assistance - 24 hour     Equipment Recommendations  Rolling walker with 5" wheels    Recommendations for Other Services       Precautions / Restrictions Precautions Precautions: Knee Precaution Booklet Issued: Yes (comment) Precaution Comments: positioning reviewed with pt Restrictions Weight Bearing Restrictions: Yes LLE Weight Bearing: Weight bearing as tolerated    Mobility  Bed Mobility Overal bed mobility: Independent Bed Mobility: Sit to Supine;Supine to Sit              Transfers Overall transfer level: Needs assistance Equipment used: Rolling walker (2 wheeled) Transfers: Sit to/from Stand Sit to Stand: Min assist;Min guard         General transfer comment: min guard to stand and min A upon standing first trial from EOB as pt experienced LOB posteriorly and required assist to gain balance   Ambulation/Gait Ambulation/Gait assistance: Min assist Ambulation Distance (Feet): 90 Feet Assistive device: Rolling walker (2 wheeled) Gait Pattern/deviations: Decreased step length - right;Decreased stance time - left;Decreased weight shift to left;Antalgic;Trunk flexed;Step-through pattern;Decreased step length - left Gait velocity: decreased   General Gait Details: cues for posture and safe use of AD   Stairs Stairs:  Yes   Stair Management: One rail Right;Step to pattern;Forwards;With cane Number of Stairs: 2 General stair comments: cues for sequencing and use of AD; assist for balance and to safely use SPC   Wheelchair Mobility    Modified Rankin (Stroke Patients Only)       Balance Overall balance assessment: Needs assistance Sitting-balance support: No upper extremity supported;Feet supported Sitting balance-Leahy Scale: Good     Standing balance support: Bilateral upper extremity supported;During functional activity Standing balance-Leahy Scale: Poor Standing balance comment: Reliant on UE support                             Cognition Arousal/Alertness: Awake/alert Behavior During Therapy: WFL for tasks assessed/performed Overall Cognitive Status: Within Functional Limits for tasks assessed                                        Exercises      General Comments General comments (skin integrity, edema, etc.): daughter present      Pertinent Vitals/Pain Pain Assessment: Faces Faces Pain Scale: Hurts little more Pain Location: L knee  Pain Descriptors / Indicators: Guarding;Sore;Aching Pain Intervention(s): Limited activity within patient's tolerance;Monitored during session;Premedicated before session;Repositioned;Ice applied    Home Living                      Prior Function            PT Goals (current goals can now  be found in the care plan section) Acute Rehab PT Goals Patient Stated Goal: to go home  PT Goal Formulation: With patient Time For Goal Achievement: 01/16/17 Potential to Achieve Goals: Good Progress towards PT goals: Progressing toward goals    Frequency    7X/week      PT Plan Current plan remains appropriate    Co-evaluation              AM-PAC PT "6 Clicks" Daily Activity  Outcome Measure  Difficulty turning over in bed (including adjusting bedclothes, sheets and blankets)?: A Little Difficulty  moving from lying on back to sitting on the side of the bed? : A Little Difficulty sitting down on and standing up from a chair with arms (e.g., wheelchair, bedside commode, etc,.)?: Unable Help needed moving to and from a bed to chair (including a wheelchair)?: A Little Help needed walking in hospital room?: A Little Help needed climbing 3-5 steps with a railing? : A Little 6 Click Score: 16    End of Session Equipment Utilized During Treatment: Gait belt Activity Tolerance: Patient tolerated treatment well Patient left: with call bell/phone within reach;with family/visitor present;in bed Nurse Communication: Mobility status PT Visit Diagnosis: Other abnormalities of gait and mobility (R26.89);Unsteadiness on feet (R26.81);Pain Pain - Right/Left: Left Pain - part of body: Knee     Time: 0935-1006 PT Time Calculation (min) (ACUTE ONLY): 31 min  Charges:  $Gait Training: 8-22 mins $Therapeutic Activity: 8-22 mins                    G Codes:       Erline LevineKellyn Krystle Polcyn, PTA Pager: [phone]     Carolynne EdouardKellyn R Maecy Podgurski 01/12/2017, 10:22 AM

## 2017-01-12 NOTE — Progress Notes (Signed)
Pt's Hgb was 7.7 this AM. PA called and notified.

## 2017-01-12 NOTE — Progress Notes (Signed)
Physical Therapy Treatment Patient Details Name: Tonya Johns MRN: [id number] DOB: 11-20-1930 Today's Date: 01/12/2017    History of Present Illness Pt is an 83 y/o female s/p elective L TKA. PMH includes RA, depression, DM, fibromyalgia, HTN, and back surgery.     PT Comments    Patient is making gradual progress with some aspects of mobility including bed mobility and L knee ROM however pt continues to demonstrate decreased strength/activity tolerance and unsteadiness increasing risk for falls. Pt required +2 assist to stand from EOB this session due to fatigue. Recommending [facility] for further skilled PT services to maximize independence and safety with mobility.     Follow Up Recommendations  SNF;Supervision for mobility/OOB     Equipment Recommendations  Rolling walker with 5" wheels    Recommendations for Other Services       Precautions / Restrictions Precautions Precautions: Knee Precaution Booklet Issued: Yes (comment) Precaution Comments: positioning reviewed with pt and daughter Restrictions Weight Bearing Restrictions: Yes LLE Weight Bearing: Weight bearing as tolerated    Mobility  Bed Mobility Overal bed mobility: Independent Bed Mobility: Supine to Sit              Transfers Overall transfer level: Needs assistance Equipment used: Rolling walker (2 wheeled) Transfers: Sit to/from Stand Sit to Stand: Mod assist;+2 physical assistance Stand pivot transfers: Mod assist       General transfer comment: cues for safe hand placement and technique; pt attempted sit to stand from EOB with assist from daughter however unable to achieve standing; +2 assist required to power up into standing with pt using momentum and mod A to [facility]  Ambulation/Gait Ambulation/Gait assistance: Min assist Ambulation Distance (Feet): 98 Feet Assistive device: Rolling walker (2 wheeled) Gait Pattern/deviations: Decreased step length - right;Decreased stance time -  left;Decreased weight shift to left;Antalgic;Trunk flexed;Step-through pattern;Decreased step length - left Gait velocity: decreased   General Gait Details: multimodal cues for posture and safe proximity to RW; pt is making progress with step length symmetry and abilitiy to weight bear on L LE however pt does fatigue quickly; trunk and bilat LE flexed    Stairs            Wheelchair Mobility    Modified Rankin (Stroke Patients Only)       Balance Overall balance assessment: Needs assistance Sitting-balance support: No upper extremity supported;Feet supported Sitting balance-Leahy Scale: Good     Standing balance support: Bilateral upper extremity supported;During functional activity Standing balance-Leahy Scale: Poor Standing balance comment: Reliant on UE support                             Cognition Arousal/Alertness: Awake/alert Behavior During Therapy: WFL for tasks assessed/performed Overall Cognitive Status: Within Functional Limits for tasks assessed                                 General Comments: pt demonstrated decreased awareness of safety and deficits when attempting squat pivot EOB to [facility]      Exercises      General Comments General comments (skin integrity, edema, etc.): daughter present throughout session      Pertinent Vitals/Pain Pain Assessment: Faces Faces Pain Scale: Hurts even more Pain Location: L knee  Pain Descriptors / Indicators: Guarding;Sore;Grimacing Pain Intervention(s): Limited activity within patient's tolerance;Monitored during session;Premedicated before session;Repositioned    Home Living  Prior Function            PT Goals (current goals can now be found in the care plan section) Acute Rehab PT Goals PT Goal Formulation: With patient Time For Goal Achievement: 01/16/17 Potential to Achieve Goals: Good Progress towards PT goals: Progressing toward goals     Frequency    7X/week      PT Plan Discharge plan needs to be updated    Co-evaluation              AM-PAC PT "6 Clicks" Daily Activity  Outcome Measure  Difficulty turning over in bed (including adjusting bedclothes, sheets and blankets)?: A Little Difficulty moving from lying on back to sitting on the side of the bed? : A Little Difficulty sitting down on and standing up from a chair with arms (e.g., wheelchair, bedside commode, etc,.)?: Unable Help needed moving to and from a bed to chair (including a wheelchair)?: A Lot Help needed walking in hospital room?: A Little Help needed climbing 3-5 steps with a railing? : A Lot 6 Click Score: 14    End of Session Equipment Utilized During Treatment: Gait belt Activity Tolerance: Patient tolerated treatment well Patient left: with call bell/phone within reach;with family/visitor present;in chair Nurse Communication: Mobility status PT Visit Diagnosis: Other abnormalities of gait and mobility (R26.89);Unsteadiness on feet (R26.81);Pain Pain - Right/Left: Left Pain - part of body: Knee     Time: [id number] PT Time Calculation (min) (ACUTE ONLY): 29 min  Charges:  $Gait Training: 8-22 mins $Therapeutic Activity: 8-22 mins                    G Codes:       Tonya Johns, PTA Pager: [phone]     Tonya Johns 01/12/2017, 3:29 PM

## 2017-01-12 NOTE — Plan of Care (Signed)
  Pain Managment: General experience of comfort will improve 01/12/2017 0320 - Progressing by Sidonie Dickensulla, Neta Upadhyay, RN

## 2017-01-13 DIAGNOSIS — R278 Other lack of coordination: Secondary | ICD-10-CM | POA: Diagnosis not present

## 2017-01-13 DIAGNOSIS — Z471 Aftercare following joint replacement surgery: Secondary | ICD-10-CM | POA: Diagnosis not present

## 2017-01-13 DIAGNOSIS — M1712 Unilateral primary osteoarthritis, left knee: Secondary | ICD-10-CM | POA: Diagnosis not present

## 2017-01-13 DIAGNOSIS — E039 Hypothyroidism, unspecified: Secondary | ICD-10-CM | POA: Diagnosis not present

## 2017-01-13 DIAGNOSIS — M6281 Muscle weakness (generalized): Secondary | ICD-10-CM | POA: Diagnosis not present

## 2017-01-13 DIAGNOSIS — Z96652 Presence of left artificial knee joint: Secondary | ICD-10-CM | POA: Diagnosis not present

## 2017-01-13 DIAGNOSIS — R488 Other symbolic dysfunctions: Secondary | ICD-10-CM | POA: Diagnosis not present

## 2017-01-13 DIAGNOSIS — M25562 Pain in left knee: Secondary | ICD-10-CM | POA: Diagnosis not present

## 2017-01-13 DIAGNOSIS — K5900 Constipation, unspecified: Secondary | ICD-10-CM | POA: Diagnosis not present

## 2017-01-13 DIAGNOSIS — R2681 Unsteadiness on feet: Secondary | ICD-10-CM | POA: Diagnosis not present

## 2017-01-13 DIAGNOSIS — R262 Difficulty in walking, not elsewhere classified: Secondary | ICD-10-CM | POA: Diagnosis not present

## 2017-01-13 DIAGNOSIS — M25561 Pain in right knee: Secondary | ICD-10-CM | POA: Diagnosis not present

## 2017-01-13 DIAGNOSIS — D649 Anemia, unspecified: Secondary | ICD-10-CM | POA: Diagnosis not present

## 2017-01-13 DIAGNOSIS — S8002XA Contusion of left knee, initial encounter: Secondary | ICD-10-CM | POA: Diagnosis not present

## 2017-01-13 DIAGNOSIS — G8911 Acute pain due to trauma: Secondary | ICD-10-CM | POA: Diagnosis not present

## 2017-01-13 NOTE — Care Management Important Message (Signed)
Important Message  Patient Details  Name: Tonya Johns MRN: [id number] Date of Birth: 24-May-1930   Medicare Important Message Given:  Yes    [name] 01/13/2017, 2:07 PM

## 2017-01-13 NOTE — Discharge Summary (Signed)
Patient ID: Tonya Johns MRN: [id number] DOB/AGE: Nov 08, 1930 83 y.o.  Admit date: 01/09/2017 Discharge date: 01/13/2017  Admission Diagnoses:  Principal Problem:   Degenerative arthritis of left knee Active Problems:   Primary osteoarthritis of left knee   Discharge Diagnoses:  Same  Past Medical History:  Diagnosis Date  . Anemia   . Arthritis   . Diverticulitis   . GERD (gastroesophageal reflux disease)   . Hyperlipidemia   . Hypothyroidism   . Leg swelling   . Osteoporosis     Surgeries: Procedure(s): TOTAL KNEE ARTHROPLASTY on 01/09/2017   Consultants:   Discharged Condition: Improved  Hospital Course: Tonya Johns is an 83 y.o. female who was admitted 01/09/2017 for operative treatment ofDegenerative arthritis of left knee. Patient has severe unremitting pain that affects sleep, daily activities, and work/hobbies. After pre-op clearance the patient was taken to the operating room on 01/09/2017 and underwent  Procedure(s): TOTAL KNEE ARTHROPLASTY.    Patient was given perioperative antibiotics:  Anti-infectives (From admission, onward)   Start     Dose/Rate Route Frequency Ordered Stop   01/09/17 0600  vancomycin (VANCOCIN) IVPB 1000 mg/200 mL premix     1,000 mg 200 mL/hr over 60 Minutes Intravenous To ShortStay Surgical 01/06/17 1116 01/09/17 0700       Patient was given sequential compression devices, early ambulation, and chemoprophylaxis to prevent DVT.  Patient benefited maximally from hospital stay and there were no complications.    Recent vital signs:  Patient Vitals for the past 24 hrs:  BP Temp Temp src Pulse Resp SpO2  01/13/17 0352 (!) 122/52 - - - - -  01/13/17 0345 (!) 120/45 97.7 F (36.5 C) Oral 66 12 98 %  01/13/17 0130 (!) 119/46 98.6 F (37 C) Oral 71 12 98 %  01/13/17 0058 (!) 122/46 98.7 F (37.1 C) Oral 73 12 97 %  01/12/17 2215 (!) 131/44 98.8 F (37.1 C) Oral 85 15 97 %  01/12/17 2151 (!) 133/51 99.3 F (37.4 C) Oral 86 13 98  %  01/12/17 1927 (!) 130/46 98.4 F (36.9 C) Oral 79 12 100 %  01/12/17 1734 (!) 127/46 99.6 F (37.6 C) Oral 89 - 98 %     Recent laboratory studies:  Recent Labs    01/11/17 0535 01/12/17 0515  WBC 10.7* 10.3  HGB 8.7* 7.7*  HCT 26.1* 22.9*  PLT 179 188     Discharge Medications:   Allergies as of 01/13/2017      Reactions   Penicillins Rash, Other (See Comments)   PATIENT HAS HAD A PCN REACTION WITH IMMEDIATE RASH, FACIAL/TONGUE/THROAT SWELLING, SOB, OR LIGHTHEADEDNESS WITH HYPOTENSION:  #  #  #  YES  #  #  #   Has patient had a PCN reaction causing severe rash involving mucus membranes or skin necrosis: No Has patient had a PCN reaction that required hospitalization: No PATIENT HAS HAD A PCN REACTION THAT REQUIRED HOSPITALIZATION:  #  #  #  YES  #  #  #       Medication List    STOP taking these medications   acetaminophen 500 MG tablet Commonly known as:  TYLENOL   diclofenac sodium 1 % Gel Commonly known as:  VOLTAREN     TAKE these medications   aspirin EC 325 MG tablet Take 1 tablet (325 mg total) by mouth 2 (two) times daily.   Co Q10 100 MG Caps Take 100 mg by mouth daily.   levothyroxine 100  MCG tablet Commonly known as:  SYNTHROID, LEVOTHROID TAKE 1 TABLET BY MOUTH  DAILY   MULTIVITAMIN PO Take 1 tablet by mouth daily.   oxyCODONE-acetaminophen 5-325 MG tablet Commonly known as:  ROXICET Take 1 tablet by mouth every 4 (four) hours as needed.   pravastatin 80 MG tablet Commonly known as:  PRAVACHOL TAKE 1 TABLET BY MOUTH  DAILY   tiZANidine 2 MG tablet Commonly known as:  ZANAFLEX Take 1 tablet (2 mg total) by mouth every 6 (six) hours as needed for muscle spasms.            Durable Medical Equipment  (From admission, onward)        Start     Ordered   01/09/17 1318  DME Walker rolling  Once    Question:  Patient needs a walker to treat with the following condition  Answer:  Status post total left knee replacement   01/09/17  1317   01/09/17 1318  DME 3 n 1  Once     01/09/17 1317   01/09/17 1318  DME Bedside commode  Once    Question:  Patient needs a bedside commode to treat with the following condition  Answer:  Status post total left knee replacement   01/09/17 1317      Diagnostic Studies: Dg Chest 2 View  Result Date: 01/02/2017 CLINICAL DATA:  Preoperative examination prior to left total knee arthroplasty. History of gastroesophageal reflux, hyperlipidemia, never smoked. EXAM: CHEST  2 VIEW COMPARISON:  None in PACs FINDINGS: The lungs are adequately inflated and clear. The heart and pulmonary vascularity are normal. The mediastinum is normal in width. There is no pleural effusion. There is calcification in the wall of the aortic arch. There is multilevel degenerative disc disease of the thoracic spine. IMPRESSION: There is no active cardiopulmonary disease. Thoracic aortic atherosclerosis. Electronically Signed   By: David  [place] M.D.   On: 01/02/2017 14:01    Disposition: Final discharge disposition not confirmed  Discharge Instructions    Call MD / Call 911   Complete by:  As directed    If you experience chest pain or shortness of breath, CALL 911 and be transported to the hospital emergency room.  If you develope a fever above 101 F, pus (white drainage) or increased drainage or redness at the wound, or calf pain, call your surgeon's office.   Constipation Prevention   Complete by:  As directed    Drink plenty of fluids.  Prune juice may be helpful.  You may use a stool softener, such as Colace (over the counter) 100 mg twice a day.  Use MiraLax (over the counter) for constipation as needed.   Diet - low sodium heart healthy   Complete by:  As directed    Driving restrictions   Complete by:  As directed    No driving for 2 weeks   Increase activity slowly as tolerated   Complete by:  As directed    Patient may shower   Complete by:  As directed    You may shower without a dressing once there  is no drainage.  Do not wash over the wound.  If drainage remains, cover wound with plastic wrap and then shower.       Contact information for follow-up providers    Gean Birchwood, MD Follow up in 2 week(s).   Specialty:  Orthopedic Surgery Contact information: [address] [place] [address] [phone]  Contact information for after-discharge care    Destination    [facility] Follow up.   Service:  Skilled Nursing Contact information: [address] [place] [phone]                   Signed: Dannielle Burnric Phillips 01/13/2017, 12:06 PM

## 2017-01-13 NOTE — Progress Notes (Signed)
All discharge instructions reviewed with pt and daughter, copy given to pt's daughter per pt request.  Rx's x3 reviewed with pt and placed in folder for transport to SNF.  AKingRNBSN

## 2017-01-13 NOTE — Progress Notes (Signed)
This nurse attempt to call report to [facility] [phone], this nurse transferred by secretary phone rang until busy.  AKingRNBSN

## 2017-01-13 NOTE — Progress Notes (Signed)
Physical Therapy Treatment Patient Details Name: Tonya Johns MRN: [id number] DOB: December 19, 1930 Today's Date: 01/13/2017    History of Present Illness Pt is an 83 y/o female s/p elective L TKA. PMH includes RA, depression, DM, fibromyalgia, HTN, and back surgery.     PT Comments    Patient is making gradual progress toward mobility goals and reported less pain with mobility. Pt does continue to demonstrate decreased strength, activity tolerance, and awareness of deficits increasing risk for falls. Daughter present during session. Continue to progress as tolerated.    Follow Up Recommendations  SNF;Supervision for mobility/OOB     Equipment Recommendations  Rolling walker with 5" wheels    Recommendations for Other Services       Precautions / Restrictions Precautions Precautions: Knee Precaution Booklet Issued: Yes (comment) Precaution Comments: positioning reviewed with pt and daughter Restrictions Weight Bearing Restrictions: Yes LLE Weight Bearing: Weight bearing as tolerated    Mobility  Bed Mobility               General bed mobility comments: pt OOB in chair upon arrival  Transfers Overall transfer level: Needs assistance Equipment used: Rolling walker (2 wheeled) Transfers: Sit to/from Stand Sit to Stand: Min assist         General transfer comment: cues for safe hand placement; pt unsteady upon standing and with wide BOS to gain balance   Ambulation/Gait Ambulation/Gait assistance: Min assist Ambulation Distance (Feet): (9650ft then 4160ft) Assistive device: Rolling walker (2 wheeled) Gait Pattern/deviations: Decreased step length - right;Decreased stance time - left;Decreased weight shift to left;Antalgic;Trunk flexed;Step-through pattern;Decreased step length - left;Wide base of support Gait velocity: decreased   General Gait Details: multimodal cues for posture and bilat knee extension during stance phase; vc for safe use of AD   Stairs          General stair comments: sequencing and technique for stairs reviewed with R hand rail and SPC on L   Wheelchair Mobility    Modified Rankin (Stroke Patients Only)       Balance Overall balance assessment: Needs assistance Sitting-balance support: No upper extremity supported;Feet supported Sitting balance-Leahy Scale: Good     Standing balance support: Bilateral upper extremity supported;During functional activity Standing balance-Leahy Scale: Poor Standing balance comment: Reliant on UE support                             Cognition Arousal/Alertness: Awake/alert Behavior During Therapy: WFL for tasks assessed/performed Overall Cognitive Status: Within Functional Limits for tasks assessed Area of Impairment: Memory;Safety/judgement                     Memory: Decreased recall of precautions;Decreased short-term memory   Safety/Judgement: Decreased awareness of safety;Decreased awareness of deficits     General Comments: pt continues to demonstrate decreased safety awareness with transfers      Exercises Total Joint Exercises Ankle Circles/Pumps: AROM;Both;10 reps Quad Sets: AROM;Left;10 reps Short Arc Quad: AROM;Left;10 reps Heel Slides: AAROM;Left;10 reps Long Arc Quad: AROM;Left;10 reps Goniometric ROM: 90 degrees flexion     General Comments General comments (skin integrity, edema, etc.): daughter present throughout session      Pertinent Vitals/Pain Pain Assessment: Faces Faces Pain Scale: Hurts even more Pain Location: L knee  Pain Descriptors / Indicators: Guarding;Sore;Grimacing Pain Intervention(s): Limited activity within patient's tolerance;Monitored during session;Repositioned;Patient requesting pain meds-RN notified;Ice applied    Home Living  Prior Function            PT Goals (current goals can now be found in the care plan section) Acute Rehab PT Goals PT Goal Formulation: With  patient Time For Goal Achievement: 01/16/17 Potential to Achieve Goals: Good Progress towards PT goals: Progressing toward goals    Frequency    7X/week      PT Plan Current plan remains appropriate    Co-evaluation              AM-PAC PT "6 Clicks" Daily Activity  Outcome Measure  Difficulty turning over in bed (including adjusting bedclothes, sheets and blankets)?: None Difficulty moving from lying on back to sitting on the side of the bed? : A Little Difficulty sitting down on and standing up from a chair with arms (e.g., wheelchair, bedside commode, etc,.)?: Unable Help needed moving to and from a bed to chair (including a wheelchair)?: A Lot Help needed walking in hospital room?: A Little Help needed climbing 3-5 steps with a railing? : A Lot 6 Click Score: 15    End of Session Equipment Utilized During Treatment: Gait belt Activity Tolerance: Patient tolerated treatment well Patient left: with call bell/phone within reach;with family/visitor present;in chair Nurse Communication: Mobility status PT Visit Diagnosis: Other abnormalities of gait and mobility (R26.89);Unsteadiness on feet (R26.81);Pain Pain - Right/Left: Left Pain - part of body: Knee     Time: 1005-1035 PT Time Calculation (min) (ACUTE ONLY): 30 min  Charges:  $Gait Training: 8-22 mins $Therapeutic Exercise: 8-22 mins                    G Codes:       Tonya Johns, PTA Pager: [phone]     Tonya Johns 01/13/2017, 1:31 PM

## 2017-01-13 NOTE — Social Work (Signed)
Clinical Social Worker facilitated patient discharge including contacting patient family and facility to confirm patient discharge plans.  Clinical information faxed to facility and family agreeable with plan.    CSW arranged ambulance transport via [facility] to [facility] .    RN to call [phone] to give report prior to discharge.  Clinical Social Worker will sign off for now as social work intervention is no longer needed. Please consult us again if new need arises.  Tanaya Dunigan, LCSW Clinical Social Worker [phone]    

## 2017-01-13 NOTE — Clinical Social Work Placement (Signed)
   CLINICAL SOCIAL WORK PLACEMENT  NOTE  Date:  01/13/2017  Patient Details  Name: Tonya Johns MRN: [id number] Date of Birth: Jun 27, 1930  Clinical Social Work is seeking post-discharge placement for this patient at the Skilled  Nursing Facility level of care (*CSW will initial, date and re-position this form in  chart as items are completed):  Yes   Patient/family provided with [facility] Clinical Social Work Department's list of facilities offering this level of care within the geographic area requested by the patient (or if unable, by the patient's family).  Yes   Patient/family informed of their freedom to choose among providers that offer the needed level of care, that participate in Medicare, Medicaid or managed care program needed by the patient, have an available bed and are willing to accept the patient.  Yes   Patient/family informed of [facility]'s ownership interest in [facility] and [facility], as well as of the fact that they are under no obligation to receive care at these facilities.  PASRR submitted to EDS on       PASRR number received on 01/11/17     Existing PASRR number confirmed on       FL2 transmitted to all facilities in geographic area requested by pt/family on 01/11/17     FL2 transmitted to all facilities within larger geographic area on       Patient informed that his/her managed care company has contracts with or will negotiate with certain facilities, including the following:        Yes   Patient/family informed of bed offers received.  [facility]     Physician recommends and patient chooses bed at      [facility].  Patient to be transferred to facility by [facility]     Patient family notified on 01/13/17 of transfer.  Name of family member notified:  Gavin Poundeborah at bedside     PHYSICIAN Please prepare priority discharge summary, including medications, Please prepare  prescriptions     Additional Comment:    _______________________________________________ Tresa MoorePatricia V Andjela Wickes, LCSW 01/13/2017, 12:34 PM

## 2017-01-13 NOTE — Progress Notes (Signed)
PATIENT ID: Tonya Johns  MRN: [id number]  DOB/AGE:  1930-08-14 / 83 y.o.  4 Days Post-Op Procedure(s) (LRB): TOTAL KNEE ARTHROPLASTY (Left)    PROGRESS NOTE Subjective: Patient is alert, oriented, no Nausea, no Vomiting, yes passing gas. Taking PO well. Denies SOB, Chest or Calf Pain. Using Incentive Spirometer, PAS in place. Ambulate WBAT with pt walking 98 ft with therapy , Patient reports pain as mild to moderate .  She was feeling weak and fatigued yesterday and this was interfering with therapy.  She was given 2 units of RBC's and this has perked her up, per the patient.  Objective: Vital signs in last 24 hours: Vitals:   01/13/17 0058 01/13/17 0130 01/13/17 0345 01/13/17 0352  BP: (!) 122/46 (!) 119/46 (!) 120/45 (!) 122/52  Pulse: 73 71 66   Resp: 12 12 12    Temp: 98.7 F (37.1 C) 98.6 F (37 C) 97.7 F (36.5 C)   TempSrc: Oral Oral Oral   SpO2: 97% 98% 98%   Weight:      Height:          Intake/Output from previous day: I/O last 3 completed shifts: In: 971 [I.V.:250; Blood:721] Out: -    Intake/Output this shift: No intake/output data recorded.   LABORATORY DATA: Recent Labs    01/11/17 0535 01/12/17 0515  WBC 10.7* 10.3  HGB 8.7* 7.7*  HCT 26.1* 22.9*  PLT 179 188    Examination: Neurologically intact Neurovascular intact Sensation intact distally Intact pulses distally Dorsiflexion/Plantar flexion intact Incision: dressing C/D/I and scant drainage No cellulitis present Compartment soft}  Assessment:   4 Days Post-Op Procedure(s) (LRB): TOTAL KNEE ARTHROPLASTY (Left) ADDITIONAL DIAGNOSIS: Expected Acute Blood Loss Anemia,   Plan: PT/OT WBAT, AROM and PROM  DVT Prophylaxis:  SCDx72hrs, ASA 325 mg BID x 2 weeks DISCHARGE PLAN: Home if pt passes therapy goals.  SNF if pt not able to meet goals today. DISCHARGE NEEDS: HHPT, Walker and 3-in-1 comode seat     Shamikia Linskey R 01/13/2017, 7:31 AM

## 2017-01-14 LAB — BPAM RBC
BLOOD PRODUCT EXPIRATION DATE: 201901082359
Blood Product Expiration Date: 201901092359
ISSUE DATE / TIME: 201812132137
ISSUE DATE / TIME: 201812140101
UNIT TYPE AND RH: 5100
Unit Type and Rh: 5100

## 2017-01-14 LAB — TYPE AND SCREEN
ABO/RH(D): O POS
Antibody Screen: NEGATIVE
Unit division: 0
Unit division: 0

## 2017-01-16 DIAGNOSIS — E039 Hypothyroidism, unspecified: Secondary | ICD-10-CM | POA: Diagnosis not present

## 2017-01-16 DIAGNOSIS — Z96652 Presence of left artificial knee joint: Secondary | ICD-10-CM | POA: Diagnosis not present

## 2017-01-16 DIAGNOSIS — D649 Anemia, unspecified: Secondary | ICD-10-CM | POA: Diagnosis not present

## 2017-01-16 DIAGNOSIS — K5900 Constipation, unspecified: Secondary | ICD-10-CM | POA: Diagnosis not present

## 2017-01-17 DIAGNOSIS — M25561 Pain in right knee: Secondary | ICD-10-CM | POA: Diagnosis not present

## 2017-01-17 DIAGNOSIS — Z96652 Presence of left artificial knee joint: Secondary | ICD-10-CM | POA: Diagnosis not present

## 2017-01-17 DIAGNOSIS — R262 Difficulty in walking, not elsewhere classified: Secondary | ICD-10-CM | POA: Diagnosis not present

## 2017-01-17 DIAGNOSIS — R2681 Unsteadiness on feet: Secondary | ICD-10-CM | POA: Diagnosis not present

## 2017-01-17 DIAGNOSIS — M25562 Pain in left knee: Secondary | ICD-10-CM | POA: Diagnosis not present

## 2017-01-18 DIAGNOSIS — M1712 Unilateral primary osteoarthritis, left knee: Secondary | ICD-10-CM | POA: Diagnosis not present

## 2017-01-18 DIAGNOSIS — Z471 Aftercare following joint replacement surgery: Secondary | ICD-10-CM | POA: Diagnosis not present

## 2017-01-18 DIAGNOSIS — Z96652 Presence of left artificial knee joint: Secondary | ICD-10-CM | POA: Diagnosis not present

## 2017-01-21 DIAGNOSIS — D649 Anemia, unspecified: Secondary | ICD-10-CM | POA: Diagnosis not present

## 2017-01-21 DIAGNOSIS — Z96652 Presence of left artificial knee joint: Secondary | ICD-10-CM | POA: Diagnosis not present

## 2017-01-21 DIAGNOSIS — Z471 Aftercare following joint replacement surgery: Secondary | ICD-10-CM | POA: Diagnosis not present

## 2017-01-21 DIAGNOSIS — M6281 Muscle weakness (generalized): Secondary | ICD-10-CM | POA: Diagnosis not present

## 2017-01-21 DIAGNOSIS — M810 Age-related osteoporosis without current pathological fracture: Secondary | ICD-10-CM | POA: Diagnosis not present

## 2017-01-21 DIAGNOSIS — Z79891 Long term (current) use of opiate analgesic: Secondary | ICD-10-CM | POA: Diagnosis not present

## 2017-01-21 DIAGNOSIS — Z7982 Long term (current) use of aspirin: Secondary | ICD-10-CM | POA: Diagnosis not present

## 2017-01-23 DIAGNOSIS — D649 Anemia, unspecified: Secondary | ICD-10-CM | POA: Diagnosis not present

## 2017-01-23 DIAGNOSIS — M810 Age-related osteoporosis without current pathological fracture: Secondary | ICD-10-CM | POA: Diagnosis not present

## 2017-01-23 DIAGNOSIS — Z96652 Presence of left artificial knee joint: Secondary | ICD-10-CM | POA: Diagnosis not present

## 2017-01-23 DIAGNOSIS — Z471 Aftercare following joint replacement surgery: Secondary | ICD-10-CM | POA: Diagnosis not present

## 2017-01-23 DIAGNOSIS — M6281 Muscle weakness (generalized): Secondary | ICD-10-CM | POA: Diagnosis not present

## 2017-01-23 DIAGNOSIS — Z7982 Long term (current) use of aspirin: Secondary | ICD-10-CM | POA: Diagnosis not present

## 2017-01-23 DIAGNOSIS — Z79891 Long term (current) use of opiate analgesic: Secondary | ICD-10-CM | POA: Diagnosis not present

## 2017-01-25 DIAGNOSIS — Z96652 Presence of left artificial knee joint: Secondary | ICD-10-CM | POA: Diagnosis not present

## 2017-01-25 DIAGNOSIS — M810 Age-related osteoporosis without current pathological fracture: Secondary | ICD-10-CM | POA: Diagnosis not present

## 2017-01-25 DIAGNOSIS — Z7982 Long term (current) use of aspirin: Secondary | ICD-10-CM | POA: Diagnosis not present

## 2017-01-25 DIAGNOSIS — M6281 Muscle weakness (generalized): Secondary | ICD-10-CM | POA: Diagnosis not present

## 2017-01-25 DIAGNOSIS — Z471 Aftercare following joint replacement surgery: Secondary | ICD-10-CM | POA: Diagnosis not present

## 2017-01-25 DIAGNOSIS — Z79891 Long term (current) use of opiate analgesic: Secondary | ICD-10-CM | POA: Diagnosis not present

## 2017-01-25 DIAGNOSIS — D649 Anemia, unspecified: Secondary | ICD-10-CM | POA: Diagnosis not present

## 2017-01-26 ENCOUNTER — Telehealth: Payer: Self-pay | Admitting: Family Medicine

## 2017-01-26 DIAGNOSIS — Z7982 Long term (current) use of aspirin: Secondary | ICD-10-CM | POA: Diagnosis not present

## 2017-01-26 DIAGNOSIS — Z79891 Long term (current) use of opiate analgesic: Secondary | ICD-10-CM | POA: Diagnosis not present

## 2017-01-26 DIAGNOSIS — M6281 Muscle weakness (generalized): Secondary | ICD-10-CM | POA: Diagnosis not present

## 2017-01-26 DIAGNOSIS — Z96652 Presence of left artificial knee joint: Secondary | ICD-10-CM | POA: Diagnosis not present

## 2017-01-26 DIAGNOSIS — Z471 Aftercare following joint replacement surgery: Secondary | ICD-10-CM | POA: Diagnosis not present

## 2017-01-26 DIAGNOSIS — D649 Anemia, unspecified: Secondary | ICD-10-CM | POA: Diagnosis not present

## 2017-01-26 DIAGNOSIS — M810 Age-related osteoporosis without current pathological fracture: Secondary | ICD-10-CM | POA: Diagnosis not present

## 2017-01-26 NOTE — Telephone Encounter (Signed)
Contacted michelle and these orders are in regards to her recent knee surgery.  Advised that these orders would need to come from the surgeon.

## 2017-01-26 NOTE — Telephone Encounter (Signed)
Copied from CRM [phone]#[id number]. Topic: Quick Communication - See Telephone Encounter >> Jan 26, 2017  8:41 AM [place], Princella PellegriniJessica D wrote: CRM for notification. See Telephone encounter for:  [facility]arcelino [facility]Michelle [facility] orders for OT, once a week for 1 week and  twice a week for 3 weeks.  Contact number [phone] 01/26/17.

## 2017-01-30 DIAGNOSIS — M810 Age-related osteoporosis without current pathological fracture: Secondary | ICD-10-CM | POA: Diagnosis not present

## 2017-01-30 DIAGNOSIS — Z79891 Long term (current) use of opiate analgesic: Secondary | ICD-10-CM | POA: Diagnosis not present

## 2017-01-30 DIAGNOSIS — Z7982 Long term (current) use of aspirin: Secondary | ICD-10-CM | POA: Diagnosis not present

## 2017-01-30 DIAGNOSIS — Z96652 Presence of left artificial knee joint: Secondary | ICD-10-CM | POA: Diagnosis not present

## 2017-01-30 DIAGNOSIS — Z471 Aftercare following joint replacement surgery: Secondary | ICD-10-CM | POA: Diagnosis not present

## 2017-01-30 DIAGNOSIS — M6281 Muscle weakness (generalized): Secondary | ICD-10-CM | POA: Diagnosis not present

## 2017-01-30 DIAGNOSIS — D649 Anemia, unspecified: Secondary | ICD-10-CM | POA: Diagnosis not present

## 2017-02-02 DIAGNOSIS — M6281 Muscle weakness (generalized): Secondary | ICD-10-CM | POA: Diagnosis not present

## 2017-02-02 DIAGNOSIS — Z96652 Presence of left artificial knee joint: Secondary | ICD-10-CM | POA: Diagnosis not present

## 2017-02-02 DIAGNOSIS — Z79891 Long term (current) use of opiate analgesic: Secondary | ICD-10-CM | POA: Diagnosis not present

## 2017-02-02 DIAGNOSIS — M810 Age-related osteoporosis without current pathological fracture: Secondary | ICD-10-CM | POA: Diagnosis not present

## 2017-02-02 DIAGNOSIS — Z471 Aftercare following joint replacement surgery: Secondary | ICD-10-CM | POA: Diagnosis not present

## 2017-02-02 DIAGNOSIS — D649 Anemia, unspecified: Secondary | ICD-10-CM | POA: Diagnosis not present

## 2017-02-02 DIAGNOSIS — Z7982 Long term (current) use of aspirin: Secondary | ICD-10-CM | POA: Diagnosis not present

## 2017-02-03 DIAGNOSIS — Z471 Aftercare following joint replacement surgery: Secondary | ICD-10-CM | POA: Diagnosis not present

## 2017-02-03 DIAGNOSIS — Z96652 Presence of left artificial knee joint: Secondary | ICD-10-CM | POA: Diagnosis not present

## 2017-02-03 DIAGNOSIS — M6281 Muscle weakness (generalized): Secondary | ICD-10-CM | POA: Diagnosis not present

## 2017-02-03 DIAGNOSIS — D649 Anemia, unspecified: Secondary | ICD-10-CM | POA: Diagnosis not present

## 2017-02-03 DIAGNOSIS — Z7982 Long term (current) use of aspirin: Secondary | ICD-10-CM | POA: Diagnosis not present

## 2017-02-03 DIAGNOSIS — M810 Age-related osteoporosis without current pathological fracture: Secondary | ICD-10-CM | POA: Diagnosis not present

## 2017-02-03 DIAGNOSIS — Z79891 Long term (current) use of opiate analgesic: Secondary | ICD-10-CM | POA: Diagnosis not present

## 2017-02-04 DIAGNOSIS — D649 Anemia, unspecified: Secondary | ICD-10-CM | POA: Diagnosis not present

## 2017-02-04 DIAGNOSIS — Z96652 Presence of left artificial knee joint: Secondary | ICD-10-CM | POA: Diagnosis not present

## 2017-02-04 DIAGNOSIS — M6281 Muscle weakness (generalized): Secondary | ICD-10-CM | POA: Diagnosis not present

## 2017-02-04 DIAGNOSIS — Z79891 Long term (current) use of opiate analgesic: Secondary | ICD-10-CM | POA: Diagnosis not present

## 2017-02-04 DIAGNOSIS — Z471 Aftercare following joint replacement surgery: Secondary | ICD-10-CM | POA: Diagnosis not present

## 2017-02-04 DIAGNOSIS — M810 Age-related osteoporosis without current pathological fracture: Secondary | ICD-10-CM | POA: Diagnosis not present

## 2017-02-04 DIAGNOSIS — Z7982 Long term (current) use of aspirin: Secondary | ICD-10-CM | POA: Diagnosis not present

## 2017-02-06 DIAGNOSIS — M810 Age-related osteoporosis without current pathological fracture: Secondary | ICD-10-CM | POA: Diagnosis not present

## 2017-02-06 DIAGNOSIS — Z7982 Long term (current) use of aspirin: Secondary | ICD-10-CM | POA: Diagnosis not present

## 2017-02-06 DIAGNOSIS — M6281 Muscle weakness (generalized): Secondary | ICD-10-CM | POA: Diagnosis not present

## 2017-02-06 DIAGNOSIS — D649 Anemia, unspecified: Secondary | ICD-10-CM | POA: Diagnosis not present

## 2017-02-06 DIAGNOSIS — Z79891 Long term (current) use of opiate analgesic: Secondary | ICD-10-CM | POA: Diagnosis not present

## 2017-02-06 DIAGNOSIS — Z96652 Presence of left artificial knee joint: Secondary | ICD-10-CM | POA: Diagnosis not present

## 2017-02-06 DIAGNOSIS — Z471 Aftercare following joint replacement surgery: Secondary | ICD-10-CM | POA: Diagnosis not present

## 2017-02-07 DIAGNOSIS — Z7982 Long term (current) use of aspirin: Secondary | ICD-10-CM | POA: Diagnosis not present

## 2017-02-07 DIAGNOSIS — M6281 Muscle weakness (generalized): Secondary | ICD-10-CM | POA: Diagnosis not present

## 2017-02-07 DIAGNOSIS — M1712 Unilateral primary osteoarthritis, left knee: Secondary | ICD-10-CM | POA: Diagnosis not present

## 2017-02-07 DIAGNOSIS — M810 Age-related osteoporosis without current pathological fracture: Secondary | ICD-10-CM | POA: Diagnosis not present

## 2017-02-07 DIAGNOSIS — Z471 Aftercare following joint replacement surgery: Secondary | ICD-10-CM | POA: Diagnosis not present

## 2017-02-07 DIAGNOSIS — Z79891 Long term (current) use of opiate analgesic: Secondary | ICD-10-CM | POA: Diagnosis not present

## 2017-02-07 DIAGNOSIS — D649 Anemia, unspecified: Secondary | ICD-10-CM | POA: Diagnosis not present

## 2017-02-07 DIAGNOSIS — Z96652 Presence of left artificial knee joint: Secondary | ICD-10-CM | POA: Diagnosis not present

## 2017-02-08 DIAGNOSIS — Z7982 Long term (current) use of aspirin: Secondary | ICD-10-CM | POA: Diagnosis not present

## 2017-02-08 DIAGNOSIS — M6281 Muscle weakness (generalized): Secondary | ICD-10-CM | POA: Diagnosis not present

## 2017-02-08 DIAGNOSIS — M810 Age-related osteoporosis without current pathological fracture: Secondary | ICD-10-CM | POA: Diagnosis not present

## 2017-02-08 DIAGNOSIS — Z79891 Long term (current) use of opiate analgesic: Secondary | ICD-10-CM | POA: Diagnosis not present

## 2017-02-08 DIAGNOSIS — D649 Anemia, unspecified: Secondary | ICD-10-CM | POA: Diagnosis not present

## 2017-02-08 DIAGNOSIS — Z471 Aftercare following joint replacement surgery: Secondary | ICD-10-CM | POA: Diagnosis not present

## 2017-02-08 DIAGNOSIS — Z96652 Presence of left artificial knee joint: Secondary | ICD-10-CM | POA: Diagnosis not present

## 2017-02-09 DIAGNOSIS — Z96652 Presence of left artificial knee joint: Secondary | ICD-10-CM | POA: Diagnosis not present

## 2017-02-09 DIAGNOSIS — M810 Age-related osteoporosis without current pathological fracture: Secondary | ICD-10-CM | POA: Diagnosis not present

## 2017-02-09 DIAGNOSIS — Z79891 Long term (current) use of opiate analgesic: Secondary | ICD-10-CM | POA: Diagnosis not present

## 2017-02-09 DIAGNOSIS — Z471 Aftercare following joint replacement surgery: Secondary | ICD-10-CM | POA: Diagnosis not present

## 2017-02-09 DIAGNOSIS — M6281 Muscle weakness (generalized): Secondary | ICD-10-CM | POA: Diagnosis not present

## 2017-02-09 DIAGNOSIS — Z7982 Long term (current) use of aspirin: Secondary | ICD-10-CM | POA: Diagnosis not present

## 2017-02-09 DIAGNOSIS — D649 Anemia, unspecified: Secondary | ICD-10-CM | POA: Diagnosis not present

## 2017-02-10 ENCOUNTER — Telehealth: Payer: Self-pay | Admitting: Family Medicine

## 2017-02-10 DIAGNOSIS — M6281 Muscle weakness (generalized): Secondary | ICD-10-CM | POA: Diagnosis not present

## 2017-02-10 DIAGNOSIS — Z471 Aftercare following joint replacement surgery: Secondary | ICD-10-CM | POA: Diagnosis not present

## 2017-02-10 DIAGNOSIS — M810 Age-related osteoporosis without current pathological fracture: Secondary | ICD-10-CM | POA: Diagnosis not present

## 2017-02-10 DIAGNOSIS — D649 Anemia, unspecified: Secondary | ICD-10-CM | POA: Diagnosis not present

## 2017-02-10 DIAGNOSIS — Z96652 Presence of left artificial knee joint: Secondary | ICD-10-CM | POA: Diagnosis not present

## 2017-02-10 DIAGNOSIS — Z79891 Long term (current) use of opiate analgesic: Secondary | ICD-10-CM | POA: Diagnosis not present

## 2017-02-10 DIAGNOSIS — Z7982 Long term (current) use of aspirin: Secondary | ICD-10-CM | POA: Diagnosis not present

## 2017-02-10 NOTE — Telephone Encounter (Signed)
Forms completed

## 2017-02-10 NOTE — Telephone Encounter (Signed)
We have not seen pt since 08/2016, please advise?

## 2017-02-10 NOTE — Telephone Encounter (Signed)
Received paperwork for Home Health Certification via fax. Placed in bin up front with charge sheet.

## 2017-02-13 DIAGNOSIS — D649 Anemia, unspecified: Secondary | ICD-10-CM | POA: Diagnosis not present

## 2017-02-13 DIAGNOSIS — Z7982 Long term (current) use of aspirin: Secondary | ICD-10-CM | POA: Diagnosis not present

## 2017-02-13 DIAGNOSIS — Z471 Aftercare following joint replacement surgery: Secondary | ICD-10-CM | POA: Diagnosis not present

## 2017-02-13 DIAGNOSIS — Z96652 Presence of left artificial knee joint: Secondary | ICD-10-CM | POA: Diagnosis not present

## 2017-02-13 DIAGNOSIS — M6281 Muscle weakness (generalized): Secondary | ICD-10-CM | POA: Diagnosis not present

## 2017-02-13 DIAGNOSIS — Z79891 Long term (current) use of opiate analgesic: Secondary | ICD-10-CM | POA: Diagnosis not present

## 2017-02-13 DIAGNOSIS — M810 Age-related osteoporosis without current pathological fracture: Secondary | ICD-10-CM | POA: Diagnosis not present

## 2017-02-14 DIAGNOSIS — D649 Anemia, unspecified: Secondary | ICD-10-CM | POA: Diagnosis not present

## 2017-02-14 DIAGNOSIS — M6281 Muscle weakness (generalized): Secondary | ICD-10-CM | POA: Diagnosis not present

## 2017-02-14 DIAGNOSIS — M810 Age-related osteoporosis without current pathological fracture: Secondary | ICD-10-CM | POA: Diagnosis not present

## 2017-02-14 DIAGNOSIS — Z471 Aftercare following joint replacement surgery: Secondary | ICD-10-CM | POA: Diagnosis not present

## 2017-02-14 DIAGNOSIS — Z96652 Presence of left artificial knee joint: Secondary | ICD-10-CM | POA: Diagnosis not present

## 2017-02-14 DIAGNOSIS — Z79891 Long term (current) use of opiate analgesic: Secondary | ICD-10-CM | POA: Diagnosis not present

## 2017-02-14 DIAGNOSIS — Z7982 Long term (current) use of aspirin: Secondary | ICD-10-CM | POA: Diagnosis not present

## 2017-02-15 DIAGNOSIS — M810 Age-related osteoporosis without current pathological fracture: Secondary | ICD-10-CM | POA: Diagnosis not present

## 2017-02-15 DIAGNOSIS — Z79891 Long term (current) use of opiate analgesic: Secondary | ICD-10-CM | POA: Diagnosis not present

## 2017-02-15 DIAGNOSIS — Z471 Aftercare following joint replacement surgery: Secondary | ICD-10-CM | POA: Diagnosis not present

## 2017-02-15 DIAGNOSIS — Z96652 Presence of left artificial knee joint: Secondary | ICD-10-CM | POA: Diagnosis not present

## 2017-02-15 DIAGNOSIS — D649 Anemia, unspecified: Secondary | ICD-10-CM | POA: Diagnosis not present

## 2017-02-15 DIAGNOSIS — M6281 Muscle weakness (generalized): Secondary | ICD-10-CM | POA: Diagnosis not present

## 2017-02-15 DIAGNOSIS — Z7982 Long term (current) use of aspirin: Secondary | ICD-10-CM | POA: Diagnosis not present

## 2017-02-16 DIAGNOSIS — M2550 Pain in unspecified joint: Secondary | ICD-10-CM | POA: Diagnosis not present

## 2017-02-16 DIAGNOSIS — M150 Primary generalized (osteo)arthritis: Secondary | ICD-10-CM | POA: Diagnosis not present

## 2017-02-16 DIAGNOSIS — R768 Other specified abnormal immunological findings in serum: Secondary | ICD-10-CM | POA: Diagnosis not present

## 2017-02-17 DIAGNOSIS — M810 Age-related osteoporosis without current pathological fracture: Secondary | ICD-10-CM | POA: Diagnosis not present

## 2017-02-17 DIAGNOSIS — Z96652 Presence of left artificial knee joint: Secondary | ICD-10-CM | POA: Diagnosis not present

## 2017-02-17 DIAGNOSIS — Z79891 Long term (current) use of opiate analgesic: Secondary | ICD-10-CM | POA: Diagnosis not present

## 2017-02-17 DIAGNOSIS — D649 Anemia, unspecified: Secondary | ICD-10-CM | POA: Diagnosis not present

## 2017-02-17 DIAGNOSIS — Z7982 Long term (current) use of aspirin: Secondary | ICD-10-CM | POA: Diagnosis not present

## 2017-02-17 DIAGNOSIS — Z471 Aftercare following joint replacement surgery: Secondary | ICD-10-CM | POA: Diagnosis not present

## 2017-02-17 DIAGNOSIS — M6281 Muscle weakness (generalized): Secondary | ICD-10-CM | POA: Diagnosis not present

## 2017-02-21 DIAGNOSIS — M25562 Pain in left knee: Secondary | ICD-10-CM | POA: Diagnosis not present

## 2017-02-21 DIAGNOSIS — Z96652 Presence of left artificial knee joint: Secondary | ICD-10-CM | POA: Diagnosis not present

## 2017-02-21 DIAGNOSIS — Z471 Aftercare following joint replacement surgery: Secondary | ICD-10-CM | POA: Diagnosis not present

## 2017-02-22 DIAGNOSIS — Z7982 Long term (current) use of aspirin: Secondary | ICD-10-CM | POA: Diagnosis not present

## 2017-02-22 DIAGNOSIS — Z96652 Presence of left artificial knee joint: Secondary | ICD-10-CM | POA: Diagnosis not present

## 2017-02-22 DIAGNOSIS — M810 Age-related osteoporosis without current pathological fracture: Secondary | ICD-10-CM | POA: Diagnosis not present

## 2017-02-22 DIAGNOSIS — D649 Anemia, unspecified: Secondary | ICD-10-CM | POA: Diagnosis not present

## 2017-02-22 DIAGNOSIS — Z79891 Long term (current) use of opiate analgesic: Secondary | ICD-10-CM | POA: Diagnosis not present

## 2017-02-22 DIAGNOSIS — Z471 Aftercare following joint replacement surgery: Secondary | ICD-10-CM | POA: Diagnosis not present

## 2017-02-22 DIAGNOSIS — M6281 Muscle weakness (generalized): Secondary | ICD-10-CM | POA: Diagnosis not present

## 2017-02-23 DIAGNOSIS — D649 Anemia, unspecified: Secondary | ICD-10-CM | POA: Diagnosis not present

## 2017-02-23 DIAGNOSIS — Z96652 Presence of left artificial knee joint: Secondary | ICD-10-CM | POA: Diagnosis not present

## 2017-02-23 DIAGNOSIS — M810 Age-related osteoporosis without current pathological fracture: Secondary | ICD-10-CM | POA: Diagnosis not present

## 2017-02-23 DIAGNOSIS — M6281 Muscle weakness (generalized): Secondary | ICD-10-CM | POA: Diagnosis not present

## 2017-02-23 DIAGNOSIS — Z79891 Long term (current) use of opiate analgesic: Secondary | ICD-10-CM | POA: Diagnosis not present

## 2017-02-23 DIAGNOSIS — Z471 Aftercare following joint replacement surgery: Secondary | ICD-10-CM | POA: Diagnosis not present

## 2017-02-23 DIAGNOSIS — Z7982 Long term (current) use of aspirin: Secondary | ICD-10-CM | POA: Diagnosis not present

## 2017-02-24 DIAGNOSIS — Z7982 Long term (current) use of aspirin: Secondary | ICD-10-CM | POA: Diagnosis not present

## 2017-02-24 DIAGNOSIS — Z79891 Long term (current) use of opiate analgesic: Secondary | ICD-10-CM | POA: Diagnosis not present

## 2017-02-24 DIAGNOSIS — Z471 Aftercare following joint replacement surgery: Secondary | ICD-10-CM | POA: Diagnosis not present

## 2017-02-24 DIAGNOSIS — D649 Anemia, unspecified: Secondary | ICD-10-CM | POA: Diagnosis not present

## 2017-02-24 DIAGNOSIS — M810 Age-related osteoporosis without current pathological fracture: Secondary | ICD-10-CM | POA: Diagnosis not present

## 2017-02-24 DIAGNOSIS — Z96652 Presence of left artificial knee joint: Secondary | ICD-10-CM | POA: Diagnosis not present

## 2017-02-24 DIAGNOSIS — M6281 Muscle weakness (generalized): Secondary | ICD-10-CM | POA: Diagnosis not present

## 2017-02-28 DIAGNOSIS — M25562 Pain in left knee: Secondary | ICD-10-CM | POA: Diagnosis not present

## 2017-02-28 DIAGNOSIS — R262 Difficulty in walking, not elsewhere classified: Secondary | ICD-10-CM | POA: Diagnosis not present

## 2017-02-28 DIAGNOSIS — M25662 Stiffness of left knee, not elsewhere classified: Secondary | ICD-10-CM | POA: Diagnosis not present

## 2017-03-07 DIAGNOSIS — M25562 Pain in left knee: Secondary | ICD-10-CM | POA: Diagnosis not present

## 2017-03-07 DIAGNOSIS — M25662 Stiffness of left knee, not elsewhere classified: Secondary | ICD-10-CM | POA: Diagnosis not present

## 2017-03-07 DIAGNOSIS — R262 Difficulty in walking, not elsewhere classified: Secondary | ICD-10-CM | POA: Diagnosis not present

## 2017-03-09 DIAGNOSIS — M25562 Pain in left knee: Secondary | ICD-10-CM | POA: Diagnosis not present

## 2017-03-09 DIAGNOSIS — M25662 Stiffness of left knee, not elsewhere classified: Secondary | ICD-10-CM | POA: Diagnosis not present

## 2017-03-09 DIAGNOSIS — R262 Difficulty in walking, not elsewhere classified: Secondary | ICD-10-CM | POA: Diagnosis not present

## 2017-03-10 ENCOUNTER — Other Ambulatory Visit: Payer: Self-pay | Admitting: Family Medicine

## 2017-03-22 NOTE — Progress Notes (Addendum)
Subjective:   Tonya Johns is a 84 y.o. female who presents for Medicare Annual (Subsequent) preventive examination.  Review of Systems:  No ROS.  Medicare Wellness Visit. Additional risk factors are reflected in the social history.  Cardiac Risk Factors include: dyslipidemia;advanced age (>74men, >68 women);family history of premature cardiovascular disease;obesity (BMI >30kg/m2)   Sleep patterns: Issues with falling asleep and insomnia, up to void x 1.  Home Safety/Smoke Alarms: Feels safe in home. Smoke alarms in place.  Living environment; residence and Firearm Safety: Lives with son in 1 story home, steps at door with rail.  Seat Belt Safety/Bike Helmet: Wears seat belt.   Female:   Pap-2011       Mammo-09/24/2015,  BI-RADS CATEGORY  1: Negative. Will repeat in 08/2017  Dexa scan-09/12/2013, normal. Declines further testing.      CCS-Colonoscopy 01/31/2010, pt reported normal.      Objective:     Vitals: BP (!) 168/78 (BP Location: Left Arm, Cuff Size: Normal)   Pulse 66   Temp 98 F (36.7 C) (Temporal)   Resp 18   Ht 5\' 4"  (1.626 m)   Wt 178 lb 12.8 oz (81.1 kg)   SpO2 99%   BMI 30.69 kg/m   Body mass index is 30.69 kg/m.  Advanced Directives 03/23/2017 01/09/2017 01/02/2017 09/05/2014  Does Patient Have a Medical Advance Directive? Yes No No No  Type of Estate agent of [place];Living will - - -  Copy of Healthcare Power of Attorney in Chart? No - copy requested - - -  Would patient like information on creating a medical advance directive? - No - Patient declined Yes (MAU/Ambulatory/Procedural Areas - Information given) No - patient declined information    Tobacco Social History   Tobacco Use  Smoking Status Never Smoker  Smokeless Tobacco Never Used     Counseling given: Not Answered   Past Medical History:  Diagnosis Date  . Anemia   . Arthritis   . Diverticulitis   . GERD (gastroesophageal reflux disease)   . Hyperlipidemia   .  Hypothyroidism   . Leg swelling   . Osteoporosis    Past Surgical History:  Procedure Laterality Date  . EYE SURGERY Bilateral    cataract removal  . NO PAST SURGERIES    . TOTAL KNEE ARTHROPLASTY Left 01/09/2017   Procedure: TOTAL KNEE ARTHROPLASTY;  Surgeon: Gean Birchwood, MD;  Location: [facility] OR;  Service: Orthopedics;  Laterality: Left;   Family History  Problem Relation Age of Onset  . Alcohol abuse Brother   . Alcohol abuse Sister   . Arthritis Mother   . Stroke Mother   . Hypertension Mother   . Arthritis Father   . Heart disease Father   . Hyperlipidemia Brother   . Hyperlipidemia Sister   . Sudden death Brother   . Mental illness Sister   . Diabetes Sister   . Cancer Daughter        breast   Social History   Socioeconomic History  . Marital status: Single    Spouse name: None  . Number of children: 3  . Years of education: None  . Highest education level: None  Social Needs  . Financial resource strain: None  . Food insecurity - worry: None  . Food insecurity - inability: None  . Transportation needs - medical: None  . Transportation needs - non-medical: None  Occupational History  . Occupation: retired  Tobacco Use  . Smoking status: Never Smoker  .  Smokeless tobacco: Never Used  Substance and Sexual Activity  . Alcohol use: No  . Drug use: No  . Sexual activity: None  Other Topics Concern  . None  Social History Narrative  . None    Outpatient Encounter Medications as of 03/23/2017  Medication Sig  . Coenzyme Q10 (CO Q10) 100 MG CAPS Take 100 mg by mouth daily.    Marland Kitchen levothyroxine (SYNTHROID, LEVOTHROID) 100 MCG tablet TAKE 1 TABLET BY MOUTH  DAILY  . meloxicam (MOBIC) 15 MG tablet Take 15 mg by mouth daily as needed for pain.  . Multiple Vitamins-Minerals (MULTIVITAMIN PO) Take 1 tablet by mouth daily.  . pravastatin (PRAVACHOL) 80 MG tablet TAKE 1 TABLET BY MOUTH  DAILY  . tiZANidine (ZANAFLEX) 2 MG tablet Take 1 tablet (2 mg total) by mouth  every 6 (six) hours as needed for muscle spasms.  Marland Kitchen aspirin EC 325 MG tablet Take 1 tablet (325 mg total) by mouth 2 (two) times daily. (Patient not taking: Reported on 03/23/2017)  . [facility] COLD & HOT MEDICATED 5 % PTCH APPLY ONE PATCH TO RIGHT KNEE IN THE MORNING AND REMOVE AT NIGHT FOR 4 DAYS.  Marland Kitchen oxyCODONE-acetaminophen (ROXICET) 5-325 MG tablet Take 1 tablet by mouth every 4 (four) hours as needed. (Patient not taking: Reported on 03/23/2017)  . Zoster Vaccine Adjuvanted [facility]) injection Inject 0.5 mLs into the muscle once for 1 dose.   No facility-administered encounter medications on file as of 03/23/2017.     Activities of Daily Living In your present state of health, do you have any difficulty performing the following activities: 03/23/2017 01/09/2017  Hearing? N N  Vision? N N  Difficulty concentrating or making decisions? N N  Walking or climbing stairs? Y Y  Comment Difficulty upstairs d/t knee surgery. Continues PT exercises.  -  Dressing or bathing? N N  Doing errands, shopping? N Y  Quarry manager and eating ? N -  Using the Toilet? N -  In the past six months, have you accidently leaked urine? N -  Do you have problems with loss of bowel control? N -  Managing your Medications? N -  Managing your Finances? N -  Housekeeping or managing your Housekeeping? N -  Some recent data might be hidden    Patient Care Team: Sheliah Hatch, MD as PCP - General (Family Medicine) Romero Belling, MD as Consulting Physician (Endocrinology) Gean Birchwood, MD as Consulting Physician (Orthopedic Surgery)    Assessment:   This is a routine wellness examination for Tonya Johns.  Exercise Activities and Dietary recommendations Current Exercise Habits: Home exercise routine(Maintains household; PT exercises daily), Frequency (Times/Week): 7, Exercise limited by: None identified   Diet (meal preparation, eat out, water intake, caffeinated beverages, dairy products, fruits and vegetables):  Drinks water, OJ and coffee.   Breakfast: oatmeal Lunch: sandwich Dinner: protein and vegetables.   Decreased appetite, eats a few bites then full.   Goals      Patient Stated   . Stress managment (pt-stated)     Plans to achieve goal by stepping down from some of her church communities.        Other   . Patient Stated     Maintain current health by staying active.     . Weight < 200 lb (90.719 kg)     Desires to lose 5-10 lbs by next year.    Plans to look into Silver Sneakers.   Healthy meals and snacks to include lean protein,  fruits/veggies, whole grains, and limited sweets.         Fall Risk Fall Risk  03/23/2017 09/20/2016 03/10/2016 03/09/2015 09/05/2014  Falls in the past year? No No No No No    Depression Screen PHQ 2/9 Scores 03/23/2017 09/20/2016 03/10/2016 03/09/2015  PHQ - 2 Score 0 0 0 0  PHQ- 9 Score - 0 0 -  Exception Documentation - - - Patient refusal     Cognitive Function MMSE - Mini Mental State Exam 03/23/2017 09/05/2014  Orientation to time 5 5  Orientation to Place 5 5  Registration 3 3  Attention/ Calculation 4 5  Recall 3 2  Language- name 2 objects 2 2  Language- repeat 1 1  Language- follow 3 step command 3 3  Language- read & follow direction 1 1  Write a sentence 1 1  Copy design 0 1  Total score 28 29        Immunization History  Administered Date(s) Administered  . Influenza Split 11/25/2010, 11/16/2011  . Influenza,inj,Quad PF,6+ Mos 12/06/2012, 11/07/2013, 10/23/2014, 11/26/2015, 09/20/2016  . Pneumococcal Conjugate-13 02/19/2014  . Pneumococcal Polysaccharide-23 03/10/2016     Screening Tests Health Maintenance  Topic Date Due  . TETANUS/TDAP  03/23/2018 (Originally 05/15/1949)  . MAMMOGRAM  09/23/2017  . INFLUENZA VACCINE  Completed  . DEXA SCAN  Completed  . PNA vac Low Risk Adult  Completed        Plan:     Shingles vaccine at pharmacy.   Bring a copy of your living will and/or healthcare power of attorney to your  next office visit.  Continue doing brain stimulating activities (puzzles, reading, adult coloring books, staying active) to keep memory sharp.   I have personally reviewed and noted the following in the patient's chart:   . Medical and social history . Use of alcohol, tobacco or illicit drugs  . Current medications and supplements . Functional ability and status . Nutritional status . Physical activity . Advanced directives . List of other physicians . Hospitalizations, surgeries, and ER visits in previous 12 months . Vitals . Screenings to include cognitive, depression, and falls . Referrals and appointments  In addition, I have reviewed and discussed with patient certain preventive protocols, quality metrics, and best practice recommendations. A written personalized care plan for preventive services as well as general preventive health recommendations were provided to patient.     Alysia PennaKimberly R Zacari Radick, RN  03/23/2017  Reviewed documentation provided by RN and agree w/ above.  Neena RhymesKatherine Tabori, MD

## 2017-03-23 ENCOUNTER — Other Ambulatory Visit: Payer: Self-pay

## 2017-03-23 ENCOUNTER — Ambulatory Visit (INDEPENDENT_AMBULATORY_CARE_PROVIDER_SITE_OTHER): Payer: Medicare Other

## 2017-03-23 ENCOUNTER — Ambulatory Visit (INDEPENDENT_AMBULATORY_CARE_PROVIDER_SITE_OTHER): Payer: Medicare Other | Admitting: Family Medicine

## 2017-03-23 ENCOUNTER — Encounter: Payer: Self-pay | Admitting: Family Medicine

## 2017-03-23 ENCOUNTER — Encounter: Payer: Self-pay | Admitting: General Practice

## 2017-03-23 VITALS — BP 168/78 | HR 66 | Temp 98.0°F | Resp 18 | Ht 64.0 in | Wt 178.8 lb

## 2017-03-23 VITALS — BP 168/80 | HR 66 | Temp 98.0°F | Resp 18 | Ht 64.0 in | Wt 178.5 lb

## 2017-03-23 DIAGNOSIS — Z0000 Encounter for general adult medical examination without abnormal findings: Secondary | ICD-10-CM

## 2017-03-23 DIAGNOSIS — E785 Hyperlipidemia, unspecified: Secondary | ICD-10-CM | POA: Diagnosis not present

## 2017-03-23 DIAGNOSIS — E890 Postprocedural hypothyroidism: Secondary | ICD-10-CM

## 2017-03-23 DIAGNOSIS — Z23 Encounter for immunization: Secondary | ICD-10-CM

## 2017-03-23 LAB — HEPATIC FUNCTION PANEL
ALT: 10 U/L (ref 0–35)
AST: 15 U/L (ref 0–37)
Albumin: 3.8 g/dL (ref 3.5–5.2)
Alkaline Phosphatase: 74 U/L (ref 39–117)
BILIRUBIN DIRECT: 0.1 mg/dL (ref 0.0–0.3)
BILIRUBIN TOTAL: 0.4 mg/dL (ref 0.2–1.2)
TOTAL PROTEIN: 6.4 g/dL (ref 6.0–8.3)

## 2017-03-23 LAB — BASIC METABOLIC PANEL
BUN: 19 mg/dL (ref 6–23)
CALCIUM: 9.7 mg/dL (ref 8.4–10.5)
CO2: 31 mEq/L (ref 19–32)
Chloride: 104 mEq/L (ref 96–112)
Creatinine, Ser: 1.12 mg/dL (ref 0.40–1.20)
GFR: 59.48 mL/min — AB (ref >60.00)
GLUCOSE: 96 mg/dL (ref 70–99)
Potassium: 4.5 mEq/L (ref 3.5–5.1)
SODIUM: 139 meq/L (ref 135–145)

## 2017-03-23 LAB — CBC WITH DIFFERENTIAL/PLATELET
BASOS ABS: 0 10*3/uL (ref 0.0–0.1)
Basophils Relative: 0.6 % (ref 0.0–3.0)
Eosinophils Absolute: 0.1 10*3/uL (ref 0.0–0.7)
Eosinophils Relative: 1 % (ref 0.0–5.0)
HEMATOCRIT: 35 % — AB (ref 36.0–46.0)
Hemoglobin: 11.7 g/dL — ABNORMAL LOW (ref 12.0–15.0)
Lymphocytes Relative: 26 % (ref 12.0–46.0)
Lymphs Abs: 1.8 10*3/uL (ref 0.7–4.0)
MCHC: 33.5 g/dL (ref 30.0–36.0)
MCV: 89.9 fl (ref 78.0–100.0)
MONOS PCT: 6.9 % (ref 3.0–12.0)
Monocytes Absolute: 0.5 10*3/uL (ref 0.1–1.0)
NEUTROS ABS: 4.5 10*3/uL (ref 1.4–7.7)
Neutrophils Relative %: 65.5 % (ref 43.0–77.0)
PLATELETS: 214 10*3/uL (ref 150.0–400.0)
RBC: 3.9 Mil/uL (ref 3.87–5.11)
RDW: 13.9 % (ref 11.5–15.5)
WBC: 6.8 10*3/uL (ref 4.0–10.5)

## 2017-03-23 LAB — LIPID PANEL
CHOL/HDL RATIO: 4
Cholesterol: 186 mg/dL (ref 0–200)
HDL: 51 mg/dL (ref >39.00)
LDL Cholesterol: 99 mg/dL (ref 0–99)
NONHDL: 134.83
Triglycerides: 181 mg/dL — ABNORMAL HIGH (ref 0.0–149.0)
VLDL: 36.2 mg/dL (ref 0.0–40.0)

## 2017-03-23 LAB — TSH: TSH: 1.92 u[IU]/mL (ref 0.35–4.50)

## 2017-03-23 MED ORDER — ZOSTER VAC RECOMB ADJUVANTED 50 MCG/0.5ML IM SUSR: 0.5000 mL | Freq: Once | INTRAMUSCULAR | 1 refills | Status: AC

## 2017-03-23 NOTE — Patient Instructions (Signed)
Follow up in 6 months to recheck cholesterol We'll notify you of your lab results and make any changes if needed Keep up the good work!  You look great! Call with any questions or concerns Have a great weekend!!!

## 2017-03-23 NOTE — Patient Instructions (Addendum)
Shingles vaccine at pharmacy.   Bring a copy of your living will and/or healthcare power of attorney to your next office visit.  Continue doing brain stimulating activities (puzzles, reading, adult coloring books, staying active) to keep memory sharp.   Fall Prevention in the Home Falls can cause injuries. They can happen to people of all ages. There are many things you can do to make your home safe and to help prevent falls. What can I do on the outside of my home?  Regularly fix the edges of walkways and driveways and fix any cracks.  Remove anything that might make you trip as you walk through a door, such as a raised step or threshold.  Trim any bushes or trees on the path to your home.  Use bright outdoor lighting.  Clear any walking paths of anything that might make someone trip, such as rocks or tools.  Regularly check to see if handrails are loose or broken. Make sure that both sides of any steps have handrails.  Any raised decks and porches should have guardrails on the edges.  Have any leaves, snow, or ice cleared regularly.  Use sand or salt on walking paths during winter.  Clean up any spills in your garage right away. This includes oil or grease spills. What can I do in the bathroom?  Use night lights.  Install grab bars by the toilet and in the tub and shower. Do not use towel bars as grab bars.  Use non-skid mats or decals in the tub or shower.  If you need to sit down in the shower, use a plastic, non-slip stool.  Keep the floor dry. Clean up any water that spills on the floor as soon as it happens.  Remove soap buildup in the tub or shower regularly.  Attach bath mats securely with double-sided non-slip rug tape.  Do not have throw rugs and other things on the floor that can make you trip. What can I do in the bedroom?  Use night lights.  Make sure that you have a light by your bed that is easy to reach.  Do not use any sheets or blankets that are  too big for your bed. They should not hang down onto the floor.  Have a firm chair that has side arms. You can use this for support while you get dressed.  Do not have throw rugs and other things on the floor that can make you trip. What can I do in the kitchen?  Clean up any spills right away.  Avoid walking on wet floors.  Keep items that you use a lot in easy-to-reach places.  If you need to reach something above you, use a strong step stool that has a grab bar.  Keep electrical cords out of the way.  Do not use floor polish or wax that makes floors slippery. If you must use wax, use non-skid floor wax.  Do not have throw rugs and other things on the floor that can make you trip. What can I do with my stairs?  Do not leave any items on the stairs.  Make sure that there are handrails on both sides of the stairs and use them. Fix handrails that are broken or loose. Make sure that handrails are as long as the stairways.  Check any carpeting to make sure that it is firmly attached to the stairs. Fix any carpet that is loose or worn.  Avoid having throw rugs at the top or   or bottom of the stairs. If you do have throw rugs, attach them to the floor with carpet tape.  Make sure that you have a light switch at the top of the stairs and the bottom of the stairs. If you do not have them, ask someone to add them for you. What else can I do to help prevent falls?  Wear shoes that: ? Do not have high heels. ? Have rubber bottoms. ? Are comfortable and fit you well. ? Are closed at the toe. Do not wear sandals.  If you use a stepladder: ? Make sure that it is fully opened. Do not climb a closed stepladder. ? Make sure that both sides of the stepladder are locked into place. ? Ask someone to hold it for you, if possible.  Clearly mark and make sure that you can see: ? Any grab bars or handrails. ? First and last steps. ? Where the edge of each step is.  Use tools that help you  move around (mobility aids) if they are needed. These include: ? Canes. ? Walkers. ? Scooters. ? Crutches.  Turn on the lights when you go into a dark area. Replace any light bulbs as soon as they burn out.  Set up your furniture so you have a clear path. Avoid moving your furniture around.  If any of your floors are uneven, fix them.  If there are any pets around you, be aware of where they are.  Review your medicines with your doctor. Some medicines can make you feel dizzy. This can increase your chance of falling. Ask your doctor what other things that you can do to help prevent falls. This information is not intended to replace advice given to you by your health care provider. Make sure you discuss any questions you have with your health care provider. Document Released: 11/13/2008 Document Revised: 06/25/2015 Document Reviewed: 02/21/2014 Elsevier Interactive Patient Education  2018 [place] Maintenance, Female Adopting a healthy lifestyle and getting preventive care can go a long way to promote health and wellness. Talk with your health care provider about what schedule of regular examinations is right for you. This is a good chance for you to check in with your provider about disease prevention and staying healthy. In between checkups, there are plenty of things you can do on your own. Experts have done a lot of research about which lifestyle changes and preventive measures are most likely to keep you healthy. Ask your health care provider for more information. Weight and diet Eat a healthy diet  Be sure to include plenty of vegetables, fruits, low-fat dairy products, and lean protein.  Do not eat a lot of foods high in solid fats, added sugars, or salt.  Get regular exercise. This is one of the most important things you can do for your health. ? Most adults should exercise for at least 150 minutes each week. The exercise should increase your heart rate and make  you sweat (moderate-intensity exercise). ? Most adults should also do strengthening exercises at least twice a week. This is in addition to the moderate-intensity exercise.  Maintain a healthy weight  Body mass index (BMI) is a measurement that can be used to identify possible weight problems. It estimates body fat based on height and weight. Your health care provider can help determine your BMI and help you achieve or maintain a healthy weight.  For females 90 years of age and older: ? A BMI below 18.5 is  considered underweight. ? A BMI of 18.5 to 24.9 is normal. ? A BMI of 25 to 29.9 is considered overweight. ? A BMI of 30 and above is considered obese.  Watch levels of cholesterol and blood lipids  You should start having your blood tested for lipids and cholesterol at 82 years of age, then have this test every 5 years.  You may need to have your cholesterol levels checked more often if: ? Your lipid or cholesterol levels are high. ? You are older than 82 years of age. ? You are at high risk for heart disease.  Cancer screening Lung Cancer  Lung cancer screening is recommended for adults 32-16 years old who are at high risk for lung cancer because of a history of smoking.  A yearly low-dose CT scan of the lungs is recommended for people who: ? Currently smoke. ? Have quit within the past 15 years. ? Have at least a 30-pack-year history of smoking. A pack year is smoking an average of one pack of cigarettes a day for 1 year.  Yearly screening should continue until it has been 15 years since you quit.  Yearly screening should stop if you develop a health problem that would prevent you from having lung cancer treatment.  Breast Cancer  Practice breast self-awareness. This means understanding how your breasts normally appear and feel.  It also means doing regular breast self-exams. Let your health care provider know about any changes, no matter how small.  If you are in your  20s or 30s, you should have a clinical breast exam (CBE) by a health care provider every 1-3 years as part of a regular health exam.  If you are 25 or older, have a CBE every year. Also consider having a breast X-ray (mammogram) every year.  If you have a family history of breast cancer, talk to your health care provider about genetic screening.  If you are at high risk for breast cancer, talk to your health care provider about having an MRI and a mammogram every year.  Breast cancer gene (BRCA) assessment is recommended for women who have family members with BRCA-related cancers. BRCA-related cancers include: ? Breast. ? Ovarian. ? Tubal. ? Peritoneal cancers.  Results of the assessment will determine the need for genetic counseling and BRCA1 and BRCA2 testing.  Cervical Cancer Your health care provider may recommend that you be screened regularly for cancer of the pelvic organs (ovaries, uterus, and vagina). This screening involves a pelvic examination, including checking for microscopic changes to the surface of your cervix (Pap test). You may be encouraged to have this screening done every 3 years, beginning at age 50.  For women ages 9-65, health care providers may recommend pelvic exams and Pap testing every 3 years, or they may recommend the Pap and pelvic exam, combined with testing for human papilloma virus (HPV), every 5 years. Some types of HPV increase your risk of cervical cancer. Testing for HPV may also be done on women of any age with unclear Pap test results.  Other health care providers may not recommend any screening for nonpregnant women who are considered low risk for pelvic cancer and who do not have symptoms. Ask your health care provider if a screening pelvic exam is right for you.  If you have had past treatment for cervical cancer or a condition that could lead to cancer, you need Pap tests and screening for cancer for at least 20 years after your treatment. If Pap  tests have been discontinued, your risk factors (such as having a new sexual partner) need to be reassessed to determine if screening should resume. Some women have medical problems that increase the chance of getting cervical cancer. In these cases, your health care provider may recommend more frequent screening and Pap tests.  Colorectal Cancer  This type of cancer can be detected and often prevented.  Routine colorectal cancer screening usually begins at 82 years of age and continues through 82 years of age.  Your health care provider may recommend screening at an earlier age if you have risk factors for colon cancer.  Your health care provider may also recommend using home test kits to check for hidden blood in the stool.  A small camera at the end of a tube can be used to examine your colon directly (sigmoidoscopy or colonoscopy). This is done to check for the earliest forms of colorectal cancer.  Routine screening usually begins at age 65.  Direct examination of the colon should be repeated every 5-10 years through 82 years of age. However, you may need to be screened more often if early forms of precancerous polyps or small growths are found.  Skin Cancer  Check your skin from head to toe regularly.  Tell your health care provider about any new moles or changes in moles, especially if there is a change in a mole's shape or color.  Also tell your health care provider if you have a mole that is larger than the size of a pencil eraser.  Always use sunscreen. Apply sunscreen liberally and repeatedly throughout the day.  Protect yourself by wearing long sleeves, pants, a wide-brimmed hat, and sunglasses whenever you are outside.  Heart disease, diabetes, and high blood pressure  High blood pressure causes heart disease and increases the risk of stroke. High blood pressure is more likely to develop in: ? People who have blood pressure in the high end of the normal range  (130-139/85-89 mm Hg). ? People who are overweight or obese. ? People who are African American.  If you are 87-11 years of age, have your blood pressure checked every 3-5 years. If you are 85 years of age or older, have your blood pressure checked every year. You should have your blood pressure measured twice-once when you are at a hospital or clinic, and once when you are not at a hospital or clinic. Record the average of the two measurements. To check your blood pressure when you are not at a hospital or clinic, you can use: ? An automated blood pressure machine at a pharmacy. ? A home blood pressure monitor.  If you are between 74 years and 89 years old, ask your health care provider if you should take aspirin to prevent strokes.  Have regular diabetes screenings. This involves taking a blood sample to check your fasting blood sugar level. ? If you are at a normal weight and have a low risk for diabetes, have this test once every three years after 82 years of age. ? If you are overweight and have a high risk for diabetes, consider being tested at a younger age or more often. Preventing infection Hepatitis B  If you have a higher risk for hepatitis B, you should be screened for this virus. You are considered at high risk for hepatitis B if: ? You were born in a country where hepatitis B is common. Ask your health care provider which countries are considered high risk. ? Your parents were  born in a high-risk country, and you have not been immunized against hepatitis B (hepatitis B vaccine). ? You have HIV or AIDS. ? You use needles to inject street drugs. ? You live with someone who has hepatitis B. ? You have had sex with someone who has hepatitis B. ? You get hemodialysis treatment. ? You take certain medicines for conditions, including cancer, organ transplantation, and autoimmune conditions.  Hepatitis C  Blood testing is recommended for: ? Everyone born from [birth year] through  1963. ? Anyone with known risk factors for hepatitis C.  Sexually transmitted infections (STIs)  You should be screened for sexually transmitted infections (STIs) including gonorrhea and chlamydia if: ? You are sexually active and are younger than 82 years of age. ? You are older than 82 years of age and your health care provider tells you that you are at risk for this type of infection. ? Your sexual activity has changed since you were last screened and you are at an increased risk for chlamydia or gonorrhea. Ask your health care provider if you are at risk.  If you do not have HIV, but are at risk, it may be recommended that you take a prescription medicine daily to prevent HIV infection. This is called pre-exposure prophylaxis (PrEP). You are considered at risk if: ? You are sexually active and do not regularly use condoms or know the HIV status of your partner(s). ? You take drugs by injection. ? You are sexually active with a partner who has HIV.  Talk with your health care provider about whether you are at high risk of being infected with HIV. If you choose to begin PrEP, you should first be tested for HIV. You should then be tested every 3 months for as long as you are taking PrEP. Pregnancy  If you are premenopausal and you may become pregnant, ask your health care provider about preconception counseling.  If you may become pregnant, take 400 to 800 micrograms (mcg) of folic acid every day.  If you want to prevent pregnancy, talk to your health care provider about birth control (contraception). Osteoporosis and menopause  Osteoporosis is a disease in which the bones lose minerals and strength with aging. This can result in serious bone fractures. Your risk for osteoporosis can be identified using a bone density scan.  If you are 77 years of age or older, or if you are at risk for osteoporosis and fractures, ask your health care provider if you should be screened.  Ask your health  care provider whether you should take a calcium or vitamin D supplement to lower your risk for osteoporosis.  Menopause may have certain physical symptoms and risks.  Hormone replacement therapy may reduce some of these symptoms and risks. Talk to your health care provider about whether hormone replacement therapy is right for you. Follow these instructions at home:  Schedule regular health, dental, and eye exams.  Stay current with your immunizations.  Do not use any tobacco products including cigarettes, chewing tobacco, or electronic cigarettes.  If you are pregnant, do not drink alcohol.  If you are breastfeeding, limit how much and how often you drink alcohol.  Limit alcohol intake to no more than 1 drink per day for nonpregnant women. One drink equals 12 ounces of beer, 5 ounces of wine, or 1 ounces of hard liquor.  Do not use street drugs.  Do not share needles.  Ask your health care provider for help if you need support or  information about quitting drugs.  Tell your health care provider if you often feel depressed.  Tell your health care provider if you have ever been abused or do not feel safe at home. This information is not intended to replace advice given to you by your health care provider. Make sure you discuss any questions you have with your health care provider. Document Released: 08/02/2010 Document Revised: 06/25/2015 Document Reviewed: 10/21/2014 Elsevier Interactive Patient Education  [employer].

## 2017-03-23 NOTE — Assessment & Plan Note (Signed)
Chronic problem.  Following w/ Dr Everardo AllEllison.  Check labs and send him results for adjustment as needed

## 2017-03-23 NOTE — Addendum Note (Signed)
Addended by: Sheliah HatchABORI, Bron Snellings E on: 03/23/2017 10:16 AM   Modules accepted: Orders

## 2017-03-23 NOTE — Assessment & Plan Note (Signed)
Pt's PE WNL w/ exception of thyroid nodules.  UTD on colonoscopy, mammo, immunizations.  Check labs.  Anticipatory guidance provided.

## 2017-03-23 NOTE — Progress Notes (Signed)
   Subjective:    Patient ID: Tonya Johns, female    DOB: 1930/07/01, 84 y.o.   MRN: [id number]  HPI CPE- UTD on colonoscopy- no need for another.  Mammo is due in August.  UTD on immunizations.  Declines DEXA.     Review of Systems Patient reports no vision/ hearing changes, adenopathy,fever, weight change,  persistant/recurrent hoarseness , swallowing issues, chest pain, palpitations, edema, persistant/recurrent cough, hemoptysis, dyspnea (rest/exertional/paroxysmal nocturnal), gastrointestinal bleeding (melena, rectal bleeding), abdominal pain, significant heartburn, bowel changes, GU symptoms (dysuria, hematuria, incontinence), Gyn symptoms (abnormal  bleeding, pain),  syncope, focal weakness, memory loss, numbness & tingling, skin/hair/nail changes, abnormal bruising or bleeding, anxiety, or depression.     Objective:   Physical Exam General Appearance:    Alert, cooperative, no distress, appears stated age  Head:    Normocephalic, without obvious abnormality, atraumatic  Eyes:    PERRL, conjunctiva/corneas clear, EOM's intact, fundi    benign, both eyes  Ears:    Normal TM's and external ear canals, both ears  Nose:   Nares normal, septum midline, mucosa normal, no drainage    or sinus tenderness  Throat:   Lips, mucosa, and tongue normal; teeth and gums normal  Neck:   Supple, symmetrical, trachea midline, no adenopathy;    Thyroid: nodules  Back:     Symmetric, no curvature, ROM normal, no CVA tenderness  Lungs:     Clear to auscultation bilaterally, respirations unlabored  Chest Wall:    No tenderness or deformity   Heart:    Regular rate and rhythm, S1 and S2 normal, no murmur, rub   or gallop  Breast Exam:    Deferred to mammo  Abdomen:     Soft, non-tender, bowel sounds active all four quadrants,    no masses, no organomegaly  Genitalia:    Deferred  Rectal:    Extremities:   Extremities normal, atraumatic, no cyanosis or edema  Pulses:   2+ and symmetric all extremities    Skin:   Skin color, texture, turgor normal, no rashes or lesions  Lymph nodes:   Cervical, supraclavicular, and axillary nodes normal  Neurologic:   CNII-XII intact, normal strength, sensation and reflexes    throughout          Assessment & Plan:

## 2017-03-23 NOTE — Assessment & Plan Note (Signed)
Chronic problem.  Tolerating Pravastatin w/o difficulty.  Check labs.  Adjust meds prn  

## 2017-04-20 DIAGNOSIS — Z96652 Presence of left artificial knee joint: Secondary | ICD-10-CM | POA: Diagnosis not present

## 2017-04-20 DIAGNOSIS — Z471 Aftercare following joint replacement surgery: Secondary | ICD-10-CM | POA: Diagnosis not present

## 2017-04-20 DIAGNOSIS — M25562 Pain in left knee: Secondary | ICD-10-CM | POA: Diagnosis not present

## 2017-07-12 DIAGNOSIS — M25462 Effusion, left knee: Secondary | ICD-10-CM | POA: Diagnosis not present

## 2017-07-12 DIAGNOSIS — M25461 Effusion, right knee: Secondary | ICD-10-CM | POA: Diagnosis not present

## 2017-09-12 ENCOUNTER — Ambulatory Visit: Payer: Medicare Other | Admitting: Family Medicine

## 2017-09-12 ENCOUNTER — Encounter: Payer: Self-pay | Admitting: Family Medicine

## 2017-09-12 ENCOUNTER — Other Ambulatory Visit: Payer: Self-pay

## 2017-09-12 VITALS — BP 132/76 | HR 76 | Temp 98.0°F | Resp 16 | Ht 64.0 in | Wt 182.4 lb

## 2017-09-12 DIAGNOSIS — E785 Hyperlipidemia, unspecified: Secondary | ICD-10-CM

## 2017-09-12 DIAGNOSIS — M25512 Pain in left shoulder: Secondary | ICD-10-CM

## 2017-09-12 DIAGNOSIS — G8929 Other chronic pain: Secondary | ICD-10-CM

## 2017-09-12 DIAGNOSIS — E890 Postprocedural hypothyroidism: Secondary | ICD-10-CM | POA: Diagnosis not present

## 2017-09-12 LAB — LIPID PANEL
CHOLESTEROL: 266 mg/dL — AB (ref 0–200)
HDL: 59.1 mg/dL (ref >39.00)
LDL CALC: 181 mg/dL — AB (ref 0–99)
NonHDL: 207.06
TRIGLYCERIDES: 131 mg/dL (ref 0.0–149.0)
Total CHOL/HDL Ratio: 5
VLDL: 26.2 mg/dL (ref 0.0–40.0)

## 2017-09-12 LAB — BASIC METABOLIC PANEL
BUN: 16 mg/dL (ref 6–23)
CALCIUM: 9.9 mg/dL (ref 8.4–10.5)
CO2: 31 mEq/L (ref 19–32)
CREATININE: 1.28 mg/dL — AB (ref 0.40–1.20)
Chloride: 102 mEq/L (ref 96–112)
GFR: 50.93 mL/min — ABNORMAL LOW (ref >60.00)
GLUCOSE: 94 mg/dL (ref 70–99)
Potassium: 4.1 mEq/L (ref 3.5–5.1)
Sodium: 140 mEq/L (ref 135–145)

## 2017-09-12 LAB — CBC WITH DIFFERENTIAL/PLATELET
BASOS ABS: 0.1 10*3/uL (ref 0.0–0.1)
Basophils Relative: 1.2 % (ref 0.0–3.0)
Eosinophils Absolute: 0.1 10*3/uL (ref 0.0–0.7)
Eosinophils Relative: 2 % (ref 0.0–5.0)
HEMATOCRIT: 35.8 % — AB (ref 36.0–46.0)
Hemoglobin: 12.1 g/dL (ref 12.0–15.0)
LYMPHS ABS: 1.6 10*3/uL (ref 0.7–4.0)
LYMPHS PCT: 26.8 % (ref 12.0–46.0)
MCHC: 33.9 g/dL (ref 30.0–36.0)
MCV: 91.5 fl (ref 78.0–100.0)
MONOS PCT: 6.3 % (ref 3.0–12.0)
Monocytes Absolute: 0.4 10*3/uL (ref 0.1–1.0)
NEUTROS PCT: 63.7 % (ref 43.0–77.0)
Neutro Abs: 3.8 10*3/uL (ref 1.4–7.7)
Platelets: 202 10*3/uL (ref 150.0–400.0)
RBC: 3.91 Mil/uL (ref 3.87–5.11)
RDW: 13.5 % (ref 11.5–15.5)
WBC: 6 10*3/uL (ref 4.0–10.5)

## 2017-09-12 LAB — TSH: TSH: 63.69 u[IU]/mL — ABNORMAL HIGH (ref 0.35–4.50)

## 2017-09-12 LAB — HEPATIC FUNCTION PANEL
ALBUMIN: 4 g/dL (ref 3.5–5.2)
ALT: 9 U/L (ref 0–35)
AST: 15 U/L (ref 0–37)
Alkaline Phosphatase: 79 U/L (ref 39–117)
Bilirubin, Direct: 0.1 mg/dL (ref 0.0–0.3)
TOTAL PROTEIN: 6.6 g/dL (ref 6.0–8.3)
Total Bilirubin: 0.5 mg/dL (ref 0.2–1.2)

## 2017-09-12 NOTE — Assessment & Plan Note (Signed)
Chronic problem.  Due for repeat labs.  Currently asymptomatic.  Will adjust meds based on lab results if needed.

## 2017-09-12 NOTE — Progress Notes (Signed)
   Subjective:    Patient ID: Tonya ManlyElma Johns, female    DOB: [birth year], 84 y.o.   MRN: [id number]  HPI Hyperlipidemia- chronic problem, on Pravastatin 80mg .  No CP, SOB, HAs, visual changes, abd pain, N/V.  Hypothyroid- chronic problem, on Levothyroxine 100mcg.  Pt reports energy level is 'pretty good'.  No changes to skin/hair/nails.  L shoulder pain- pt reports 'it's been bugging me a lot'.  sxs started a few months ago.  Has been using OTC pain rubs w/ some relief.  Has to wear a sweater b/c cold air worsens her pain.  Pt has seen Dr Althea CharonMcKinley in the past.   Review of Systems For ROS see HPI     Objective:   Physical Exam  Constitutional: She is oriented to person, place, and time. She appears well-developed and well-nourished. No distress.  HENT:  Head: Normocephalic and atraumatic.  Eyes: Pupils are equal, round, and reactive to light. Conjunctivae and EOM are normal.  Neck: Normal range of motion. Neck supple. Thyromegaly (multinodular goiter) present.  Cardiovascular: Normal rate, regular rhythm, normal heart sounds and intact distal pulses.  Pulmonary/Chest: Effort normal and breath sounds normal. No respiratory distress.  Abdominal: Soft. She exhibits no distension. There is no tenderness.  Musculoskeletal: She exhibits no edema.  Lymphadenopathy:    She has no cervical adenopathy.  Neurological: She is alert and oriented to person, place, and time.  Skin: Skin is warm and dry.  Psychiatric: She has a normal mood and affect. Her behavior is normal.  Vitals reviewed.         Assessment & Plan:  L shoulder pain- new to provider, ongoing for pt.  She feels she 'just needs a cortisone injection'  I advised her that prior to that, we want to make sure she has an accurate diagnosis.  She has seen Dr Althea CharonMcKinley in the past and prefers to see him again.  Referral placed.

## 2017-09-12 NOTE — Patient Instructions (Addendum)
Schedule your complete physical in 6 months We'll notify you of your lab results and make any changes if needed We'll call you with your Ortho appt to evaluate and treat your shoulder pain Continue to make healthy food choices and exercise as you are able Call with any questions or concerns Enjoy the rest of your summer!!!

## 2017-09-12 NOTE — Assessment & Plan Note (Signed)
Chronic problem, on Pravastatin 80mg  daily w/o difficulty.  Asymptomatic.  Check labs.  Adjust meds prn

## 2017-09-29 DIAGNOSIS — M25512 Pain in left shoulder: Secondary | ICD-10-CM | POA: Diagnosis not present

## 2017-10-16 ENCOUNTER — Encounter: Payer: Self-pay | Admitting: Endocrinology

## 2017-10-16 ENCOUNTER — Ambulatory Visit: Payer: Medicare Other | Admitting: Endocrinology

## 2017-10-16 VITALS — BP 120/70 | HR 73 | Ht 64.0 in | Wt 184.0 lb

## 2017-10-16 DIAGNOSIS — E890 Postprocedural hypothyroidism: Secondary | ICD-10-CM | POA: Diagnosis not present

## 2017-10-16 LAB — TSH: TSH: 1.42 u[IU]/mL (ref 0.35–4.50)

## 2017-10-16 LAB — T4, FREE: FREE T4: 1.49 ng/dL (ref 0.60–1.60)

## 2017-10-16 NOTE — Patient Instructions (Signed)
blood tests are requested for you today.  We'll let you know about the results.  Please come back for a follow-up appointment in 1 year.   most of the time, a "lumpy thyroid" will eventually become overactive again.  When this happens, we would first notice that you would need less thyroid medication.   

## 2017-10-16 NOTE — Progress Notes (Signed)
Subjective:    Patient ID: Tonya Johns, female    DOB: July 12, 1930, 84 y.o.   MRN: [id number]  HPI Pt returns for f/u of post-RAI hypothyroidism (in 2012, she had RAI, for hyperthyroidism due to multinodular goiter; she started on synthroid in 2013; [place] in 2016 was only minimally changed; she declines any more [place]).  pt states she was off synthroid x a few months when she saw Dr Tonya Johns last month.  Since back on it pt states she feels better in general.  Specifically, cold intolerance is much better.  She does not notice the goiter.   Past Medical History:  Diagnosis Date  . Anemia   . Arthritis   . Diverticulitis   . GERD (gastroesophageal reflux disease)   . Hyperlipidemia   . Hypothyroidism   . Leg swelling   . Osteoporosis     Past Surgical History:  Procedure Laterality Date  . EYE SURGERY Bilateral    cataract removal  . NO PAST SURGERIES    . TOTAL KNEE ARTHROPLASTY Left 01/09/2017   Procedure: TOTAL KNEE ARTHROPLASTY;  Surgeon: Tonya Birchwood, MD;  Location: [facility] OR;  Service: Orthopedics;  Laterality: Left;    Social History   Socioeconomic History  . Marital status: Single    Spouse name: Not on file  . Number of children: 3  . Years of education: Not on file  . Highest education level: Not on file  Occupational History  . Occupation: retired  Engineer, production  . Financial resource strain: Not on file  . Food insecurity:    Worry: Not on file    Inability: Not on file  . Transportation needs:    Medical: Not on file    Non-medical: Not on file  Tobacco Use  . Smoking status: Never Smoker  . Smokeless tobacco: Never Used  Substance and Sexual Activity  . Alcohol use: No  . Drug use: No  . Sexual activity: Not on file  Lifestyle  . Physical activity:    Days per week: Not on file    Minutes per session: Not on file  . Stress: Not on file  Relationships  . Social connections:    Talks on phone: Not on file    Gets together: Not on file    Attends religious  service: Not on file    Active member of club or organization: Not on file    Attends meetings of clubs or organizations: Not on file    Relationship status: Not on file  . Intimate partner violence:    Fear of current or ex partner: Not on file    Emotionally abused: Not on file    Physically abused: Not on file    Forced sexual activity: Not on file  Other Topics Concern  . Not on file  Social History Narrative  . Not on file    Current Outpatient Medications on File Prior to Visit  Medication Sig Dispense Refill  . Coenzyme Q10 (CO Q10) 100 MG CAPS Take 100 mg by mouth daily.      Marland Kitchen levothyroxine (SYNTHROID, LEVOTHROID) 100 MCG tablet TAKE 1 TABLET BY MOUTH  DAILY 90 tablet 1  . Multiple Vitamins-Minerals (MULTIVITAMIN PO) Take 1 tablet by mouth daily.    . pravastatin (PRAVACHOL) 80 MG tablet TAKE 1 TABLET BY MOUTH  DAILY 90 tablet 1   No current facility-administered medications on file prior to visit.     Allergies  Allergen Reactions  . Penicillins Rash and  Other (See Comments)    PATIENT HAS HAD A PCN REACTION WITH IMMEDIATE RASH, FACIAL/TONGUE/THROAT SWELLING, SOB, OR LIGHTHEADEDNESS WITH HYPOTENSION:  #  #  #  YES  #  #  #   Has patient had a PCN reaction causing severe rash involving mucus membranes or skin necrosis: No Has patient had a PCN reaction that required hospitalization: No PATIENT HAS HAD A PCN REACTION THAT REQUIRED HOSPITALIZATION:  #  #  #  YES  #  #  #      Family History  Problem Relation Age of Onset  . Alcohol abuse Brother   . Alcohol abuse Sister   . Arthritis Mother   . Stroke Mother   . Hypertension Mother   . Arthritis Father   . Heart disease Father   . Hyperlipidemia Brother   . Hyperlipidemia Sister   . Sudden death Brother   . Mental illness Sister   . Diabetes Sister   . Cancer Daughter        breast    BP 120/70 (BP Location: Left Arm, Patient Position: Sitting)   Pulse 73   Ht 5\' 4"  (1.626 m)   Wt 184 lb (83.5 kg)    SpO2 97%   BMI 31.58 kg/m    Review of Systems She has lost a few lbs.     Objective:   Physical Exam VITAL SIGNS:  See vs page.  GENERAL: no distress.  Neck: thyroid is slightly enlarged, with multinodular surface, (R>L).    Lab Results  Component Value Date   TSH 1.42 10/16/2017      Assessment & Plan:  Hypothyroidism: better back on rx Goiter: we discussed.  she declines US.  Patient Instructions  blood tests are requested for you today.  We'll let you know about the results.  Please come back for a follow-up appointment in 1 year.   most of the time, a "lumpy thyroid" will eventually become overactive again.  When this happens, we would first notice that you would need less thyroid medication.

## 2018-01-18 ENCOUNTER — Other Ambulatory Visit: Payer: Self-pay | Admitting: Family Medicine

## 2018-02-26 ENCOUNTER — Ambulatory Visit: Payer: Self-pay

## 2018-02-26 NOTE — Telephone Encounter (Signed)
fyi

## 2018-02-26 NOTE — Telephone Encounter (Signed)
Pt. Reports she started having dizziness last weekend. Dizziness is on and off. Usually when changing positions. Some nausea. States she has "not had much of an appetite recently." No other symptoms. Will only see Dr. Beverely Low. Appointment made for Wednesday.  Reason for Disposition . [1] MODERATE dizziness (e.g., interferes with normal activities) AND [2] has NOT been evaluated by physician for this  (Exception: dizziness caused by heat exposure, sudden standing, or poor fluid intake)  Answer Assessment - Initial Assessment Questions 1. DESCRIPTION: "Describe your dizziness."     Lightheaded 2. LIGHTHEADED: "Do you feel lightheaded?" (e.g., somewhat faint, woozy, weak upon standing)     Woozy 3. VERTIGO: "Do you feel like either you or the room is spinning or tilting?" (i.e. vertigo)     No 4. SEVERITY: "How bad is it?"  "Do you feel like you are going to faint?" "Can you stand and walk?"   - MILD - walking normally   - MODERATE - interferes with normal activities (e.g., work, school)    - SEVERE - unable to stand, requires support to walk, feels like passing out now.      Moderate 5. ONSET:  "When did the dizziness begin?"     Last week ago 6. AGGRAVATING FACTORS: "Does anything make it worse?" (e.g., standing, change in head position)     Changing positions 7. HEART RATE: "Can you tell me your heart rate?" "How many beats in 15 seconds?"  (Note: not all patients can do this)       No 8. CAUSE: "What do you think is causing the dizziness?"     Unsure 9. RECURRENT SYMPTOM: "Have you had dizziness before?" If so, ask: "When was the last time?" "What happened that time?"     No 10. OTHER SYMPTOMS: "Do you have any other symptoms?" (e.g., fever, chest pain, vomiting, diarrhea, bleeding)       No 11. PREGNANCY: "Is there any chance you are pregnant?" "When was your last menstrual period?"       n/a  Protocols used: DIZZINESS [facility]

## 2018-02-26 NOTE — Telephone Encounter (Signed)
Called and spoke with pt, she advised that she has been having increased dizziness today. Per PCP pt needed to be seen sooner. She was moved to tomorrow at 11:30am. advised she would go to ER if symptoms worsen.

## 2018-02-26 NOTE — Telephone Encounter (Signed)
Please advise pt that if sxs change or worsen, I want her to be seen tomorrow rather than waiting until Wednesday

## 2018-02-27 ENCOUNTER — Encounter: Payer: Self-pay | Admitting: Family Medicine

## 2018-02-27 ENCOUNTER — Ambulatory Visit (INDEPENDENT_AMBULATORY_CARE_PROVIDER_SITE_OTHER): Payer: Medicare Other | Admitting: Family Medicine

## 2018-02-27 ENCOUNTER — Other Ambulatory Visit: Payer: Self-pay

## 2018-02-27 VITALS — BP 124/78 | HR 78 | Temp 98.2°F | Resp 16 | Ht 64.0 in | Wt 185.5 lb

## 2018-02-27 DIAGNOSIS — R630 Anorexia: Secondary | ICD-10-CM

## 2018-02-27 DIAGNOSIS — R42 Dizziness and giddiness: Secondary | ICD-10-CM | POA: Diagnosis not present

## 2018-02-27 DIAGNOSIS — I951 Orthostatic hypotension: Secondary | ICD-10-CM | POA: Diagnosis not present

## 2018-02-27 LAB — CBC WITH DIFFERENTIAL/PLATELET
BASOS ABS: 0.1 10*3/uL (ref 0.0–0.1)
Basophils Relative: 0.9 % (ref 0.0–3.0)
EOS PCT: 1 % (ref 0.0–5.0)
Eosinophils Absolute: 0.1 10*3/uL (ref 0.0–0.7)
HCT: 37 % (ref 36.0–46.0)
Hemoglobin: 12.5 g/dL (ref 12.0–15.0)
LYMPHS ABS: 1.8 10*3/uL (ref 0.7–4.0)
Lymphocytes Relative: 23.4 % (ref 12.0–46.0)
MCHC: 33.7 g/dL (ref 30.0–36.0)
MCV: 90.6 fl (ref 78.0–100.0)
MONOS PCT: 6 % (ref 3.0–12.0)
Monocytes Absolute: 0.5 10*3/uL (ref 0.1–1.0)
NEUTROS ABS: 5.3 10*3/uL (ref 1.4–7.7)
NEUTROS PCT: 68.7 % (ref 43.0–77.0)
PLATELETS: 219 10*3/uL (ref 150.0–400.0)
RBC: 4.09 Mil/uL (ref 3.87–5.11)
RDW: 13.4 % (ref 11.5–15.5)
WBC: 7.7 10*3/uL (ref 4.0–10.5)

## 2018-02-27 LAB — HEPATIC FUNCTION PANEL
ALK PHOS: 90 U/L (ref 39–117)
ALT: 11 U/L (ref 0–35)
AST: 15 U/L (ref 0–37)
Albumin: 4.1 g/dL (ref 3.5–5.2)
BILIRUBIN DIRECT: 0.1 mg/dL (ref 0.0–0.3)
BILIRUBIN TOTAL: 0.4 mg/dL (ref 0.2–1.2)
Total Protein: 6.6 g/dL (ref 6.0–8.3)

## 2018-02-27 LAB — BASIC METABOLIC PANEL
BUN: 17 mg/dL (ref 6–23)
CO2: 32 mEq/L (ref 19–32)
Calcium: 10.4 mg/dL (ref 8.4–10.5)
Chloride: 99 mEq/L (ref 96–112)
Creatinine, Ser: 1.21 mg/dL — ABNORMAL HIGH (ref 0.40–1.20)
GFR: 51.07 mL/min — AB (ref >60.00)
GLUCOSE: 100 mg/dL — AB (ref 70–99)
POTASSIUM: 4.4 meq/L (ref 3.5–5.1)
Sodium: 138 mEq/L (ref 135–145)

## 2018-02-27 LAB — TSH: TSH: 8.98 u[IU]/mL — AB (ref 0.35–4.50)

## 2018-02-27 MED ORDER — MECLIZINE HCL 12.5 MG PO TABS: 12.5000 mg | ORAL_TABLET | Freq: Three times a day (TID) | ORAL | 0 refills | Status: DC | PRN

## 2018-02-27 NOTE — Patient Instructions (Signed)
Follow up as needed or as scheduled We'll notify you of your lab results and make any changes if needed Drink LOTS of water Change positions slowly Try and eat smaller amounts more frequently throughout the day- graze Call with any questions or concerns Hang in there!!!

## 2018-02-27 NOTE — Progress Notes (Signed)
   Subjective:    Patient ID: Tonya Johns, female    DOB: 09-04-1930, 85 y.o.   MRN: [id number]  HPI Dizziness- sxs started ~1 week when getting up.  She got nauseous, felt like she was spinning.  sxs eased when she was able to sit down.  Pt has not been eating regularly and drinking is limited, 'I just don't want nothing'.  Pt reports the taste is 'wrong'.  'I just don't be hungry'.  Dizziness will come and go.  Had dizziness when getting into bed 'a couple of time'.   Review of Systems For ROS see HPI     Objective:   Physical Exam Vitals signs reviewed.  Constitutional:      General: She is not in acute distress.    Appearance: Normal appearance. She is well-developed.  HENT:     Head: Normocephalic and atraumatic.     Right Ear: Tympanic membrane and ear canal normal.     Left Ear: Tympanic membrane and ear canal normal.     Nose: No congestion.     Mouth/Throat:     Mouth: Mucous membranes are moist.  Eyes:     Extraocular Movements: Extraocular movements intact.     Conjunctiva/sclera: Conjunctivae normal.     Pupils: Pupils are equal, round, and reactive to light.  Neck:     Musculoskeletal: Normal range of motion and neck supple.     Thyroid: No thyromegaly.  Cardiovascular:     Rate and Rhythm: Normal rate and regular rhythm.     Heart sounds: Normal heart sounds. No murmur.  Pulmonary:     Effort: Pulmonary effort is normal. No respiratory distress.     Breath sounds: Normal breath sounds.  Abdominal:     General: There is no distension.     Palpations: Abdomen is soft.     Tenderness: There is no abdominal tenderness.  Lymphadenopathy:     Cervical: No cervical adenopathy.  Skin:    General: Skin is warm and dry.  Neurological:     Mental Status: She is alert and oriented to person, place, and time.     Cranial Nerves: No cranial nerve deficit.     Motor: No weakness.     Coordination: Coordination normal.     Gait: Gait normal.  Psychiatric:      Behavior: Behavior normal.           Assessment & Plan:  Orthostatic hypotension- reviewed dx w/ pt and explained the need for increased water intake and for her to change positions slowly.  Pt expressed understanding and is in agreement w/ plan.   Vertigo- her spinning sensation when getting in and out of bed is separate from her orthostatic hypotension.  Again reviewed dx and differences.  Pt to use Meclizine prn.  Pt expressed understanding and is in agreement w/ plan.   Decreased appetite- this may be a function of normal aging but will check labs to r/o metabolic cause.  Encouraged her to eat smaller meals more frequently throughout the day rather than eating 3 large meals daily.  Will follow.

## 2018-02-28 ENCOUNTER — Other Ambulatory Visit: Payer: Self-pay | Admitting: General Practice

## 2018-02-28 ENCOUNTER — Ambulatory Visit: Payer: Medicare Other | Admitting: Family Medicine

## 2018-02-28 MED ORDER — LEVOTHYROXINE SODIUM 112 MCG PO TABS: 112.0000 ug | ORAL_TABLET | Freq: Every day | ORAL | 1 refills | Status: DC

## 2018-03-22 ENCOUNTER — Other Ambulatory Visit: Payer: Self-pay | Admitting: Family Medicine

## 2018-03-22 ENCOUNTER — Encounter: Payer: Self-pay | Admitting: Family Medicine

## 2018-03-23 ENCOUNTER — Emergency Department (HOSPITAL_BASED_OUTPATIENT_CLINIC_OR_DEPARTMENT_OTHER): Payer: Medicare Other

## 2018-03-23 ENCOUNTER — Observation Stay (HOSPITAL_BASED_OUTPATIENT_CLINIC_OR_DEPARTMENT_OTHER)
Admission: EM | Admit: 2018-03-23 | Discharge: 2018-03-25 | Disposition: A | Discharge status: Home / Self Care | Payer: Medicare Other | Attending: Internal Medicine | Admitting: Internal Medicine

## 2018-03-23 ENCOUNTER — Encounter (HOSPITAL_BASED_OUTPATIENT_CLINIC_OR_DEPARTMENT_OTHER): Payer: Self-pay | Admitting: *Deleted

## 2018-03-23 ENCOUNTER — Other Ambulatory Visit: Payer: Self-pay

## 2018-03-23 ENCOUNTER — Ambulatory Visit: Payer: Medicare Other | Admitting: Family Medicine

## 2018-03-23 DIAGNOSIS — R001 Bradycardia, unspecified: Secondary | ICD-10-CM

## 2018-03-23 DIAGNOSIS — I4581 Long QT syndrome: Secondary | ICD-10-CM | POA: Diagnosis not present

## 2018-03-23 DIAGNOSIS — R030 Elevated blood-pressure reading, without diagnosis of hypertension: Secondary | ICD-10-CM | POA: Diagnosis not present

## 2018-03-23 DIAGNOSIS — R2689 Other abnormalities of gait and mobility: Secondary | ICD-10-CM | POA: Insufficient documentation

## 2018-03-23 DIAGNOSIS — Z96652 Presence of left artificial knee joint: Secondary | ICD-10-CM | POA: Diagnosis not present

## 2018-03-23 DIAGNOSIS — E039 Hypothyroidism, unspecified: Secondary | ICD-10-CM | POA: Diagnosis not present

## 2018-03-23 DIAGNOSIS — R9431 Abnormal electrocardiogram [ECG] [EKG]: Secondary | ICD-10-CM

## 2018-03-23 DIAGNOSIS — R42 Dizziness and giddiness: Secondary | ICD-10-CM | POA: Diagnosis not present

## 2018-03-23 DIAGNOSIS — R2681 Unsteadiness on feet: Secondary | ICD-10-CM

## 2018-03-23 DIAGNOSIS — K5289 Other specified noninfective gastroenteritis and colitis: Secondary | ICD-10-CM | POA: Diagnosis not present

## 2018-03-23 DIAGNOSIS — R51 Headache: Secondary | ICD-10-CM | POA: Diagnosis not present

## 2018-03-23 DIAGNOSIS — R112 Nausea with vomiting, unspecified: Secondary | ICD-10-CM | POA: Diagnosis not present

## 2018-03-23 DIAGNOSIS — E860 Dehydration: Secondary | ICD-10-CM | POA: Diagnosis not present

## 2018-03-23 DIAGNOSIS — R778 Other specified abnormalities of plasma proteins: Secondary | ICD-10-CM | POA: Diagnosis present

## 2018-03-23 DIAGNOSIS — R7989 Other specified abnormal findings of blood chemistry: Secondary | ICD-10-CM | POA: Diagnosis not present

## 2018-03-23 DIAGNOSIS — A084 Viral intestinal infection, unspecified: Secondary | ICD-10-CM | POA: Diagnosis present

## 2018-03-23 LAB — URINALYSIS, ROUTINE W REFLEX MICROSCOPIC
Bilirubin Urine: NEGATIVE
Glucose, UA: NEGATIVE mg/dL
Hgb urine dipstick: NEGATIVE
Ketones, ur: NEGATIVE mg/dL
Nitrite: NEGATIVE
PH: 7.5 (ref 5.0–8.0)
Protein, ur: NEGATIVE mg/dL
Specific Gravity, Urine: 1.01 (ref 1.005–1.030)

## 2018-03-23 LAB — CBC WITH DIFFERENTIAL/PLATELET
Abs Immature Granulocytes: 0.02 10*3/uL (ref 0.00–0.07)
BASOS ABS: 0 10*3/uL (ref 0.0–0.1)
BASOS PCT: 0 %
Eosinophils Absolute: 0.1 10*3/uL (ref 0.0–0.5)
Eosinophils Relative: 1 %
HCT: 38.6 % (ref 36.0–46.0)
Hemoglobin: 12.5 g/dL (ref 12.0–15.0)
Immature Granulocytes: 0 %
Lymphocytes Relative: 25 %
Lymphs Abs: 2 10*3/uL (ref 0.7–4.0)
MCH: 29.9 pg (ref 26.0–34.0)
MCHC: 32.4 g/dL (ref 30.0–36.0)
MCV: 92.3 fL (ref 80.0–100.0)
MONO ABS: 0.5 10*3/uL (ref 0.1–1.0)
Monocytes Relative: 6 %
NEUTROS ABS: 5.3 10*3/uL (ref 1.7–7.7)
NEUTROS PCT: 68 %
NRBC: 0 % (ref 0.0–0.2)
PLATELETS: 202 10*3/uL (ref 150–400)
RBC: 4.18 MIL/uL (ref 3.87–5.11)
RDW: 12.6 % (ref 11.5–15.5)
WBC: 7.9 10*3/uL (ref 4.0–10.5)

## 2018-03-23 LAB — COMPREHENSIVE METABOLIC PANEL
ALT: 14 U/L (ref 0–44)
AST: 19 U/L (ref 15–41)
Albumin: 3.2 g/dL — ABNORMAL LOW (ref 3.5–5.0)
Alkaline Phosphatase: 79 U/L (ref 38–126)
Anion gap: 9 (ref 5–15)
BUN: 17 mg/dL (ref 8–23)
CO2: 25 mmol/L (ref 22–32)
CREATININE: 1.07 mg/dL — AB (ref 0.44–1.00)
Calcium: 9.4 mg/dL (ref 8.9–10.3)
Chloride: 106 mmol/L (ref 98–111)
GFR calc Af Amer: 55 mL/min — ABNORMAL LOW (ref >60)
GFR calc non Af Amer: 47 mL/min — ABNORMAL LOW (ref >60)
Glucose, Bld: 95 mg/dL (ref 70–99)
Potassium: 3.9 mmol/L (ref 3.5–5.1)
Sodium: 140 mmol/L (ref 135–145)
Total Bilirubin: 0.5 mg/dL (ref 0.3–1.2)
Total Protein: 6.6 g/dL (ref 6.5–8.1)

## 2018-03-23 LAB — URINALYSIS, MICROSCOPIC (REFLEX)

## 2018-03-23 LAB — LIPASE, BLOOD: Lipase: 29 U/L (ref 11–51)

## 2018-03-23 LAB — TROPONIN I
Troponin I: 0.08 ng/mL (ref <0.03)
Troponin I: 0.12 ng/mL (ref <0.03)

## 2018-03-23 MED ORDER — PANTOPRAZOLE SODIUM 40 MG PO TBEC
40.0000 mg | DELAYED_RELEASE_TABLET | Freq: Every day | ORAL | Status: DC
  Administered 2018-03-24 – 2018-03-25 (×2): 40 mg via ORAL
  Filled 2018-03-23 (×2): qty 1

## 2018-03-23 MED ORDER — MECLIZINE HCL 25 MG PO TABS: 12.5000 mg | ORAL_TABLET | Freq: Three times a day (TID) | ORAL | Status: DC | PRN

## 2018-03-23 MED ORDER — ENOXAPARIN SODIUM 40 MG/0.4ML ~~LOC~~ SOLN
40.0000 mg | Freq: Every day | SUBCUTANEOUS | Status: DC
  Administered 2018-03-24 – 2018-03-25 (×2): 40 mg via SUBCUTANEOUS
  Filled 2018-03-23 (×2): qty 0.4

## 2018-03-23 MED ORDER — PANTOPRAZOLE SODIUM 40 MG IV SOLR: 40.0000 mg | INTRAVENOUS | Status: DC

## 2018-03-23 MED ORDER — ACETAMINOPHEN 325 MG PO TABS: 650.0000 mg | ORAL_TABLET | Freq: Four times a day (QID) | ORAL | Status: DC | PRN

## 2018-03-23 MED ORDER — ALUM & MAG HYDROXIDE-SIMETH 200-200-20 MG/5ML PO SUSP: 30.0000 mL | Freq: Four times a day (QID) | ORAL | Status: DC | PRN

## 2018-03-23 MED ORDER — PROMETHAZINE HCL 25 MG/ML IJ SOLN: 12.5000 mg | Freq: Four times a day (QID) | INTRAMUSCULAR | Status: DC | PRN

## 2018-03-23 MED ORDER — LEVOTHYROXINE SODIUM 112 MCG PO TABS
112.0000 ug | ORAL_TABLET | Freq: Every day | ORAL | Status: DC
  Administered 2018-03-24 – 2018-03-25 (×2): 112 ug via ORAL
  Filled 2018-03-23 (×2): qty 1

## 2018-03-23 MED ORDER — SODIUM CHLORIDE 0.9 % IV BOLUS
500.0000 mL | Freq: Once | INTRAVENOUS | Status: AC
  Administered 2018-03-23: 500 mL via INTRAVENOUS

## 2018-03-23 MED ORDER — ONDANSETRON 4 MG PO TBDP
4.0000 mg | ORAL_TABLET | Freq: Once | ORAL | Status: AC
  Administered 2018-03-23: 4 mg via ORAL
  Filled 2018-03-23: qty 1

## 2018-03-23 MED ORDER — MECLIZINE HCL 25 MG PO TABS
25.0000 mg | ORAL_TABLET | Freq: Once | ORAL | Status: AC
  Administered 2018-03-23: 25 mg via ORAL
  Filled 2018-03-23: qty 1

## 2018-03-23 MED ORDER — HYDRALAZINE HCL 20 MG/ML IJ SOLN: 10.0000 mg | INTRAMUSCULAR | Status: DC | PRN

## 2018-03-23 MED ORDER — SODIUM CHLORIDE 0.9 % IV SOLN
INTRAVENOUS | Status: DC
  Administered 2018-03-23 – 2018-03-24 (×2): via INTRAVENOUS

## 2018-03-23 MED ORDER — CO Q10 100 MG PO CAPS: 100.0000 mg | ORAL_CAPSULE | Freq: Every day | ORAL | Status: DC

## 2018-03-23 MED ORDER — ACETAMINOPHEN 650 MG RE SUPP: 650.0000 mg | Freq: Four times a day (QID) | RECTAL | Status: DC | PRN

## 2018-03-23 MED ORDER — ADULT MULTIVITAMIN W/MINERALS CH
1.0000 | ORAL_TABLET | Freq: Every day | ORAL | Status: DC
  Administered 2018-03-23 – 2018-03-24 (×2): 1 via ORAL
  Filled 2018-03-23 (×2): qty 1

## 2018-03-23 MED ORDER — PRAVASTATIN SODIUM 40 MG PO TABS
80.0000 mg | ORAL_TABLET | Freq: Every day | ORAL | Status: DC
  Administered 2018-03-23 – 2018-03-24 (×2): 80 mg via ORAL
  Filled 2018-03-23 (×2): qty 2

## 2018-03-23 NOTE — ED Notes (Addendum)
Pt up to bedside commode, given another warm blanket and updated to wait on admitting doctor. Nurse noticed a salon-pas pain patch on the middle of her lower back when pt was sitting on commode.

## 2018-03-23 NOTE — ED Notes (Signed)
Pt on monitor 

## 2018-03-23 NOTE — ED Notes (Signed)
Report given to [facility] 

## 2018-03-23 NOTE — Telephone Encounter (Signed)
Called and triaged pt. She advised that she has not been able to keep any fluids or foods down since Wednesday night. She advised that she has not had any urine output since then and has not had a BM either. Spoke with PCP who advised that due to lack of urine output pt should go to ER. Pt stated an understanding and asked me to call her daughter to advise. Called and spoke to daughter Gavin Pound and gave her PCP recommendations. Gavin Pound expressed an understanding and advised she would take pt to ED.

## 2018-03-23 NOTE — Progress Notes (Signed)
Dr. Loney Loh text paged via [facility] for admit orders. Awaiting for response.

## 2018-03-23 NOTE — ED Notes (Signed)
Pt unable to provide urine sample at this time 

## 2018-03-23 NOTE — ED Triage Notes (Signed)
Vomiting x 2 days. Denies diarrhea. No pain.  Indigestion that is not anything new or unusual for her. She states her tonsils are coated with white. She denies sore throat.

## 2018-03-23 NOTE — H&P (Addendum)
History and Physical    Tonya Manlylma Espericueta ZOX:[id number] DOB: 1930-05-03 DOA: 03/23/2018  PCP: Sheliah Hatchabori, Katherine E, MD Patient coming from: [facility]  Chief Complaint: Nausea, vomiting, fatigue  HPI: Tonya Johns is a 85 y.o. female with medical history significant of anemia, GERD, hyperlipidemia, hypothyroidism presenting to the hospital as a [facility] for evaluation of nausea, vomiting, and fatigue.  Patient reports 2-day history of nausea and vomiting.  States she has not been able to tolerate p.o. intake.  She has been having difficulty walking for the past 2 days secondary to dizziness and generalized weakness.  No falls.  Denies any recent sick contacts.  She did not eat anything new.  Does report having uncontrolled acid reflux.  Denies having any fevers, chills, abdominal pain, or diarrhea.  Denies having any chest pain or shortness of breath.  Review of Systems: As per HPI otherwise 10 point review of systems negative.  Past Medical History:  Diagnosis Date  . Anemia   . Arthritis   . Diverticulitis   . GERD (gastroesophageal reflux disease)   . Hyperlipidemia   . Hypothyroidism   . Leg swelling   . Osteoporosis     Past Surgical History:  Procedure Laterality Date  . EYE SURGERY Bilateral    cataract removal  . NO PAST SURGERIES    . TOTAL KNEE ARTHROPLASTY Left 01/09/2017   Procedure: TOTAL KNEE ARTHROPLASTY;  Surgeon: Gean Birchwoodowan, Frank, MD;  [facility];  Service: Orthopedics;  Laterality: Left;     reports that she has never smoked. She has never used smokeless tobacco. She reports that she does not drink alcohol or use drugs.  Allergies  Allergen Reactions  . Penicillins Rash    Did it involve swelling of the face/tongue/throat, SOB, or low BP? No Did it involve sudden or severe rash/hives, skin peeling, or any reaction on the inside of your mouth or nose? Yes Did you need to seek medical attention at a hospital or doctor's  office? No When did it last happen?approx 2005 If all above answers are "NO", may proceed with cephalosporin use.     Family History  Problem Relation Age of Onset  . Alcohol abuse Brother   . Alcohol abuse Sister   . Arthritis Mother   . Stroke Mother   . Hypertension Mother   . Arthritis Father   . Heart disease Father   . Hyperlipidemia Brother   . Hyperlipidemia Sister   . Sudden death Brother   . Mental illness Sister   . Diabetes Sister   . Cancer Daughter        breast    Prior to Admission medications   Medication Sig Start Date End Date Taking? Authorizing Provider  Coenzyme Q10 (CO Q10) 100 MG CAPS Take 100 mg by mouth daily.      [provider]  levothyroxine (SYNTHROID, LEVOTHROID) 112 MCG tablet TAKE 1 TABLET BY MOUTH EVERY DAY 03/22/18   Sheliah Hatchabori, Katherine E, MD  meclizine (ANTIVERT) 12.5 MG tablet Take 1 tablet (12.5 mg total) by mouth 3 (three) times daily as needed for dizziness. 02/27/18   Sheliah Hatchabori, Katherine E, MD  Multiple Vitamins-Minerals (MULTIVITAMIN PO) Take 1 tablet by mouth daily.    [provider]  pravastatin (PRAVACHOL) 80 MG tablet TAKE 1 TABLET BY MOUTH  DAILY 01/18/18   Sheliah Hatchabori, Katherine E, MD    Physical Exam: Vitals:   03/23/18 1500 03/23/18 1530 03/23/18 1700 03/23/18 1901  BP: (!) 174/60 (!) 184/74 (!) 162/62 (!) 187/70  Pulse: 60 65 (!) 57 62  Resp: 18 15 14    Temp:    97.7 F (36.5 C)  TempSrc:    Oral  SpO2: 96% 100% 96% 99%  Weight:    82.9 kg  Height:    5\' 4"  (1.626 m)    Physical Exam  Constitutional: She is oriented to person, place, and time. She appears well-developed and well-nourished. No distress.  HENT:  Head: Normocephalic.  Mouth/Throat: Oropharynx is clear and moist.  Eyes: Right eye exhibits no discharge. Left eye exhibits no discharge.  Neck: Neck supple.  Cardiovascular: Normal rate, regular rhythm and intact distal pulses.  Pulmonary/Chest: Effort normal and breath sounds normal. No  respiratory distress. She has no wheezes. She has no rales.  Abdominal: Soft. Bowel sounds are normal. She exhibits no distension. There is no abdominal tenderness. There is no rebound and no guarding.  Musculoskeletal:        General: No edema.  Neurological: She is alert and oriented to person, place, and time.  Speech fluent, tongue midline, no facial droop. Strength 5 out of 5 in bilateral upper and lower extremities. Sensation to light touch intact throughout.  Skin: Skin is warm and dry. She is not diaphoretic.  Psychiatric: She has a normal mood and affect. Her behavior is normal.     Labs on Admission: I have personally reviewed following labs and imaging studies  CBC: Recent Labs  Lab 03/23/18 1400  WBC 7.9  NEUTROABS 5.3  HGB 12.5  HCT 38.6  MCV 92.3  PLT 202   Basic Metabolic Panel: Recent Labs  Lab 03/23/18 1505  NA 140  K 3.9  CL 106  CO2 25  GLUCOSE 95  BUN 17  CREATININE 1.07*  CALCIUM 9.4   GFR: Estimated Creatinine Clearance: 40.1 mL/min (A) (by C-G formula based on SCr of 1.07 mg/dL (H)). Liver Function Tests: Recent Labs  Lab 03/23/18 1505  AST 19  ALT 14  ALKPHOS 79  BILITOT 0.5  PROT 6.6  ALBUMIN 3.2*   Recent Labs  Lab 03/23/18 1505  LIPASE 29   No results for input(s): AMMONIA in the last 168 hours. Coagulation Profile: No results for input(s): INR, PROTIME in the last 168 hours. Cardiac Enzymes: Recent Labs  Lab 03/23/18 1400 03/23/18 2010  TROPONINI 0.08* 0.12*   BNP (last 3 results) No results for input(s): PROBNP in the last 8760 hours. HbA1C: No results for input(s): HGBA1C in the last 72 hours. CBG: No results for input(s): GLUCAP in the last 168 hours. Lipid Profile: No results for input(s): CHOL, HDL, LDLCALC, TRIG, CHOLHDL, LDLDIRECT in the last 72 hours. Thyroid Function Tests: No results for input(s): TSH, T4TOTAL, FREET4, T3FREE, THYROIDAB in the last 72 hours. Anemia Panel: No results for input(s):  VITAMINB12, FOLATE, FERRITIN, TIBC, IRON, RETICCTPCT in the last 72 hours. Urine analysis:    Component Value Date/Time   COLORURINE YELLOW 03/23/2018 1600   APPEARANCEUR CLEAR 03/23/2018 1600   LABSPEC 1.010 03/23/2018 1600   PHURINE 7.5 03/23/2018 1600   GLUCOSEU NEGATIVE 03/23/2018 1600   HGBUR NEGATIVE 03/23/2018 1600   BILIRUBINUR NEGATIVE 03/23/2018 1600   BILIRUBINUR negative 05/22/2014 1459   KETONESUR NEGATIVE 03/23/2018 1600   PROTEINUR NEGATIVE 03/23/2018 1600   UROBILINOGEN 0.2 05/22/2014 1459   NITRITE NEGATIVE 03/23/2018 1600   LEUKOCYTESUR TRACE (A) 03/23/2018 1600    Radiological Exams on Admission: Ct Head Wo Contrast  Result Date: 03/23/2018  CLINICAL DATA:  Headache and dizziness for 2 days. EXAM: CT HEAD WITHOUT CONTRAST TECHNIQUE: Contiguous axial images were obtained from the base of the skull through the vertex without intravenous contrast. COMPARISON:  None. FINDINGS: Brain: No evidence of acute infarction, hemorrhage, hydrocephalus, extra-axial collection or mass lesion/mass effect. There is chronic diffuse atrophy. Chronic bilateral periventricular white matter small vessel ischemic changes noted. Vascular: No hyperdense vessel is noted. Skull: Normal. Negative for fracture or focal lesion. Sinuses/Orbits: No acute finding. Other: None. IMPRESSION: No focal acute intracranial abnormality identified. Chronic diffuse atrophy. Electronically Signed   By: Sherian Rein M.D.   On: 03/23/2018 13:55    EKG: Independently reviewed.  Sinus rhythm, slight ST depression in inferior and lateral leads, T wave inversions in inferior leads, QTC 502.  Assessment/Plan Principal Problem:   Viral gastroenteritis Active Problems:   Elevated troponin   Abnormal EKG   Dizziness   Gait instability   Bradycardia   Elevated blood pressure reading   Viral gastroenteritis -2-day history of nausea and vomiting.  No abdominal pain or diarrhea. -Afebrile and no leukocytosis.   Lipase and LFTs normal.  Abdominal exam benign. UA (not clean catch) showing few bacteria, trace leukocytes, and negative nitrite.  Patient is not endorsing any UTI symptoms. -Her presentation is likely secondary to viral gastroenteritis.  Dyspepsia/GERD could also be contributing. -IV fluid hydration -IV Phenergan PRN -PPI -GI cocktail PRN if able to tolerate p.o. intake  Dizziness, gait instability -Gait instability likely related to dizziness from dehydration.  Neuro exam nonfocal.  Head CT negative for acute finding.  Dizziness might be a chronic problem as meclizine is listed in patient's home medications. -IV fluid hydration -Check orthostatics -Fall precautions -Continue home meclizine as needed  Elevated troponin, abnormal EKG -Troponin mildly elevated at 0.08.  EKG showing slight ST depression in inferior and lateral leads and T wave inversions in inferior leads which are new compared to prior tracing.  Patient denies having any chest pain or shortness of breath.  Appears very comfortable on exam and in no distress. -Cardiac monitoring -Continue to trend troponin -Echocardiogram  Addendum: Repeat troponin 0.12.  Patient continues to be chest pain-free.  No respiratory distress.  She has not vomited since she has been in the hospital.  Discussed with cardiology.  Mild troponin elevation likely related to demand ischemia from elevated blood pressure. Continue to trend troponin.  Echo as mentioned above.  Risk factors for CAD include hypertension, hyperlipidemia, age, and family history.  Consider stress test during this hospitalization.  Bradycardia -Heart rate in the 50s to 60s. -Cardiac monitoring -Check TSH, free T4  Elevated blood pressure No documented history of hypertension.  Systolic as high as 190. -Hydralazine PRN -Continue to monitor blood pressure  Hyperlipidemia -Continue statin  Hypothyroidism -Continue Synthroid -Check TSH, free T4  GERD -PPI and GI  cocktail PRN as mentioned above  DVT prophylaxis: Lovenox Code Status: Patient wishes to be full code. Family Communication: Family at bedside. Disposition Plan: Anticipate discharge in 1 to 2 days. Consults called: None Admission status: Observation, telemetry   John Giovanni MD [facility] Pager [phone]  If 7PM-7AM, please contact night-coverage [email or web address] Password [facility]  03/23/2018, 10:08 PM

## 2018-03-23 NOTE — ED Provider Notes (Signed)
Emergency Department Provider Note   I have reviewed the triage vital signs and the nursing notes.   HISTORY  Chief Complaint Emesis   HPI Tonya Johns is a 85 y.o. female with PMH of anemia, GERD, and HLD presents to the emergency department for evaluation of nausea, vomiting, fatigue.  Describes some "dizziness" in which she mainly is lightheaded and denies vertigo symptoms.  No vision changes.  She has chronic ringing in the right ear which is unchanged.  She is able to walk but is requiring some additional assistance.  No chest pain, shortness of breath, heart palpitations. For 2 days.  Her vomiting is mainly after eating.  No diarrhea.  Patient denies any abdominal pain.  No radiation of symptoms or modifying factors.  Past Medical History:  Diagnosis Date  . Anemia   . Arthritis   . Diverticulitis   . GERD (gastroesophageal reflux disease)   . Hyperlipidemia   . Hypothyroidism   . Leg swelling   . Osteoporosis     Patient Active Problem List   Diagnosis Date Noted  . Elevated troponin 03/23/2018  . Primary osteoarthritis of left knee 01/09/2017  . Degenerative arthritis of left knee 01/05/2017  . Elevated blood pressure reading in office without diagnosis of hypertension 03/09/2015  . Lumbar back pain 05/22/2014  . Uterine prolapse 02/19/2014  . Hypothyroidism following radioiodine therapy 07/26/2011  . Elevated glucose 04/07/2011  . Multinodular goiter 12/10/2010  . Knee pain, bilateral 11/25/2010  . Depression 11/25/2010  . Heart murmur 11/25/2010  . Physical exam 07/14/2010  . GERD (gastroesophageal reflux disease) 06/15/2010  . Hyperlipidemia 06/15/2010    Past Surgical History:  Procedure Laterality Date  . EYE SURGERY Bilateral    cataract removal  . NO PAST SURGERIES    . TOTAL KNEE ARTHROPLASTY Left 01/09/2017   Procedure: TOTAL KNEE ARTHROPLASTY;  Surgeon: Gean Birchwoodowan, Frank, MD;  [facility];  Service: Orthopedics;  Laterality: Left;     Allergies Penicillins  Family History  Problem Relation Age of Onset  . Alcohol abuse Brother   . Alcohol abuse Sister   . Arthritis Mother   . Stroke Mother   . Hypertension Mother   . Arthritis Father   . Heart disease Father   . Hyperlipidemia Brother   . Hyperlipidemia Sister   . Sudden death Brother   . Mental illness Sister   . Diabetes Sister   . Cancer Daughter        breast    Social History Social History   Tobacco Use  . Smoking status: Never Smoker  . Smokeless tobacco: Never Used  Substance Use Topics  . Alcohol use: No  . Drug use: No    Review of Systems  Constitutional: No fever/chills. Positive fatigue and dizziness.  Eyes: No visual changes. ENT: No sore throat. Cardiovascular: Denies chest pain. Respiratory: Denies shortness of breath. Gastrointestinal: No abdominal pain. Positive vomiting.  No diarrhea.  No constipation. Genitourinary: Negative for dysuria. Musculoskeletal: Negative for back pain. Skin: Negative for rash. Neurological: Negative for headaches, focal weakness or numbness.  10-point ROS otherwise negative.  ____________________________________________   PHYSICAL EXAM:  VITAL SIGNS: ED Triage Vitals  Enc Vitals Group     BP 03/23/18 1129 (!) 190/82     Pulse Rate 03/23/18 1129 70     Resp 03/23/18 1129 18     Temp 03/23/18 1129 97.8 F (36.6 C)     Temp Source 03/23/18 1129 Oral     SpO2  03/23/18 1129 100 %     Weight 03/23/18 1126 180 lb (81.6 kg)     Height 03/23/18 1126 5\' 4"  (1.626 m)   Constitutional: Alert and oriented. Well appearing and in no acute distress. Eyes: Conjunctivae are normal. Head: Atraumatic. Nose: No congestion/rhinnorhea. Mouth/Throat: Mucous membranes are dry.   Oropharynx non-erythematous. Neck: No stridor.  Cardiovascular: Normal rate, regular rhythm. Good peripheral circulation. Grossly normal heart sounds.   Respiratory: Normal respiratory effort.  No retractions. Lungs  CTAB. Gastrointestinal: Soft and nontender. No distention.  Musculoskeletal: No lower extremity tenderness nor edema. No gross deformities of extremities. Neurologic:  Normal speech and language. No gross focal neurologic deficits are appreciated. Normal CN exam 2-12. No limb ataxia.  Skin:  Skin is warm, dry and intact. No rash noted.   ____________________________________________   LABS (all labs ordered are listed, but only abnormal results are displayed)  Labs Reviewed  TROPONIN I - Abnormal; Notable for the following components:      Result Value   Troponin I 0.08 (*)    All other components within normal limits  URINALYSIS, ROUTINE W REFLEX MICROSCOPIC - Abnormal; Notable for the following components:   Leukocytes,Ua TRACE (*)    All other components within normal limits  COMPREHENSIVE METABOLIC PANEL - Abnormal; Notable for the following components:   Creatinine, Ser 1.07 (*)    Albumin 3.2 (*)    GFR calc non Af Amer 47 (*)    GFR calc Af Amer 55 (*)    All other components within normal limits  URINALYSIS, MICROSCOPIC (REFLEX) - Abnormal; Notable for the following components:   Bacteria, UA FEW (*)    All other components within normal limits  CBC WITH DIFFERENTIAL/PLATELET  LIPASE, BLOOD   ____________________________________________  EKG   EKG Interpretation  Date/Time:  Friday March 23 2018 13:38:07 EST Ventricular Rate:  53 PR Interval:    QRS Duration: 100 QT Interval:  534 QTC Calculation: 502 R Axis:   -36 Text Interpretation:  Sinus rhythm LVH with secondary repolarization abnormality Anterior Q waves, possibly due to LVH Prolonged QT interval No STEMI.  Confirmed by Alona Bene [id number]) on 03/23/2018 2:51:58 PM       ____________________________________________  RADIOLOGY  Ct Head Wo Contrast  Result Date: 03/23/2018 CLINICAL DATA:  Headache and dizziness for 2 days. EXAM: CT HEAD WITHOUT CONTRAST TECHNIQUE: Contiguous axial images were  obtained from the base of the skull through the vertex without intravenous contrast. COMPARISON:  None. FINDINGS: Brain: No evidence of acute infarction, hemorrhage, hydrocephalus, extra-axial collection or mass lesion/mass effect. There is chronic diffuse atrophy. Chronic bilateral periventricular white matter small vessel ischemic changes noted. Vascular: No hyperdense vessel is noted. Skull: Normal. Negative for fracture or focal lesion. Sinuses/Orbits: No acute finding. Other: None. IMPRESSION: No focal acute intracranial abnormality identified. Chronic diffuse atrophy. Electronically Signed   By: Sherian Rein M.D.   On: 03/23/2018 13:55    ____________________________________________   PROCEDURES  Procedure(s) performed:   Procedures  None  ____________________________________________   INITIAL IMPRESSION / ASSESSMENT AND PLAN / ED COURSE  Pertinent labs & imaging results that were available during my care of the patient were reviewed by me and considered in my medical decision making (see chart for details).  Patient presents to the emergency department for evaluation of fatigue, nausea, lightheadedness.  No chest pain.  Patient appears dehydrated clinically.  Vital signs show bradycardia with hypertension normal oxygen saturation.  Patient's abdomen is completely soft and nontender.  Describing some instability with walking which is likely related to dehydration.  Patient has had vomiting but no diarrhea.  She did describe some "reflux" type symptoms intermittently that occur in the throat and radiate into the chest.  Troponin was obtained which showed 0.08.  No prior for comparison.  Lower suspicion for primary ACS but patient will likely require observational admission for trending, IV fluids, and reassess.  The patient does have complicating factor of prolonged QT.   Discussed patient's case with Hospitalist, Dr. Butler Denmark to request admission. Patient and family (if present) updated  with plan. Care transferred to Hospitalist service.  I reviewed all nursing notes, vitals, pertinent old records, EKGs, labs, imaging (as available).  ____________________________________________  FINAL CLINICAL IMPRESSION(S) / ED DIAGNOSES  Final diagnoses:  Non-intractable vomiting with nausea, unspecified vomiting type  Dehydration  Prolonged QT interval     MEDICATIONS GIVEN DURING THIS VISIT:  Medications  ondansetron (ZOFRAN-ODT) disintegrating tablet 4 mg (4 mg Oral Given 03/23/18 1133)  sodium chloride 0.9 % bolus 500 mL (0 mLs Intravenous Stopped 03/23/18 1611)  meclizine (ANTIVERT) tablet 25 mg (25 mg Oral Given 03/23/18 1417)    Note:  This document was prepared using Dragon voice recognition software and may include unintentional dictation errors.  Alona Bene, MD Emergency Medicine    Shamel Germond, Arlyss Repress, MD 03/23/18 [phone]

## 2018-03-23 NOTE — ED Notes (Signed)
Pt tolerated PO (cranberry juice).

## 2018-03-23 NOTE — ED Notes (Signed)
Vomiting x 2 days  Denies pain  No diarrhea

## 2018-03-24 ENCOUNTER — Observation Stay (HOSPITAL_BASED_OUTPATIENT_CLINIC_OR_DEPARTMENT_OTHER): Payer: Medicare Other

## 2018-03-24 DIAGNOSIS — R030 Elevated blood-pressure reading, without diagnosis of hypertension: Secondary | ICD-10-CM | POA: Diagnosis not present

## 2018-03-24 DIAGNOSIS — I351 Nonrheumatic aortic (valve) insufficiency: Secondary | ICD-10-CM

## 2018-03-24 DIAGNOSIS — R42 Dizziness and giddiness: Secondary | ICD-10-CM

## 2018-03-24 DIAGNOSIS — A084 Viral intestinal infection, unspecified: Secondary | ICD-10-CM | POA: Diagnosis not present

## 2018-03-24 DIAGNOSIS — R112 Nausea with vomiting, unspecified: Secondary | ICD-10-CM | POA: Diagnosis not present

## 2018-03-24 LAB — TSH: TSH: 0.381 u[IU]/mL (ref 0.350–4.500)

## 2018-03-24 LAB — ECHOCARDIOGRAM COMPLETE
Height: 64 in
Weight: 2910.4 oz

## 2018-03-24 LAB — TROPONIN I
Troponin I: 0.11 ng/mL (ref <0.03)
Troponin I: 0.06 ng/mL (ref <0.03)

## 2018-03-24 LAB — T4, FREE: Free T4: 1.43 ng/dL (ref 0.82–1.77)

## 2018-03-24 MED ORDER — ONDANSETRON HCL 4 MG PO TABS: 4.0000 mg | ORAL_TABLET | Freq: Three times a day (TID) | ORAL | Status: DC | PRN

## 2018-03-24 NOTE — Progress Notes (Signed)
  Echocardiogram 2D Echocardiogram has been performed.  Janalyn Harder 03/24/2018, 10:37 AM

## 2018-03-24 NOTE — Progress Notes (Signed)
Pt ambulated to 800 feet in the hallway , No c/o of dizziness ,No SOB, No chest discomfort noted per pt while ambulating.

## 2018-03-24 NOTE — Progress Notes (Signed)
Progress Note    Tonya Johns  PXT:[id number] DOB: 20-Apr-1930  DOA: 03/23/2018 PCP: Midge Minium, MD    Brief Narrative:     Medical records reviewed and are as summarized below:  Tonya Johns is an 85 y.o. female with medical history significant of anemia, GERD, hyperlipidemia, hypothyroidism presenting to the hospital as a transfer from [place] ED for evaluation of nausea, vomiting, and fatigue.  Patient reports 2-day history of nausea and vomiting.  States she has not been able to tolerate p.o. intake. N/V worse with spicy or fatty meals.  Assessment/Plan:   Principal Problem:   Viral gastroenteritis Active Problems:   Elevated troponin   Abnormal EKG   Dizziness   Gait instability   Bradycardia   Elevated blood pressure reading  N/V -IV fluid hydration -PRN zofran -per discussion, this has been going on longer than last few days but has worsened over the last few days where she cannot eat ANYTHING. -per chart review- had RUQ U/S in 2018 for elevated alk phos -check HIDA as symptoms worse with fatty intake -? GERD- PPI-- may need EGD if not improved  Dizziness, gait instability -likely related to dizziness from dehydration -ambulate after hydration  Elevated troponin, abnormal EKG -troponin flat -denies chest pain -echo pending read  Bradycardia -Heart rate in the 50s to 60s. -asymptomatic  Elevated blood pressure -Continue to monitor blood pressure  Hyperlipidemia -Continue statin  Hypothyroidism -Continue Synthroid -TSH, free T4 normal  GERD -PPI   obesity Body mass index is 31.22 kg/m.   Family Communication/Anticipated D/C date and plan/Code Status   DVT prophylaxis: Lovenox ordered. Code Status: Full Code.  Family Communication: at bedside Disposition Plan:   Medical Consultants:      Subjective:  Appetite improved since admission after IVF  Objective:    Vitals:   03/23/18 1530 03/23/18 1700  03/23/18 1901 03/24/18 0303  BP: (!) 184/74 (!) 162/62 (!) 187/70 (!) 146/58  Pulse: 65 (!) 57 62 62  Resp: 15 14    Temp:   97.7 F (36.5 C) 99.1 F (37.3 C)  TempSrc:   Oral Oral  SpO2: 100% 96% 99% 99%  Weight:   82.9 kg 82.5 kg  Height:   5' 4"  (1.626 m)     Intake/Output Summary (Last 24 hours) at 03/24/2018 1247 Last data filed at 03/24/2018 0427 Gross per 24 hour  Intake 1198.45 ml  Output -  Net 1198.45 ml   Filed Weights   03/23/18 1126 03/23/18 1901 03/24/18 0303  Weight: 81.6 kg 82.9 kg 82.5 kg    Exam: In bed, NAD rrr No RUQ tenderness, +BS, soft No wheezing, no increased work of breathing No rashes or lesions  Data Reviewed:   I have personally reviewed following labs and imaging studies:  Labs: Labs show the following:   Basic Metabolic Panel: Recent Labs  Lab 03/23/18 1505  NA 140  K 3.9  CL 106  CO2 25  GLUCOSE 95  BUN 17  CREATININE 1.07*  CALCIUM 9.4   GFR Estimated Creatinine Clearance: 39.9 mL/min (A) (by C-G formula based on SCr of 1.07 mg/dL (H)). Liver Function Tests: Recent Labs  Lab 03/23/18 1505  AST 19  ALT 14  ALKPHOS 79  BILITOT 0.5  PROT 6.6  ALBUMIN 3.2*   Recent Labs  Lab 03/23/18 1505  LIPASE 29   No results for input(s): AMMONIA in the last 168 hours. Coagulation profile No results for input(s): INR, PROTIME  in the last 168 hours.  CBC: Recent Labs  Lab 03/23/18 1400  WBC 7.9  NEUTROABS 5.3  HGB 12.5  HCT 38.6  MCV 92.3  PLT 202   Cardiac Enzymes: Recent Labs  Lab 03/23/18 1400 03/23/18 2010 03/24/18 0228 03/24/18 0848  TROPONINI 0.08* 0.12* 0.11* 0.06*   BNP (last 3 results) No results for input(s): PROBNP in the last 8760 hours. CBG: No results for input(s): GLUCAP in the last 168 hours. D-Dimer: No results for input(s): DDIMER in the last 72 hours. Hgb A1c: No results for input(s): HGBA1C in the last 72 hours. Lipid Profile: No results for input(s): CHOL, HDL, LDLCALC, TRIG,  CHOLHDL, LDLDIRECT in the last 72 hours. Thyroid function studies: Recent Labs    03/24/18 0228  TSH 0.381   Anemia work up: No results for input(s): VITAMINB12, FOLATE, FERRITIN, TIBC, IRON, RETICCTPCT in the last 72 hours. Sepsis Labs: Recent Labs  Lab 03/23/18 1400  WBC 7.9    Microbiology No results found for this or any previous visit (from the past 240 hour(s)).  Procedures and diagnostic studies:  Ct Head Wo Contrast  Result Date: 03/23/2018 CLINICAL DATA:  Headache and dizziness for 2 days. EXAM: CT HEAD WITHOUT CONTRAST TECHNIQUE: Contiguous axial images were obtained from the base of the skull through the vertex without intravenous contrast. COMPARISON:  None. FINDINGS: Brain: No evidence of acute infarction, hemorrhage, hydrocephalus, extra-axial collection or mass lesion/mass effect. There is chronic diffuse atrophy. Chronic bilateral periventricular white matter small vessel ischemic changes noted. Vascular: No hyperdense vessel is noted. Skull: Normal. Negative for fracture or focal lesion. Sinuses/Orbits: No acute finding. Other: None. IMPRESSION: No focal acute intracranial abnormality identified. Chronic diffuse atrophy. Electronically Signed   By: Abelardo Diesel M.D.   On: 03/23/2018 13:55    Medications:   . enoxaparin (LOVENOX) injection  40 mg Subcutaneous Daily  . levothyroxine  112 mcg Oral Q0600  . multivitamin with minerals  1 tablet Oral QHS  . pantoprazole  40 mg Oral Daily  . pravastatin  80 mg Oral QHS   Continuous Infusions:   LOS: 0 days   [name]  [facility]   How to contact the [facility] Attending or Consulting provider [place] or covering provider during after hours [place], for this patient?  1. Check the care team in [facility] and look for a) attending/consulting [facility] provider listed and b) the [facility] team listed 2. Log into [email or web address] and use [place]'s universal password to access. If you do not have the password, please contact the  hospital operator. 3. Locate the [facility] provider you are looking for under [facility] and page to a number that you can be directly reached. 4. If you still have difficulty reaching the provider, please page the [facility] (Director on Call) for the Hospitalists listed on amion for assistance.  03/24/2018, 12:47 PM

## 2018-03-25 ENCOUNTER — Observation Stay (HOSPITAL_COMMUNITY): Payer: Medicare Other

## 2018-03-25 DIAGNOSIS — R112 Nausea with vomiting, unspecified: Secondary | ICD-10-CM | POA: Diagnosis not present

## 2018-03-25 DIAGNOSIS — A084 Viral intestinal infection, unspecified: Secondary | ICD-10-CM | POA: Diagnosis not present

## 2018-03-25 MED ORDER — ONDANSETRON HCL 4 MG PO TABS: 4.0000 mg | ORAL_TABLET | Freq: Three times a day (TID) | ORAL | 0 refills | Status: DC | PRN

## 2018-03-25 MED ORDER — FAMOTIDINE 20 MG PO TABS: 20.0000 mg | ORAL_TABLET | Freq: Every day | ORAL | 0 refills | Status: DC

## 2018-03-25 MED ORDER — TECHNETIUM TC 99M MEBROFENIN IV KIT
5.2000 | PACK | Freq: Once | INTRAVENOUS | Status: AC | PRN
  Administered 2018-03-25: 5.2 via INTRAVENOUS

## 2018-03-25 NOTE — Discharge Summary (Signed)
Physician Discharge Summary  Tonya Johns ZOX:[id number] DOB: 11-09-1930 DOA: 03/23/2018  PCP: Midge Minium, MD  Admit date: 03/23/2018 Discharge date: 03/25/2018  Admitted From: home Discharge disposition: home   Recommendations for Outpatient Follow-Up:   1. Follow up for resolution of GERD symptoms may need GI referral vs h yplori testing if not improved 2. EKG at next office visit for Qtc 3. Avoid NSAIDs   Discharge Diagnosis:   Principal Problem:   Viral gastroenteritis Active Problems:   Elevated troponin   Abnormal EKG   Dizziness   Gait instability   Bradycardia   Elevated blood pressure reading    Discharge Condition: Improved.  Diet recommendation: GERD  Wound care: None.  Code status: Full.   History of Present Illness:  Tonya Johns is a 85 y.o. female with medical history significant of anemia, GERD, hyperlipidemia, hypothyroidism presenting to the hospital as a transfer from [place] ED for evaluation of nausea, vomiting, and fatigue.  Patient reports 2-day history of nausea and vomiting.  States she has not been able to tolerate p.o. intake.  She has been having difficulty walking for the past 2 days secondary to dizziness and generalized weakness.  No falls.  Denies any recent sick contacts.  She did not eat anything new.  Does report having uncontrolled acid reflux.  Denies having any fevers, chills, abdominal pain, or diarrhea.  Denies having any chest pain or shortness of breath.   Hospital Course by Problem:   N/V -IV fluid hydration -PRN zofran -per discussion, this has been going on longer than last few days but has worsened over the last few days where she cannot eat ANYTHING. -per chart review- had RUQ U/S in 2018 for elevated alk phos -HIDA Normal -? GERD- PPI-- may need EGD if not improved after treatment-- ? h pylori testing/treatment  Dizziness, gait instability -likely related to dizziness from  dehydration -ambulating well  Elevated troponin, abnormal EKG -troponin flat -denies chest pain -echo: 1. The left ventricle has normal systolic function with an ejection fraction of 60-65%. The cavity size was normal. There is mild asymmetric left ventricular hypertrophy of the anteroseptal wall. Left ventricular diastolic Doppler parameters are  consistent with impaired relaxation.  2. The right ventricle has normal systolic function. The cavity was normal. There is no increase in right ventricular wall thickness.  3. Right atrial size was mildly dilated.  4. The mitral valve is degenerative.  5. The tricuspid valve is normal in structure and degenerative.  6. The aortic valve is tricuspid Mild thickening of the aortic valve Mild calcification of the aortic valve. Aortic valve regurgitation is mild to moderate by color flow Doppler. The jet is centrally-directed.  7. The pulmonic valve was normal in structure.  Bradycardia -Heart rate in the 50s to 60s. -asymptomatic  Hyperlipidemia -Continue statin  Hypothyroidism -Continue Synthroid -TSH, free T4 normal  GERD -PPI   obesity Body mass index is 31.22 kg/m.    Medical Consultants:      Discharge Exam:   Vitals:   03/24/18 2024 03/25/18 0612  BP: (!) 150/58 (!) 153/52  Pulse: (!) 56   Resp: 14   Temp: 98.5 F (36.9 C) 98.7 F (37.1 C)  SpO2: 99% 100%   Vitals:   03/24/18 0303 03/24/18 1425 03/24/18 2024 03/25/18 0612  BP: (!) 146/58 (!) 149/56 (!) 150/58 (!) 153/52  Pulse: 62 (!) 55 (!) 56   Resp:   14   Temp:  99.1 F (37.3 C) 98.5 F (36.9 C) 98.5 F (36.9 C) 98.7 F (37.1 C)  TempSrc: Oral Oral Oral Oral  SpO2: 99% 100% 99% 100%  Weight: 82.5 kg   83.8 kg  Height:        General exam: Appears calm and comfortable.   The results of significant diagnostics from this hospitalization (including imaging, microbiology, ancillary and laboratory) are listed below for reference.     Procedures  and Diagnostic Studies:   Ct Head Wo Contrast  Result Date: 03/23/2018 CLINICAL DATA:  Headache and dizziness for 2 days. EXAM: CT HEAD WITHOUT CONTRAST TECHNIQUE: Contiguous axial images were obtained from the base of the skull through the vertex without intravenous contrast. COMPARISON:  None. FINDINGS: Brain: No evidence of acute infarction, hemorrhage, hydrocephalus, extra-axial collection or mass lesion/mass effect. There is chronic diffuse atrophy. Chronic bilateral periventricular white matter small vessel ischemic changes noted. Vascular: No hyperdense vessel is noted. Skull: Normal. Negative for fracture or focal lesion. Sinuses/Orbits: No acute finding. Other: None. IMPRESSION: No focal acute intracranial abnormality identified. Chronic diffuse atrophy. Electronically Signed   By: Abelardo Diesel M.D.   On: 03/23/2018 13:55     Labs:   Basic Metabolic Panel: Recent Labs  Lab 03/23/18 1505  NA 140  K 3.9  CL 106  CO2 25  GLUCOSE 95  BUN 17  CREATININE 1.07*  CALCIUM 9.4   GFR Estimated Creatinine Clearance: 40.2 mL/min (A) (by C-G formula based on SCr of 1.07 mg/dL (H)). Liver Function Tests: Recent Labs  Lab 03/23/18 1505  AST 19  ALT 14  ALKPHOS 79  BILITOT 0.5  PROT 6.6  ALBUMIN 3.2*   Recent Labs  Lab 03/23/18 1505  LIPASE 29   No results for input(s): AMMONIA in the last 168 hours. Coagulation profile No results for input(s): INR, PROTIME in the last 168 hours.  CBC: Recent Labs  Lab 03/23/18 1400  WBC 7.9  NEUTROABS 5.3  HGB 12.5  HCT 38.6  MCV 92.3  PLT 202   Cardiac Enzymes: Recent Labs  Lab 03/23/18 1400 03/23/18 2010 03/24/18 0228 03/24/18 0848  TROPONINI 0.08* 0.12* 0.11* 0.06*   BNP: Invalid input(s): POCBNP CBG: No results for input(s): GLUCAP in the last 168 hours. D-Dimer No results for input(s): DDIMER in the last 72 hours. Hgb A1c No results for input(s): HGBA1C in the last 72 hours. Lipid Profile No results for  input(s): CHOL, HDL, LDLCALC, TRIG, CHOLHDL, LDLDIRECT in the last 72 hours. Thyroid function studies Recent Labs    03/24/18 0228  TSH 0.381   Anemia work up No results for input(s): VITAMINB12, FOLATE, FERRITIN, TIBC, IRON, RETICCTPCT in the last 72 hours. Microbiology No results found for this or any previous visit (from the past 240 hour(s)).   Discharge Instructions:   Discharge Instructions    Discharge instructions   Complete by:  As directed    See information about diet-- if not improved, may need GI follow up for endoscopy Gallbladder is working great!   Increase activity slowly   Complete by:  As directed      Allergies as of 03/25/2018      Reactions   Penicillins Rash   Did it involve swelling of the face/tongue/throat, SOB, or low BP? No Did it involve sudden or severe rash/hives, skin peeling, or any reaction on the inside of your mouth or nose? Yes Did you need to seek medical attention at a hospital or doctor's office? No When did it  last happen?approx 2005 If all above answers are "NO", may proceed with cephalosporin use.      Medication List    STOP taking these medications   ibuprofen 200 MG tablet Commonly known as:  ADVIL,MOTRIN     TAKE these medications   BLUE-EMU SUPER STRENGTH EX Apply 1 application topically 2 (two) times daily. For knee pain   SALONPAS PAIN RELIEF PATCH EX Apply 1 patch topically at bedtime.   Co Q10 100 MG Caps Take 100 mg by mouth at bedtime.   famotidine 20 MG tablet Commonly known as:  PEPCID Take 1 tablet (20 mg total) by mouth daily.   levothyroxine 112 MCG tablet Commonly known as:  SYNTHROID, LEVOTHROID TAKE 1 TABLET BY MOUTH EVERY DAY What changed:  when to take this   meclizine 12.5 MG tablet Commonly known as:  ANTIVERT Take 1 tablet (12.5 mg total) by mouth 3 (three) times daily as needed for dizziness.   multivitamin with minerals Tabs tablet Take 1 tablet by mouth at bedtime.    ondansetron 4 MG tablet Commonly known as:  ZOFRAN Take 1 tablet (4 mg total) by mouth every 8 (eight) hours as needed for nausea or vomiting.   pravastatin 80 MG tablet Commonly known as:  PRAVACHOL TAKE 1 TABLET BY MOUTH  DAILY What changed:  when to take this      Follow-up Information    Midge Minium, MD Follow up in 1 week(s).   Specialty:  Family Medicine Contact information: [phone] [place] [address] [phone]            Time coordinating discharge: 25 min  Signed:  [name] DO  [facility] 03/25/2018, 12:48 PM

## 2018-03-26 ENCOUNTER — Telehealth: Payer: Self-pay

## 2018-03-26 NOTE — Telephone Encounter (Signed)
Spoke with patients daughter, Gavin Pound.   Transition Care Management Follow-up Telephone Call  Admit date: 03/23/2018 Discharge date: 03/25/2018 Principal Problem:  Viral gastroenteritis   How have you been since you were released from the hospital? "I feel better". Patient eating lunch without issue.    Do you understand why you were in the hospital? yes   Do you understand the discharge instructions? yes   Where were you discharged to? Home. Family with patient.    Items Reviewed:  Medications reviewed: yes  Allergies reviewed: yes  Dietary changes reviewed: yes  Referrals reviewed: yes   Functional Questionnaire:   Activities of Daily Living (ADLs):   She states they are independent in the following: ambulation, bathing and hygiene, feeding, continence, grooming, toileting and dressing States they require assistance with the following: None.    Any transportation issues/concerns?: no   Any patient concerns? no   Confirmed importance and date/time of follow-up visits scheduled yes  Provider Appointment booked with PCP 03/29/2018  Confirmed with patient if condition begins to worsen call PCP or go to the ER.  Patient was given the office number and encouraged to call back with question or concerns.  : yes

## 2018-03-29 ENCOUNTER — Other Ambulatory Visit: Payer: Self-pay | Admitting: General Practice

## 2018-03-29 ENCOUNTER — Ambulatory Visit: Payer: Medicare Other

## 2018-03-29 ENCOUNTER — Ambulatory Visit (INDEPENDENT_AMBULATORY_CARE_PROVIDER_SITE_OTHER): Payer: Medicare Other | Admitting: Family Medicine

## 2018-03-29 ENCOUNTER — Other Ambulatory Visit: Payer: Self-pay

## 2018-03-29 ENCOUNTER — Encounter: Payer: Self-pay | Admitting: Family Medicine

## 2018-03-29 VITALS — BP 123/76 | HR 78 | Temp 98.0°F | Resp 17 | Ht 64.0 in | Wt 186.1 lb

## 2018-03-29 DIAGNOSIS — Z87898 Personal history of other specified conditions: Secondary | ICD-10-CM | POA: Diagnosis not present

## 2018-03-29 DIAGNOSIS — R9431 Abnormal electrocardiogram [ECG] [EKG]: Secondary | ICD-10-CM

## 2018-03-29 DIAGNOSIS — M25562 Pain in left knee: Secondary | ICD-10-CM | POA: Diagnosis not present

## 2018-03-29 DIAGNOSIS — K219 Gastro-esophageal reflux disease without esophagitis: Secondary | ICD-10-CM

## 2018-03-29 DIAGNOSIS — M25561 Pain in right knee: Secondary | ICD-10-CM | POA: Diagnosis not present

## 2018-03-29 DIAGNOSIS — G8929 Other chronic pain: Secondary | ICD-10-CM

## 2018-03-29 DIAGNOSIS — R42 Dizziness and giddiness: Secondary | ICD-10-CM

## 2018-03-29 MED ORDER — DICLOFENAC SODIUM 1 % TD GEL: 4.0000 g | Freq: Four times a day (QID) | TRANSDERMAL | 3 refills | Status: DC

## 2018-03-29 MED ORDER — FAMOTIDINE 20 MG PO TABS: 20.0000 mg | ORAL_TABLET | Freq: Every day | ORAL | 0 refills | Status: DC

## 2018-03-29 MED ORDER — LEVOTHYROXINE SODIUM 112 MCG PO TABS: 112.0000 ug | ORAL_TABLET | Freq: Every day | ORAL | 1 refills | Status: DC

## 2018-03-29 NOTE — Assessment & Plan Note (Signed)
Ongoing issue for pt.  Since she is not able to tolerate NSAIDs, start Voltaren gel for pain relief.

## 2018-03-29 NOTE — Assessment & Plan Note (Signed)
Resolved.  Suspect this was due to her dehydration.

## 2018-03-29 NOTE — Progress Notes (Signed)
   Subjective:    Patient ID: Tonya Johns, female    DOB: 1930-09-22, 85 y.o.   MRN: [id number]  HPI Hospital f/u- pt was admitted 2/21-23 w/ N/V/D, dehydration, dizziness.  She had QT prolongation and they recommended repeat EKG.  Pt reports no longer having N/V/D.  Now able to eat.  Urinating normally.  GERD is improving.  No CP.  Dizziness has resolved.  She reports only issue is her knee pain now that they've stopped NSAIDs.  Hospital stopped NSAIDs due to gastritis.  Med list reviewed and reconciled. Reviewed D/C summary, labs, EKG, ECHO, and imaging   Review of Systems For ROS see HPI     Objective:   Physical Exam Vitals signs reviewed.  Constitutional:      General: She is not in acute distress.    Appearance: She is well-developed. She is obese.  HENT:     Head: Normocephalic and atraumatic.  Eyes:     Conjunctiva/sclera: Conjunctivae normal.     Pupils: Pupils are equal, round, and reactive to light.  Neck:     Musculoskeletal: Normal range of motion and neck supple.     Thyroid: No thyromegaly.  Cardiovascular:     Rate and Rhythm: Normal rate and regular rhythm.     Heart sounds: Normal heart sounds. No murmur.  Pulmonary:     Effort: Pulmonary effort is normal. No respiratory distress.     Breath sounds: Normal breath sounds.  Abdominal:     General: There is no distension.     Palpations: Abdomen is soft.     Tenderness: There is no abdominal tenderness.  Lymphadenopathy:     Cervical: No cervical adenopathy.  Skin:    General: Skin is warm and dry.  Neurological:     Mental Status: She is alert and oriented to person, place, and time.  Psychiatric:        Behavior: Behavior normal.           Assessment & Plan:

## 2018-03-29 NOTE — Patient Instructions (Addendum)
Follow up as needed or as scheduled The interval on your EKG that was prolonged is now normal- great news! Continue to drink your fluids and eat regularly ICE your knee USE the Voltaren gel that I sent in for your knee pain- this will help w/ pain and inflammation Call with any questions or concerns Have a great weekend!

## 2018-03-29 NOTE — Assessment & Plan Note (Signed)
Improved since hospital d/c.  Doing well on Famotidine.  Continue current regimen.

## 2018-03-29 NOTE — Assessment & Plan Note (Signed)
QT interval on EKG today is normal.  Remainder of EKG is unchanged from previous.  Currently asymptomatic.  No further work up needed

## 2018-04-20 ENCOUNTER — Other Ambulatory Visit: Payer: Self-pay | Admitting: Family Medicine

## 2018-05-14 ENCOUNTER — Encounter: Payer: Medicare Other | Admitting: Family Medicine

## 2018-05-14 ENCOUNTER — Ambulatory Visit: Payer: Medicare Other

## 2018-06-18 ENCOUNTER — Other Ambulatory Visit: Payer: Self-pay | Admitting: Family Medicine

## 2018-07-12 ENCOUNTER — Ambulatory Visit (INDEPENDENT_AMBULATORY_CARE_PROVIDER_SITE_OTHER): Payer: Medicare Other | Admitting: Family Medicine

## 2018-07-12 ENCOUNTER — Encounter: Payer: Self-pay | Admitting: Family Medicine

## 2018-07-12 ENCOUNTER — Ambulatory Visit: Payer: Medicare Other

## 2018-07-12 ENCOUNTER — Other Ambulatory Visit: Payer: Self-pay

## 2018-07-12 VITALS — BP 140/70 | HR 64 | Temp 97.9°F | Resp 97 | Ht 64.0 in | Wt 189.2 lb

## 2018-07-12 DIAGNOSIS — Z0000 Encounter for general adult medical examination without abnormal findings: Secondary | ICD-10-CM

## 2018-07-12 DIAGNOSIS — E785 Hyperlipidemia, unspecified: Secondary | ICD-10-CM

## 2018-07-12 LAB — CBC WITH DIFFERENTIAL/PLATELET
Basophils Absolute: 0.1 10*3/uL (ref 0.0–0.1)
Basophils Relative: 0.8 % (ref 0.0–3.0)
Eosinophils Absolute: 0.1 10*3/uL (ref 0.0–0.7)
Eosinophils Relative: 1.3 % (ref 0.0–5.0)
HCT: 35.2 % — ABNORMAL LOW (ref 36.0–46.0)
Hemoglobin: 11.7 g/dL — ABNORMAL LOW (ref 12.0–15.0)
Lymphocytes Relative: 26.3 % (ref 12.0–46.0)
Lymphs Abs: 1.9 10*3/uL (ref 0.7–4.0)
MCHC: 33.3 g/dL (ref 30.0–36.0)
MCV: 90.2 fl (ref 78.0–100.0)
Monocytes Absolute: 0.4 10*3/uL (ref 0.1–1.0)
Monocytes Relative: 5.6 % (ref 3.0–12.0)
Neutro Abs: 4.6 10*3/uL (ref 1.4–7.7)
Neutrophils Relative %: 66 % (ref 43.0–77.0)
Platelets: 206 10*3/uL (ref 150.0–400.0)
RBC: 3.9 Mil/uL (ref 3.87–5.11)
RDW: 13.7 % (ref 11.5–15.5)
WBC: 7 10*3/uL (ref 4.0–10.5)

## 2018-07-12 LAB — HEPATIC FUNCTION PANEL
ALT: 12 U/L (ref 0–35)
AST: 17 U/L (ref 0–37)
Albumin: 4 g/dL (ref 3.5–5.2)
Alkaline Phosphatase: 81 U/L (ref 39–117)
Bilirubin, Direct: 0.1 mg/dL (ref 0.0–0.3)
Total Bilirubin: 0.4 mg/dL (ref 0.2–1.2)
Total Protein: 6.2 g/dL (ref 6.0–8.3)

## 2018-07-12 LAB — BASIC METABOLIC PANEL
BUN: 25 mg/dL — ABNORMAL HIGH (ref 6–23)
CO2: 25 mEq/L (ref 19–32)
Calcium: 9.4 mg/dL (ref 8.4–10.5)
Chloride: 105 mEq/L (ref 96–112)
Creatinine, Ser: 1.19 mg/dL (ref 0.40–1.20)
GFR: 52.02 mL/min — ABNORMAL LOW (ref >60.00)
Glucose, Bld: 102 mg/dL — ABNORMAL HIGH (ref 70–99)
Potassium: 4.2 mEq/L (ref 3.5–5.1)
Sodium: 139 mEq/L (ref 135–145)

## 2018-07-12 LAB — LIPID PANEL
Cholesterol: 178 mg/dL (ref 0–200)
HDL: 58.4 mg/dL (ref >39.00)
LDL Cholesterol: 91 mg/dL (ref 0–99)
NonHDL: 119.26
Total CHOL/HDL Ratio: 3
Triglycerides: 143 mg/dL (ref 0.0–149.0)
VLDL: 28.6 mg/dL (ref 0.0–40.0)

## 2018-07-12 LAB — TSH: TSH: 0.12 u[IU]/mL — ABNORMAL LOW (ref 0.35–4.50)

## 2018-07-12 NOTE — Progress Notes (Signed)
   Subjective:    Patient ID: Tonya Johns, female    DOB: Jun 18, 1930, 85 y.o.   MRN: [id number]  HPI CPE- Pt is UTD on immunizations.  Pt is no longer getting mammograms and no need for colonoscopy.  No concerns today   Review of Systems Patient reports no vision/ hearing changes, adenopathy,fever, weight change,  persistant/recurrent hoarseness , swallowing issues, chest pain, palpitations, edema, persistant/recurrent cough, hemoptysis, dyspnea (rest/exertional/paroxysmal nocturnal), gastrointestinal bleeding (melena, rectal bleeding), abdominal pain, significant heartburn, bowel changes, GU symptoms (dysuria, hematuria, incontinence), Gyn symptoms (abnormal  bleeding, pain),  syncope, focal weakness, memory loss, numbness & tingling, skin/hair/nail changes, abnormal bruising or bleeding, anxiety, or depression.     Objective:   Physical Exam General Appearance:    Alert, cooperative, no distress, appears stated age  Head:    Normocephalic, without obvious abnormality, atraumatic  Eyes:    PERRL, conjunctiva/corneas clear, EOM's intact, fundi    benign, both eyes  Ears:    Normal TM's and external ear canals, both ears  Nose:   Nares normal, septum midline, mucosa normal, no drainage    or sinus tenderness  Throat:   Lips, mucosa, and tongue normal; teeth and gums normal  Neck:   Supple, symmetrical, trachea midline, no adenopathy;    Thyroid: multinodular goiter  Back:     Symmetric, no curvature, ROM normal, no CVA tenderness  Lungs:     Clear to auscultation bilaterally, respirations unlabored  Chest Wall:    No tenderness or deformity   Heart:    Regular rate and rhythm, S1 and S2 normal, no murmur, rub   or gallop  Breast Exam:    Deferred  Abdomen:     Soft, non-tender, bowel sounds active all four quadrants,    no masses, no organomegaly  Genitalia:    Deferred  Rectal:    Extremities:   Extremities normal, atraumatic, no cyanosis or edema  Pulses:   2+ and symmetric all  extremities  Skin:   Skin color, texture, turgor normal, no rashes or lesions  Lymph nodes:   Cervical, supraclavicular, and axillary nodes normal  Neurologic:   CNII-XII intact, normal strength, sensation and reflexes    throughout          Assessment & Plan:

## 2018-07-12 NOTE — Assessment & Plan Note (Signed)
Chronic problem.  Tolerating statin w/o difficulty.  Check labs.  Adjust meds prn  

## 2018-07-12 NOTE — Assessment & Plan Note (Signed)
Pt's PE WNL w/ exception of known goiter.  No longer having mammograms or colonoscopy.  UTD on immunizations.  Check labs.  Anticipatory guidance provided.

## 2018-07-12 NOTE — Patient Instructions (Signed)
Follow up in 6 months to recheck cholesterol We'll notify you of your lab results and make any changes if needed Keep up the good work!  You look great! Call with any questions or concerns Stay Safe! 

## 2018-07-13 ENCOUNTER — Ambulatory Visit (INDEPENDENT_AMBULATORY_CARE_PROVIDER_SITE_OTHER): Payer: Medicare Other

## 2018-07-13 ENCOUNTER — Other Ambulatory Visit: Payer: Self-pay | Admitting: General Practice

## 2018-07-13 VITALS — BP 140/70 | HR 64 | Temp 97.9°F | Ht 64.0 in | Wt 189.2 lb

## 2018-07-13 DIAGNOSIS — E890 Postprocedural hypothyroidism: Secondary | ICD-10-CM

## 2018-07-13 DIAGNOSIS — Z0000 Encounter for general adult medical examination without abnormal findings: Secondary | ICD-10-CM

## 2018-07-13 MED ORDER — LEVOTHYROXINE SODIUM 100 MCG PO TABS: 100.0000 ug | ORAL_TABLET | Freq: Every day | ORAL | 1 refills | Status: DC

## 2018-07-13 NOTE — Patient Instructions (Addendum)
Bring a copy of your living will and/or healthcare power of attorney to your next office visit.  Continue doing brain stimulating activities (puzzles, reading, adult coloring books, staying active) to keep memory sharp.     Fall Prevention in the Home, Adult Falls can cause injuries. They can happen to people of all ages. There are many things you can do to make your home safe and to help prevent falls. Ask for help when making these changes, if needed. What actions can I take to prevent falls? General Instructions  Use good lighting in all rooms. Replace any light bulbs that burn out.  Turn on the lights when you go into a dark area. Use night-lights.  Keep items that you use often in easy-to-reach places. Lower the shelves around your home if necessary.  Set up your furniture so you have a clear path. Avoid moving your furniture around.  Do not have throw rugs and other things on the floor that can make you trip.  Avoid walking on wet floors.  If any of your floors are uneven, fix them.  Add color or contrast paint or tape to clearly mark and help you see: ? Any grab bars or handrails. ? First and last steps of stairways. ? Where the edge of each step is.  If you use a stepladder: ? Make sure that it is fully opened. Do not climb a closed stepladder. ? Make sure that both sides of the stepladder are locked into place. ? Ask someone to hold the stepladder for you while you use it.  If there are any pets around you, be aware of where they are. What can I do in the bathroom?      Keep the floor dry. Clean up any water that spills onto the floor as soon as it happens.  Remove soap buildup in the tub or shower regularly.  Use non-skid mats or decals on the floor of the tub or shower.  Attach bath mats securely with double-sided, non-slip rug tape.  If you need to sit down in the shower, use a plastic, non-slip stool.  Install grab bars by the toilet and in the tub and  shower. Do not use towel bars as grab bars. What can I do in the bedroom?  Make sure that you have a light by your bed that is easy to reach.  Do not use any sheets or blankets that are too big for your bed. They should not hang down onto the floor.  Have a firm chair that has side arms. You can use this for support while you get dressed. What can I do in the kitchen?  Clean up any spills right away.  If you need to reach something above you, use a strong step stool that has a grab bar.  Keep electrical cords out of the way.  Do not use floor polish or wax that makes floors slippery. If you must use wax, use non-skid floor wax. What can I do with my stairs?  Do not leave any items on the stairs.  Make sure that you have a light switch at the top of the stairs and the bottom of the stairs. If you do not have them, ask someone to add them for you.  Make sure that there are handrails on both sides of the stairs, and use them. Fix handrails that are broken or loose. Make sure that handrails are as long as the stairways.  Install non-slip stair treads on  all stairs in your home.  Avoid having throw rugs at the top or bottom of the stairs. If you do have throw rugs, attach them to the floor with carpet tape.  Choose a carpet that does not hide the edge of the steps on the stairway.  Check any carpeting to make sure that it is firmly attached to the stairs. Fix any carpet that is loose or worn. What can I do on the outside of my home?  Use bright outdoor lighting.  Regularly fix the edges of walkways and driveways and fix any cracks.  Remove anything that might make you trip as you walk through a door, such as a raised step or threshold.  Trim any bushes or trees on the path to your home.  Regularly check to see if handrails are loose or broken. Make sure that both sides of any steps have handrails.  Install guardrails along the edges of any raised decks and porches.  Clear  walking paths of anything that might make someone trip, such as tools or rocks.  Have any leaves, snow, or ice cleared regularly.  Use sand or salt on walking paths during winter.  Clean up any spills in your garage right away. This includes grease or oil spills. What other actions can I take?  Wear shoes that: ? Have a low heel. Do not wear high heels. ? Have rubber bottoms. ? Are comfortable and fit you well. ? Are closed at the toe. Do not wear open-toe sandals.  Use tools that help you move around (mobility aids) if they are needed. These include: ? Canes. ? Walkers. ? Scooters. ? Crutches.  Review your medicines with your doctor. Some medicines can make you feel dizzy. This can increase your chance of falling. Ask your doctor what other things you can do to help prevent falls. Where to find more information  Centers for Disease Control and Prevention, [facility]: https://garcia.biz/  [employer] on Aging: BrainJudge.co.uk Contact a doctor if:  You are afraid of falling at home.  You feel weak, drowsy, or dizzy at home.  You fall at home. Summary  There are many simple things that you can do to make your home safe and to help prevent falls.  Ways to make your home safe include removing tripping hazards and installing grab bars in the bathroom.  Ask for help when making these changes in your home. This information is not intended to replace advice given to you by your health care provider. Make sure you discuss any questions you have with your health care provider. Document Released: 11/13/2008 Document Revised: 09/01/2016 Document Reviewed: 09/01/2016 Elsevier Interactive Patient Education  2019 [employer] Maintenance, Female Adopting a healthy lifestyle and getting preventive care can go a long way to promote health and wellness. Talk with your health care provider about what schedule of regular examinations is right for you. This is a good  chance for you to check in with your provider about disease prevention and staying healthy. In between checkups, there are plenty of things you can do on your own. Experts have done a lot of research about which lifestyle changes and preventive measures are most likely to keep you healthy. Ask your health care provider for more information. Weight and diet Eat a healthy diet  Be sure to include plenty of vegetables, fruits, low-fat dairy products, and lean protein.  Do not eat a lot of foods high in solid fats, added sugars, or salt.  Get  This is one of the most important things you can do for your health. ? Most adults should exercise for at least 150 minutes each week. The exercise should increase your heart rate and make you sweat (moderate-intensity exercise). ? Most adults should also do strengthening exercises at least twice a week. This is in addition to the moderate-intensity exercise. Maintain a healthy weight  Body mass index (BMI) is a measurement that can be used to identify possible weight problems. It estimates body fat based on height and weight. Your health care provider can help determine your BMI and help you achieve or maintain a healthy weight.  For females 20 years of age and older: ? A BMI below 18.5 is considered underweight. ? A BMI of 18.5 to 24.9 is normal. ? A BMI of 25 to 29.9 is considered overweight. ? A BMI of 30 and above is considered obese. Watch levels of cholesterol and blood lipids  You should start having your blood tested for lipids and cholesterol at 83 years of age, then have this test every 5 years.  You may need to have your cholesterol levels checked more often if: ? Your lipid or cholesterol levels are high. ? You are older than 83 years of age. ? You are at high risk for heart disease. Cancer screening Lung Cancer  Lung cancer screening is recommended for adults 55-80 years old who are at high risk for lung cancer because  of a history of smoking.  A yearly low-dose CT scan of the lungs is recommended for people who: ? Currently smoke. ? Have quit within the past 15 years. ? Have at least a 30-pack-year history of smoking. A pack year is smoking an average of one pack of cigarettes a day for 1 year.  Yearly screening should continue until it has been 15 years since you quit.  Yearly screening should stop if you develop a health problem that would prevent you from having lung cancer treatment. Breast Cancer  Practice breast self-awareness. This means understanding how your breasts normally appear and feel.  It also means doing regular breast self-exams. Let your health care provider know about any changes, no matter how small.  If you are in your 20s or 30s, you should have a clinical breast exam (CBE) by a health care provider every 1-3 years as part of a regular health exam.  If you are 40 or older, have a CBE every year. Also consider having a breast X-ray (mammogram) every year.  If you have a family history of breast cancer, talk to your health care provider about genetic screening.  If you are at high risk for breast cancer, talk to your health care provider about having an MRI and a mammogram every year.  Breast cancer gene (BRCA) assessment is recommended for women who have family members with BRCA-related cancers. BRCA-related cancers include: ? Breast. ? Ovarian. ? Tubal. ? Peritoneal cancers.  Results of the assessment will determine the need for genetic counseling and BRCA1 and BRCA2 testing. Cervical Cancer Your health care provider may recommend that you be screened regularly for cancer of the pelvic organs (ovaries, uterus, and vagina). This screening involves a pelvic examination, including checking for microscopic changes to the surface of your cervix (Pap test). You may be encouraged to have this screening done every 3 years, beginning at age 21.  For women ages 30-65, health care  providers may recommend pelvic exams and Pap testing every 3 years, or they may recommend   the Pap and pelvic exam, combined with testing for human papilloma virus (HPV), every 5 years. Some types of HPV increase your risk of cervical cancer. Testing for HPV may also be done on women of any age with unclear Pap test results.  Other health care providers may not recommend any screening for nonpregnant women who are considered low risk for pelvic cancer and who do not have symptoms. Ask your health care provider if a screening pelvic exam is right for you.  If you have had past treatment for cervical cancer or a condition that could lead to cancer, you need Pap tests and screening for cancer for at least 20 years after your treatment. If Pap tests have been discontinued, your risk factors (such as having a new sexual partner) need to be reassessed to determine if screening should resume. Some women have medical problems that increase the chance of getting cervical cancer. In these cases, your health care provider may recommend more frequent screening and Pap tests. Colorectal Cancer  This type of cancer can be detected and often prevented.  Routine colorectal cancer screening usually begins at 83 years of age and continues through 83 years of age.  Your health care provider may recommend screening at an earlier age if you have risk factors for colon cancer.  Your health care provider may also recommend using home test kits to check for hidden blood in the stool.  A small camera at the end of a tube can be used to examine your colon directly (sigmoidoscopy or colonoscopy). This is done to check for the earliest forms of colorectal cancer.  Routine screening usually begins at age 50.  Direct examination of the colon should be repeated every 5-10 years through 83 years of age. However, you may need to be screened more often if early forms of precancerous polyps or small growths are found. Skin Cancer   Check your skin from head to toe regularly.  Tell your health care provider about any new moles or changes in moles, especially if there is a change in a mole's shape or color.  Also tell your health care provider if you have a mole that is larger than the size of a pencil eraser.  Always use sunscreen. Apply sunscreen liberally and repeatedly throughout the day.  Protect yourself by wearing long sleeves, pants, a wide-brimmed hat, and sunglasses whenever you are outside. Heart disease, diabetes, and high blood pressure  High blood pressure causes heart disease and increases the risk of stroke. High blood pressure is more likely to develop in: ? People who have blood pressure in the high end of the normal range (130-139/85-89 mm Hg). ? People who are overweight or obese. ? People who are African American.  If you are 18-39 years of age, have your blood pressure checked every 3-5 years. If you are 40 years of age or older, have your blood pressure checked every year. You should have your blood pressure measured twice-once when you are at a hospital or clinic, and once when you are not at a hospital or clinic. Record the average of the two measurements. To check your blood pressure when you are not at a hospital or clinic, you can use: ? An automated blood pressure machine at a pharmacy. ? A home blood pressure monitor.  If you are between 55 years and 79 years old, ask your health care provider if you should take aspirin to prevent strokes.  Have regular diabetes screenings. This involves taking   involves taking a blood sample to check your fasting blood sugar level. ? If you are at a normal weight and have a low risk for diabetes, have this test once every three years after 83 years of age. ? If you are overweight and have a high risk for diabetes, consider being tested at a younger age or more often. Preventing infection Hepatitis B  If you have a higher risk for hepatitis B, you should be screened  for this virus. You are considered at high risk for hepatitis B if: ? You were born in a country where hepatitis B is common. Ask your health care provider which countries are considered high risk. ? Your parents were born in a high-risk country, and you have not been immunized against hepatitis B (hepatitis B vaccine). ? You have HIV or AIDS. ? You use needles to inject street drugs. ? You live with someone who has hepatitis B. ? You have had sex with someone who has hepatitis B. ? You get hemodialysis treatment. ? You take certain medicines for conditions, including cancer, organ transplantation, and autoimmune conditions. Hepatitis C  Blood testing is recommended for: ? Everyone born from [birth year] through 1963. ? Anyone with known risk factors for hepatitis C. Sexually transmitted infections (STIs)  You should be screened for sexually transmitted infections (STIs) including gonorrhea and chlamydia if: ? You are sexually active and are younger than 83 years of age. ? You are older than 83 years of age and your health care provider tells you that you are at risk for this type of infection. ? Your sexual activity has changed since you were last screened and you are at an increased risk for chlamydia or gonorrhea. Ask your health care provider if you are at risk.  If you do not have HIV, but are at risk, it may be recommended that you take a prescription medicine daily to prevent HIV infection. This is called pre-exposure prophylaxis (PrEP). You are considered at risk if: ? You are sexually active and do not regularly use condoms or know the HIV status of your partner(s). ? You take drugs by injection. ? You are sexually active with a partner who has HIV. Talk with your health care provider about whether you are at high risk of being infected with HIV. If you choose to begin PrEP, you should first be tested for HIV. You should then be tested every 3 months for as long as you are taking PrEP.  Pregnancy  If you are premenopausal and you may become pregnant, ask your health care provider about preconception counseling.  If you may become pregnant, take 400 to 800 micrograms (mcg) of folic acid every day.  If you want to prevent pregnancy, talk to your health care provider about birth control (contraception). Osteoporosis and menopause  Osteoporosis is a disease in which the bones lose minerals and strength with aging. This can result in serious bone fractures. Your risk for osteoporosis can be identified using a bone density scan.  If you are 16 years of age or older, or if you are at risk for osteoporosis and fractures, ask your health care provider if you should be screened.  Ask your health care provider whether you should take a calcium or vitamin D supplement to lower your risk for osteoporosis.  Menopause may have certain physical symptoms and risks.  Hormone replacement therapy may reduce some of these symptoms and risks. Talk to your health care provider about whether hormone replacement therapy  is right for you. Follow these instructions at home:  Schedule regular health, dental, and eye exams.  Stay current with your immunizations.  Do not use any tobacco products including cigarettes, chewing tobacco, or electronic cigarettes.  If you are pregnant, do not drink alcohol.  If you are breastfeeding, limit how much and how often you drink alcohol.  Limit alcohol intake to no more than 1 drink per day for nonpregnant women. One drink equals 12 ounces of beer, 5 ounces of wine, or 1 ounces of hard liquor.  Do not use street drugs.  Do not share needles.  Ask your health care provider for help if you need support or information about quitting drugs.  Tell your health care provider if you often feel depressed.  Tell your health care provider if you have ever been abused or do not feel safe at home. This information is not intended to replace advice given to  you by your health care provider. Make sure you discuss any questions you have with your health care provider. Document Released: 08/02/2010 Document Revised: 06/25/2015 Document Reviewed: 10/21/2014 Elsevier Interactive Patient Education  2019 [employer].

## 2018-07-13 NOTE — Progress Notes (Signed)
  I connected with patient 07/13/18 at  2:00 PM EDT by a video/audio enabled telemedicine application and verified that I am speaking with the correct person using two identifiers. Patient stated full name and DOB. Patient gave permission to continue with virtual visit. Patient's location was at home and Nurse's location was at [place] office.

## 2018-07-13 NOTE — Progress Notes (Addendum)
Subjective:   Tonya Johns is a 85 y.o. female who presents for Medicare Annual (Subsequent) preventive examination.  Review of Systems:  No ROS.  Medicare Wellness Virtual Visit.  Visual/audio telehealth visit, UTA vital signs.   See social history for additional risk factors.  Cardiac Risk Factors include: dyslipidemia;advanced age (>4055men, 77>65 women);obesity (BMI >30kg/m2);family history of premature cardiovascular disease   Sleep patterns: Sleeps 6 hours, feels rested most days.  Home Safety/Smoke Alarms: Feels safe in home. Smoke alarms in place.  Living environment; residence and Firearm Safety: Lives with son in 1 story home, steps at door with rail.  Seat Belt Safety/Bike Helmet: Wears seat belt.   Female:   Pap-2011      Mammo-Declines testing       Dexa scan-Declines testing       CCS-Aged out.      Objective:     Vitals: BP 140/70 Comment: Obtained by JBrodmerkel on 07/12/2018  Pulse 64   Temp 97.9 F (36.6 C)   Ht 5\' 4"  (1.626 m)   Wt 189 lb 4 oz (85.8 kg)   SpO2 97%   BMI 32.48 kg/m   Body mass index is 32.48 kg/m.  Advanced Directives 07/13/2018 03/23/2018 03/23/2017 01/09/2017 01/02/2017 09/05/2014  Does Patient Have a Medical Advance Directive? Yes Yes Yes No No No  Type of Estate agentAdvance Directive Healthcare Power of [place];Living will Healthcare Power of [place]orney Healthcare Power of [place]orney;Living will - - -  Copy of Healthcare Power of Attorney in Chart? No - copy requested - No - copy requested - - -  Would patient like information on creating a medical advance directive? - - - No - Patient declined Yes (MAU/Ambulatory/Procedural Areas - Information given) No - patient declined information    Tobacco Social History   Tobacco Use  Smoking Status Never Smoker  Smokeless Tobacco Never Used     Counseling given: Not Answered   Past Medical History:  Diagnosis Date  . Anemia   . Arthritis   . Diverticulitis   . GERD (gastroesophageal reflux disease)    . Hyperlipidemia   . Hypothyroidism   . Leg swelling   . Osteoporosis    Past Surgical History:  Procedure Laterality Date  . EYE SURGERY Bilateral    cataract removal  . NO PAST SURGERIES    . TOTAL KNEE ARTHROPLASTY Left 01/09/2017   Procedure: TOTAL KNEE ARTHROPLASTY;  Surgeon: Gean Birchwoodowan, Frank, MD;  [facility];  Service: Orthopedics;  Laterality: Left;   Family History  Problem Relation Age of Onset  . Alcohol abuse Brother   . Alcohol abuse Sister   . Arthritis Mother   . Stroke Mother   . Hypertension Mother   . Arthritis Father   . Heart disease Father   . Hyperlipidemia Brother   . Hyperlipidemia Sister   . Sudden death Brother   . Mental illness Sister   . Diabetes Sister   . Cancer Daughter        breast   Social History   Socioeconomic History  . Marital status: Single    Spouse name: Not on file  . Number of children: 3  . Years of education: Not on file  . Highest education level: Not on file  Occupational History  . Occupation: retired  Engineer, productionocial Needs  . Financial resource strain: Not on file  . Food insecurity    Worry: Not on file    Inability: Not on file  . Transportation needs  Medical: Not on file    Non-medical: Not on file  Tobacco Use  . Smoking status: Never Smoker  . Smokeless tobacco: Never Used  Substance and Sexual Activity  . Alcohol use: No  . Drug use: No  . Sexual activity: Not on file  Lifestyle  . Physical activity    Days per week: Not on file    Minutes per session: Not on file  . Stress: Not on file  Relationships  . Social Herbalist on phone: Not on file    Gets together: Not on file    Attends religious service: Not on file    Active member of club or organization: Not on file    Attends meetings of clubs or organizations: Not on file    Relationship status: Not on file  Other Topics Concern  . Not on file  Social History Narrative  . Not on file    Outpatient Encounter Medications as of  07/13/2018  Medication Sig  . Coenzyme Q10 (CO Q10) 100 MG CAPS Take 100 mg by mouth at bedtime.   . diclofenac sodium (VOLTAREN) 1 % GEL Apply 4 g topically 4 (four) times daily.  . famotidine (PEPCID) 20 MG tablet TAKE 1 TABLET BY MOUTH  DAILY  . levothyroxine (SYNTHROID, LEVOTHROID) 112 MCG tablet TAKE 1 TABLET BY MOUTH EVERY DAY  . Liniments (BLUE-EMU SUPER STRENGTH EX) Apply 1 application topically 2 (two) times daily. For knee pain  . Liniments (SALONPAS PAIN RELIEF PATCH EX) Apply 1 patch topically at bedtime.  . meclizine (ANTIVERT) 12.5 MG tablet Take 1 tablet (12.5 mg total) by mouth 3 (three) times daily as needed for dizziness.  . Multiple Vitamin (MULTIVITAMIN WITH MINERALS) TABS tablet Take 1 tablet by mouth at bedtime.  . ondansetron (ZOFRAN) 4 MG tablet Take 1 tablet (4 mg total) by mouth every 8 (eight) hours as needed for nausea or vomiting.  . pravastatin (PRAVACHOL) 80 MG tablet TAKE 1 TABLET BY MOUTH  DAILY (Patient taking differently: Take 80 mg by mouth at bedtime. )   No facility-administered encounter medications on file as of 07/13/2018.     Activities of Daily Living In your present state of health, do you have any difficulty performing the following activities: 07/13/2018 07/12/2018  Hearing? N N  Vision? N N  Difficulty concentrating or making decisions? N N  Walking or climbing stairs? Y Y  Dressing or bathing? N N  Doing errands, shopping? N Y  Conservation officer, nature and eating ? N -  Using the Toilet? N -  In the past six months, have you accidently leaked urine? N -  Do you have problems with loss of bowel control? N -  Managing your Medications? N -  Managing your Finances? N -  Housekeeping or managing your Housekeeping? N -  Some recent data might be hidden    Patient Care Team: Midge Minium, MD as PCP - General (Family Medicine) Renato Shin, MD as Consulting Physician (Endocrinology) Frederik Pear, MD as Consulting Physician (Orthopedic Surgery)     Assessment:   This is a routine wellness examination for [name].  Exercise Activities and Dietary recommendations Current Exercise Habits: The patient does not participate in regular exercise at present(gardening; housekeeping), Exercise limited by: None identified   Diet (meal preparation, eat out, water intake, caffeinated beverages, dairy products, fruits and vegetables): Drinks water, Glucerna  Eats heart healthy 2 meals/day.   Goals      Patient Stated   .  Stress managment (pt-stated)     Plans to achieve goal by stepping down from some of her church communities.        Other   . Patient Stated     Maintain current health by staying active.     . Weight (lb) < 175 lb (79.4 kg)     Lose weight by decreasing sweet snacks.     . Weight < 200 lb (90.719 kg)     Desires to lose 5-10 lbs by next year.    Plans to look into Silver Sneakers.   Healthy meals and snacks to include lean protein, fruits/veggies, whole grains, and limited sweets.         Fall Risk Fall Risk  07/13/2018 07/12/2018 03/29/2018 09/12/2017 03/23/2017  Falls in the past year? 0 0 0 No No  Number falls in past yr: - 0 - - -  Injury with Fall? - 0 - - -     Depression Screen PHQ 2/9 Scores 07/13/2018 07/12/2018 03/29/2018 09/12/2017  PHQ - 2 Score 0 0 0 0  PHQ- 9 Score - 0 0 0  Exception Documentation - - - -     Cognitive Function MMSE - Mini Mental State Exam 03/23/2017 09/05/2014  Orientation to time 5 5  Orientation to Place 5 5  Registration 3 3  Attention/ Calculation 4 5  Recall 3 2  Language- name 2 objects 2 2  Language- repeat 1 1  Language- follow 3 step command 3 3  Language- read & follow direction 1 1  Write a sentence 1 1  Copy design 0 1  Total score 28 29        Immunization History  Administered Date(s) Administered  . Influenza Split 11/25/2010, 11/16/2011  . Influenza,inj,Quad PF,6+ Mos 12/06/2012, 11/07/2013, 10/23/2014, 11/26/2015, 09/20/2016, 09/30/2017  .  Pneumococcal Conjugate-13 02/19/2014  . Pneumococcal Polysaccharide-23 03/10/2016  . Zoster Recombinat (Shingrix) 04/28/2017, 09/30/2017      Screening Tests Health Maintenance  Topic Date Due  . TETANUS/TDAP  03/30/2019 (Originally 05/15/1949)  . INFLUENZA VACCINE  09/01/2018  . DEXA SCAN  Completed  . PNA vac Low Risk Adult  Completed       Plan:    Bring a copy of your living will and/or healthcare power of attorney to your next office visit.  Continue doing brain stimulating activities (puzzles, reading, adult coloring books, staying active) to keep memory sharp.   I have personally reviewed and noted the following in the patient's chart:   . Medical and social history . Use of alcohol, tobacco or illicit drugs  . Current medications and supplements . Functional ability and status . Nutritional status . Physical activity . Advanced directives . List of other physicians . Hospitalizations, surgeries, and ER visits in previous 12 months . Vitals . Screenings to include cognitive, depression, and falls . Referrals and appointments  In addition, I have reviewed and discussed with patient certain preventive protocols, quality metrics, and best practice recommendations. A written personalized care plan for preventive services as well as general preventive health recommendations were provided to patient.     Alysia PennaKimberly R Tynesia Harral, RN  07/13/2018  Reviewed documentation provided by RN and agree w/ above.  Neena RhymesKatherine Tabori, MD

## 2018-08-14 ENCOUNTER — Ambulatory Visit (INDEPENDENT_AMBULATORY_CARE_PROVIDER_SITE_OTHER): Payer: Medicare Other

## 2018-08-14 ENCOUNTER — Other Ambulatory Visit: Payer: Self-pay

## 2018-08-14 DIAGNOSIS — E890 Postprocedural hypothyroidism: Secondary | ICD-10-CM

## 2018-08-14 LAB — TSH: TSH: 0.42 u[IU]/mL (ref 0.35–4.50)

## 2018-08-29 ENCOUNTER — Other Ambulatory Visit: Payer: Self-pay | Admitting: Family Medicine

## 2018-09-10 DIAGNOSIS — M1711 Unilateral primary osteoarthritis, right knee: Secondary | ICD-10-CM | POA: Diagnosis not present

## 2018-09-10 DIAGNOSIS — M25561 Pain in right knee: Secondary | ICD-10-CM | POA: Diagnosis not present

## 2018-09-10 DIAGNOSIS — M65312 Trigger thumb, left thumb: Secondary | ICD-10-CM | POA: Diagnosis not present

## 2018-10-12 ENCOUNTER — Encounter: Payer: Self-pay | Admitting: Family Medicine

## 2018-10-23 ENCOUNTER — Other Ambulatory Visit: Payer: Self-pay

## 2018-10-25 ENCOUNTER — Other Ambulatory Visit: Payer: Self-pay

## 2018-10-25 ENCOUNTER — Ambulatory Visit (INDEPENDENT_AMBULATORY_CARE_PROVIDER_SITE_OTHER): Payer: Medicare Other | Admitting: Endocrinology

## 2018-10-25 ENCOUNTER — Encounter: Payer: Self-pay | Admitting: Endocrinology

## 2018-10-25 VITALS — BP 138/70 | HR 79 | Ht 64.0 in | Wt 193.4 lb

## 2018-10-25 DIAGNOSIS — E890 Postprocedural hypothyroidism: Secondary | ICD-10-CM | POA: Diagnosis not present

## 2018-10-25 LAB — T4, FREE: Free T4: 1.14 ng/dL (ref 0.60–1.60)

## 2018-10-25 LAB — TSH: TSH: 1.57 u[IU]/mL (ref 0.35–4.50)

## 2018-10-25 NOTE — Patient Instructions (Signed)
blood tests are requested for you today.  We'll let you know about the results.  Please come back for a follow-up appointment in 1 year.   most of the time, a "lumpy thyroid" will eventually become overactive again.  When this happens, we would first notice that you would need less thyroid medication.

## 2018-10-25 NOTE — Progress Notes (Signed)
Subjective:    Patient ID: Tonya Johns, female    DOB: 1930-06-14, 85 y.o.   MRN: [id number]  HPI Pt returns for f/u of post-RAI hypothyroidism (in 2012, she had RAI, for hyperthyroidism due to multinodular goiter; she started on synthroid in 2013; [place] in 2016 was only minimally changed; she declines any more [place]).  She does not notice the goiter.  pt states she feels well in general. Past Medical History:  Diagnosis Date  . Anemia   . Arthritis   . Diverticulitis   . GERD (gastroesophageal reflux disease)   . Hyperlipidemia   . Hypothyroidism   . Leg swelling   . Osteoporosis     Past Surgical History:  Procedure Laterality Date  . EYE SURGERY Bilateral    cataract removal  . NO PAST SURGERIES    . TOTAL KNEE ARTHROPLASTY Left 01/09/2017   Procedure: TOTAL KNEE ARTHROPLASTY;  Surgeon: Frederik Pear, MD;  Location: [place];  Service: Orthopedics;  Laterality: Left;    Social History   Socioeconomic History  . Marital status: Single    Spouse name: Not on file  . Number of children: 3  . Years of education: Not on file  . Highest education level: Not on file  Occupational History  . Occupation: retired  Scientific laboratory technician  . Financial resource strain: Not on file  . Food insecurity    Worry: Not on file    Inability: Not on file  . Transportation needs    Medical: Not on file    Non-medical: Not on file  Tobacco Use  . Smoking status: Never Smoker  . Smokeless tobacco: Never Used  Substance and Sexual Activity  . Alcohol use: No  . Drug use: No  . Sexual activity: Not on file  Lifestyle  . Physical activity    Days per week: Not on file    Minutes per session: Not on file  . Stress: Not on file  Relationships  . Social Herbalist on phone: Not on file    Gets together: Not on file    Attends religious service: Not on file    Active member of club or organization: Not on file    Attends meetings of clubs or organizations: Not on file    Relationship  status: Not on file  . Intimate partner violence    Fear of current or ex partner: Not on file    Emotionally abused: Not on file    Physically abused: Not on file    Forced sexual activity: Not on file  Other Topics Concern  . Not on file  Social History Narrative  . Not on file    Current Outpatient Medications on File Prior to Visit  Medication Sig Dispense Refill  . Coenzyme Q10 (CO Q10) 100 MG CAPS Take 100 mg by mouth at bedtime.     . diclofenac sodium (VOLTAREN) 1 % GEL Apply 4 g topically 4 (four) times daily. 100 g 3  . famotidine (PEPCID) 20 MG tablet TAKE 1 TABLET BY MOUTH  DAILY 90 tablet 3  . levothyroxine (SYNTHROID) 100 MCG tablet Take 1 tablet (100 mcg total) by mouth daily. 90 tablet 1  . Liniments (BLUE-EMU SUPER STRENGTH EX) Apply 1 application topically 2 (two) times daily. For knee pain    . Liniments (SALONPAS PAIN RELIEF PATCH EX) Apply 1 patch topically at bedtime.    . Multiple Vitamin (MULTIVITAMIN WITH MINERALS) TABS tablet Take 1 tablet by  mouth at bedtime.    . pravastatin (PRAVACHOL) 80 MG tablet TAKE 1 TABLET BY MOUTH  DAILY 90 tablet 3   No current facility-administered medications on file prior to visit.     Allergies  Allergen Reactions  . Penicillins Rash    Did it involve swelling of the face/tongue/throat, SOB, or low BP? No Did it involve sudden or severe rash/hives, skin peeling, or any reaction on the inside of your mouth or nose? Yes Did you need to seek medical attention at a hospital or doctor's office? No When did it last happen?approx 2005 If all above answers are "NO", may proceed with cephalosporin use.     Family History  Problem Relation Age of Onset  . Alcohol abuse Brother   . Alcohol abuse Sister   . Arthritis Mother   . Stroke Mother   . Hypertension Mother   . Arthritis Father   . Heart disease Father   . Hyperlipidemia Brother   . Hyperlipidemia Sister   . Sudden death Brother   . Mental illness Sister   .  Diabetes Sister   . Cancer Daughter        breast    BP 138/70 (BP Location: Right Arm, Patient Position: Sitting, Cuff Size: Normal)   Pulse 79   Ht 5\' 4"  (1.626 m)   Wt 193 lb 6.4 oz (87.7 kg)   SpO2 97%   BMI 33.20 kg/m    Review of Systems Denies tremor.      Objective:   Physical Exam VITAL SIGNS:  See vs page GENERAL: no distress NECK: thyroid is slightly enlarged (R>L), with irreg surface. Lab Results  Component Value Date   TSH 1.57 10/25/2018        Assessment & Plan:  Hypothyroidism: well-replaced.  MNG: clinically stable.    Patient Instructions  blood tests are requested for you today.  We'll let you know about the results.  Please come back for a follow-up appointment in 1 year.   most of the time, a "lumpy thyroid" will eventually become overactive again.  When this happens, we would first notice that you would need less thyroid medication.

## 2018-11-23 ENCOUNTER — Other Ambulatory Visit: Payer: Self-pay | Admitting: Family Medicine

## 2018-11-23 NOTE — Telephone Encounter (Signed)
Please advise per chart pt was last on 128mcg

## 2019-01-01 ENCOUNTER — Other Ambulatory Visit: Payer: Self-pay | Admitting: General Practice

## 2019-01-01 MED ORDER — LEVOTHYROXINE SODIUM 100 MCG PO TABS: 100.0000 ug | ORAL_TABLET | Freq: Every day | ORAL | 1 refills | Status: DC

## 2019-01-07 ENCOUNTER — Ambulatory Visit (INDEPENDENT_AMBULATORY_CARE_PROVIDER_SITE_OTHER): Payer: Medicare Other | Admitting: Family Medicine

## 2019-01-07 ENCOUNTER — Other Ambulatory Visit: Payer: Self-pay

## 2019-01-07 ENCOUNTER — Encounter: Payer: Self-pay | Admitting: Family Medicine

## 2019-01-07 VITALS — BP 138/81 | HR 74 | Temp 98.0°F | Resp 17 | Ht 64.0 in | Wt 196.2 lb

## 2019-01-07 DIAGNOSIS — E890 Postprocedural hypothyroidism: Secondary | ICD-10-CM

## 2019-01-07 DIAGNOSIS — E785 Hyperlipidemia, unspecified: Secondary | ICD-10-CM

## 2019-01-07 DIAGNOSIS — E669 Obesity, unspecified: Secondary | ICD-10-CM | POA: Insufficient documentation

## 2019-01-07 DIAGNOSIS — E663 Overweight: Secondary | ICD-10-CM | POA: Insufficient documentation

## 2019-01-07 NOTE — Progress Notes (Signed)
   Subjective:    Patient ID: Tonya Johns, female    DOB: 01/04/1931, 85 y.o.   MRN: [id number]  HPI Hypothyroid- chronic problem, on Levothyroxine 168mcg daily.  Denies excessive fatigue.  No changes to skin/hair/nails.  Hyperlipidemia- chronic problem, on Pravastatin 80mg  daily.  No CP, SOB, abd pain, N/V.  Obesity- ongoing issue for pt, she has gained 7 lbs since last visit.  Unable to exercise due to chronic knee pain (OA)  Not following any particular diet.   Review of Systems For ROS see HPI   This visit occurred during the SARS-CoV-2 public health emergency.  Safety protocols were in place, including screening questions prior to the visit, additional usage of staff PPE, and extensive cleaning of exam room while observing appropriate contact time as indicated for disinfecting solutions.       Objective:   Physical Exam Vitals signs reviewed.  Constitutional:      General: She is not in acute distress.    Appearance: She is well-developed.  HENT:     Head: Normocephalic and atraumatic.  Eyes:     Conjunctiva/sclera: Conjunctivae normal.     Pupils: Pupils are equal, round, and reactive to light.  Neck:     Musculoskeletal: Normal range of motion and neck supple.     Thyroid: Thyromegaly (multinodular goiter) present.  Cardiovascular:     Rate and Rhythm: Normal rate and regular rhythm.     Heart sounds: Normal heart sounds. No murmur.  Pulmonary:     Effort: Pulmonary effort is normal. No respiratory distress.     Breath sounds: Normal breath sounds.  Abdominal:     General: There is no distension.     Palpations: Abdomen is soft.     Tenderness: There is no abdominal tenderness.  Lymphadenopathy:     Cervical: No cervical adenopathy.  Skin:    General: Skin is warm and dry.  Neurological:     Mental Status: She is alert and oriented to person, place, and time.  Psychiatric:        Behavior: Behavior normal.           Assessment & Plan:

## 2019-01-07 NOTE — Assessment & Plan Note (Signed)
Ongoing issue.  Pt not able to exercise due to chronic knee pain.  Did encourage her to work on healthy diet.  Will follow.

## 2019-01-07 NOTE — Assessment & Plan Note (Signed)
Chronic problem.  Currently asymptomatic.  Check labs.  Adjust meds prn  

## 2019-01-07 NOTE — Assessment & Plan Note (Signed)
Chronic problem.  On Pravastatin 80mg daily w/o difficulty.  Check labs.  Adjust meds prn  

## 2019-01-07 NOTE — Patient Instructions (Signed)
Schedule your complete physical in 6 months We'll notify you of your lab results and make any changes if needed Continue to work on healthy diet and regular exercise- you can do it! Call with any questions or concerns Stay Safe!  Stay Healthy! Happy Holidays!! 

## 2019-01-08 ENCOUNTER — Other Ambulatory Visit: Payer: Self-pay | Admitting: Family Medicine

## 2019-01-08 DIAGNOSIS — R7989 Other specified abnormal findings of blood chemistry: Secondary | ICD-10-CM

## 2019-01-08 LAB — HEPATIC FUNCTION PANEL
AG Ratio: 1.6 (calc) (ref 1.0–2.5)
ALT: 13 U/L (ref 6–29)
AST: 22 U/L (ref 10–35)
Albumin: 3.9 g/dL (ref 3.6–5.1)
Alkaline phosphatase (APISO): 82 U/L (ref 37–153)
Bilirubin, Direct: 0.1 mg/dL (ref 0.0–0.2)
Globulin: 2.4 g/dL (calc) (ref 1.9–3.7)
Indirect Bilirubin: 0.3 mg/dL (calc) (ref 0.2–1.2)
Total Bilirubin: 0.4 mg/dL (ref 0.2–1.2)
Total Protein: 6.3 g/dL (ref 6.1–8.1)

## 2019-01-08 LAB — CBC WITH DIFFERENTIAL/PLATELET
Absolute Monocytes: 383 cells/uL (ref 200–950)
Basophils Absolute: 59 cells/uL (ref 0–200)
Basophils Relative: 0.9 %
Eosinophils Absolute: 99 cells/uL (ref 15–500)
Eosinophils Relative: 1.5 %
HCT: 35.9 % (ref 35.0–45.0)
Hemoglobin: 12 g/dL (ref 11.7–15.5)
Lymphs Abs: 2204 cells/uL (ref 850–3900)
MCH: 30.5 pg (ref 27.0–33.0)
MCHC: 33.4 g/dL (ref 32.0–36.0)
MCV: 91.3 fL (ref 80.0–100.0)
MPV: 11.4 fL (ref 7.5–12.5)
Monocytes Relative: 5.8 %
Neutro Abs: 3854 cells/uL (ref 1500–7800)
Neutrophils Relative %: 58.4 %
Platelets: 221 10*3/uL (ref 140–400)
RBC: 3.93 10*6/uL (ref 3.80–5.10)
RDW: 13.2 % (ref 11.0–15.0)
Total Lymphocyte: 33.4 %
WBC: 6.6 10*3/uL (ref 3.8–10.8)

## 2019-01-08 LAB — BASIC METABOLIC PANEL
BUN/Creatinine Ratio: 13 (calc) (ref 6–22)
BUN: 17 mg/dL (ref 7–25)
CO2: 25 mmol/L (ref 20–32)
Calcium: 9.7 mg/dL (ref 8.6–10.4)
Chloride: 105 mmol/L (ref 98–110)
Creat: 1.3 mg/dL — ABNORMAL HIGH (ref 0.60–0.88)
Glucose, Bld: 100 mg/dL — ABNORMAL HIGH (ref 65–99)
Potassium: 4.6 mmol/L (ref 3.5–5.3)
Sodium: 139 mmol/L (ref 135–146)

## 2019-01-08 LAB — LIPID PANEL
Cholesterol: 199 mg/dL (ref <200)
HDL: 56 mg/dL (ref >=50)
LDL Cholesterol (Calc): 115 mg/dL (calc) — ABNORMAL HIGH
Non-HDL Cholesterol (Calc): 143 mg/dL (calc) — ABNORMAL HIGH (ref <130)
Total CHOL/HDL Ratio: 3.6 (calc) (ref <5.0)
Triglycerides: 166 mg/dL — ABNORMAL HIGH (ref <150)

## 2019-01-08 LAB — TSH: TSH: 0.58 mIU/L (ref 0.40–4.50)

## 2019-01-16 ENCOUNTER — Other Ambulatory Visit (INDEPENDENT_AMBULATORY_CARE_PROVIDER_SITE_OTHER): Payer: Medicare Other

## 2019-01-16 ENCOUNTER — Other Ambulatory Visit: Payer: Self-pay

## 2019-01-16 DIAGNOSIS — R7989 Other specified abnormal findings of blood chemistry: Secondary | ICD-10-CM | POA: Diagnosis not present

## 2019-01-16 LAB — BASIC METABOLIC PANEL
BUN: 20 mg/dL (ref 6–23)
CO2: 27 mEq/L (ref 19–32)
Calcium: 9.5 mg/dL (ref 8.4–10.5)
Chloride: 103 mEq/L (ref 96–112)
Creatinine, Ser: 1.35 mg/dL — ABNORMAL HIGH (ref 0.40–1.20)
GFR: 44.92 mL/min — ABNORMAL LOW (ref >60.00)
Glucose, Bld: 95 mg/dL (ref 70–99)
Potassium: 3.9 mEq/L (ref 3.5–5.1)
Sodium: 138 mEq/L (ref 135–145)

## 2019-01-17 ENCOUNTER — Encounter: Payer: Self-pay | Admitting: General Practice

## 2019-02-10 ENCOUNTER — Ambulatory Visit: Payer: Medicare Other | Attending: Internal Medicine

## 2019-02-10 DIAGNOSIS — Z23 Encounter for immunization: Secondary | ICD-10-CM | POA: Insufficient documentation

## 2019-02-10 NOTE — Progress Notes (Signed)
   Covid-19 Vaccination Clinic  Name:  Santosha Jividen    MRN: [id number] DOB: 01/18/1931  02/10/2019  Ms. Bin was observed post Covid-19 immunization for 15 minutes without incidence. She was provided with Vaccine Information Sheet and instruction to access the V-Safe system.   Ms. Mitchener was instructed to call 911 with any severe reactions post vaccine: Marland Kitchen Difficulty breathing  . Swelling of your face and throat  . A fast heartbeat  . A bad rash all over your body  . Dizziness and weakness    Immunizations Administered    Name Date Dose VIS Date Route   Pfizer COVID-19 Vaccine 02/10/2019  1:39 PM 0.3 mL 01/11/2019 Intramuscular   Manufacturer: [employer], [employer]   Lot: [id number]   NDC: [address]-6

## 2019-02-13 ENCOUNTER — Ambulatory Visit: Payer: Medicare Other

## 2019-02-14 ENCOUNTER — Ambulatory Visit: Payer: Medicare Other

## 2019-03-03 ENCOUNTER — Ambulatory Visit: Payer: Medicare Other | Attending: Internal Medicine

## 2019-03-03 ENCOUNTER — Ambulatory Visit: Payer: Self-pay

## 2019-03-03 DIAGNOSIS — Z23 Encounter for immunization: Secondary | ICD-10-CM | POA: Insufficient documentation

## 2019-03-03 NOTE — Progress Notes (Signed)
   Covid-19 Vaccination Clinic  Name:  Tonya Johns    MRN: [id number] DOB: 10/07/1930  03/03/2019  [name] was observed post Covid-19 immunization for 30 minutes based on pre-vaccination screening without incidence. She was provided with Vaccine Information Sheet and instruction to access the V-Safe system.   [name] was instructed to call 911 with any severe reactions post vaccine: Marland Kitchen Difficulty breathing  . Swelling of your face and throat  . A fast heartbeat  . A bad rash all over your body  . Dizziness and weakness    Immunizations Administered    Name Date Dose VIS Date Route   Pfizer COVID-19 Vaccine 03/03/2019 10:04 AM 0.3 mL 01/11/2019 Intramuscular   Manufacturer: [employer], [employer]   Lot: [id number]   NDC: [address]-9

## 2019-03-10 ENCOUNTER — Ambulatory Visit: Payer: Medicare Other

## 2019-06-06 ENCOUNTER — Other Ambulatory Visit: Payer: Self-pay | Admitting: Family Medicine

## 2019-07-15 ENCOUNTER — Encounter: Payer: Self-pay | Admitting: Family Medicine

## 2019-07-15 ENCOUNTER — Other Ambulatory Visit: Payer: Self-pay

## 2019-07-15 ENCOUNTER — Ambulatory Visit (INDEPENDENT_AMBULATORY_CARE_PROVIDER_SITE_OTHER): Payer: Medicare Other | Admitting: Family Medicine

## 2019-07-15 ENCOUNTER — Other Ambulatory Visit: Payer: Self-pay | Admitting: General Practice

## 2019-07-15 ENCOUNTER — Telehealth: Payer: Self-pay | Admitting: Family Medicine

## 2019-07-15 VITALS — BP 134/83 | HR 69 | Temp 97.8°F | Resp 16 | Ht 64.0 in | Wt 198.0 lb

## 2019-07-15 DIAGNOSIS — E669 Obesity, unspecified: Secondary | ICD-10-CM

## 2019-07-15 DIAGNOSIS — Z0000 Encounter for general adult medical examination without abnormal findings: Secondary | ICD-10-CM | POA: Diagnosis not present

## 2019-07-15 DIAGNOSIS — F329 Major depressive disorder, single episode, unspecified: Secondary | ICD-10-CM | POA: Diagnosis not present

## 2019-07-15 DIAGNOSIS — E785 Hyperlipidemia, unspecified: Secondary | ICD-10-CM

## 2019-07-15 DIAGNOSIS — E890 Postprocedural hypothyroidism: Secondary | ICD-10-CM

## 2019-07-15 DIAGNOSIS — F32A Depression, unspecified: Secondary | ICD-10-CM

## 2019-07-15 MED ORDER — FLUOXETINE HCL 10 MG PO CAPS: 10.0000 mg | ORAL_CAPSULE | Freq: Every day | ORAL | 3 refills | Status: DC

## 2019-07-15 MED ORDER — SERTRALINE HCL 25 MG PO TABS: 25.0000 mg | ORAL_TABLET | Freq: Every day | ORAL | 3 refills | Status: DC

## 2019-07-15 NOTE — Assessment & Plan Note (Signed)
Deteriorated.  Pt is struggling w/ her son who has multiple medical issues and lives w/ her b/c he is unable to care for himself.  This is understandably difficult for her.  She was previously on Sertraline and tolerated w/o difficulty.  Restart at 25mg  daily and monitor closely.  Pt expressed understanding and is in agreement w/ plan.

## 2019-07-15 NOTE — Patient Instructions (Signed)
Follow up in 1 month to recheck mood We'll notify you of your lab results and make any changes if needed START the Sertraline once daily to help w/ mood Go to the pharmacy for your tetanus shot Continue to work on healthy diet and regular exercise- you can do it! Call and schedule an orthopedic appt for your knee Call with any questions or concerns Hang in there!!!

## 2019-07-15 NOTE — Progress Notes (Signed)
   Subjective:    Patient ID: Tonya Johns, female    DOB: [birth year], 86 y.o.   MRN: [id number]  HPI CPE- UTD on Pneumonia vaccines, Flu.  Due for Tdap- pt to get at pharmacy.  She is no longer doing colon cancer screening or breast cancer screening.   Review of Systems Patient reports no vision/ hearing changes, adenopathy,fever, weight change,  persistant/recurrent hoarseness , swallowing issues, chest pain, palpitations, edema, persistant/recurrent cough, hemoptysis, dyspnea (rest/exertional/paroxysmal nocturnal), gastrointestinal bleeding (melena, rectal bleeding), abdominal pain, significant heartburn, bowel changes, GU symptoms (dysuria, hematuria, incontinence), Gyn symptoms (abnormal  bleeding, pain),  syncope, focal weakness, memory loss, numbness & tingling, skin/hair/nail changes, abnormal bruising or bleeding.   + depression- pt lives w/ 25 yr old son who just got out of hospital.  He is now on O2 and very limited in what he can do for himself.  This has pt understandably depressed.  This visit occurred during the SARS-CoV-2 public health emergency.  Safety protocols were in place, including screening questions prior to the visit, additional usage of staff PPE, and extensive cleaning of exam room while observing appropriate contact time as indicated for disinfecting solutions.       Objective:   Physical Exam General Appearance:    Alert, cooperative, no distress, appears stated age  Head:    Normocephalic, without obvious abnormality, atraumatic  Eyes:    PERRL, conjunctiva/corneas clear, EOM's intact, fundi    benign, both eyes  Ears:    Normal TM's and external ear canals, both ears  Nose:   Deferred due to COVID  Throat:   Neck:   Supple, symmetrical, trachea midline, no adenopathy;    Thyroid: + multinodular goiter  Back:     Symmetric, no curvature, ROM normal, no CVA tenderness  Lungs:     Clear to auscultation bilaterally, respirations unlabored  Chest Wall:    No  tenderness or deformity   Heart:    Regular rate and rhythm, S1 and S2 normal, no murmur, rub   or gallop  Breast Exam:    Deferred  Abdomen:     Soft, non-tender, bowel sounds active all four quadrants,    no masses, no organomegaly.  obese  Genitalia:    Deferred  Rectal:    Extremities:   Extremities normal, atraumatic, no cyanosis or edema  Pulses:   2+ and symmetric all extremities  Skin:   Skin color, texture, turgor normal, no rashes or lesions  Lymph nodes:   Cervical, supraclavicular, and axillary nodes normal  Neurologic:   CNII-XII intact, normal strength, sensation and reflexes    throughout          Assessment & Plan:

## 2019-07-15 NOTE — Telephone Encounter (Signed)
Medication filled to pharmacy as requested.   

## 2019-07-15 NOTE — Assessment & Plan Note (Signed)
Pt's PE unchanged from previous and WNL w/ exception of obesity, multinodular goiter.  No longer doing colonoscopy or mammogram.  She plans to get Tdap at pharmacy.  Check labs.  Anticipatory guidance provided.

## 2019-07-15 NOTE — Assessment & Plan Note (Addendum)
Chronic problem.  Check labs.  Adjust meds prn  

## 2019-07-15 NOTE — Assessment & Plan Note (Signed)
Chronic problem, on Pravastatin daily.  Check labs and adjust tx prn.

## 2019-07-15 NOTE — Telephone Encounter (Signed)
Pt's daughter called in stating that a prescription for fluoxetine was to be sent in but per the AVS it says start  Sertraline, please advise on the medication.

## 2019-07-15 NOTE — Assessment & Plan Note (Signed)
Ongoing issue for pt.  She is limited in her ability to exercise due to severe knee arthritis that she was seeing Ortho for and they recommended knee replacement.  Encouraged healthy diet.  Will follow.

## 2019-07-16 ENCOUNTER — Encounter: Payer: Self-pay | Admitting: General Practice

## 2019-07-16 LAB — CBC WITH DIFFERENTIAL/PLATELET
Basophils Absolute: 0.1 10*3/uL (ref 0.0–0.1)
Basophils Relative: 1.3 % (ref 0.0–3.0)
Eosinophils Absolute: 0.1 10*3/uL (ref 0.0–0.7)
Eosinophils Relative: 1.2 % (ref 0.0–5.0)
HCT: 34.5 % — ABNORMAL LOW (ref 36.0–46.0)
Hemoglobin: 11.6 g/dL — ABNORMAL LOW (ref 12.0–15.0)
Lymphocytes Relative: 27.1 % (ref 12.0–46.0)
Lymphs Abs: 1.9 10*3/uL (ref 0.7–4.0)
MCHC: 33.5 g/dL (ref 30.0–36.0)
MCV: 91.9 fl (ref 78.0–100.0)
Monocytes Absolute: 0.5 10*3/uL (ref 0.1–1.0)
Monocytes Relative: 6.6 % (ref 3.0–12.0)
Neutro Abs: 4.5 10*3/uL (ref 1.4–7.7)
Neutrophils Relative %: 63.8 % (ref 43.0–77.0)
Platelets: 205 10*3/uL (ref 150.0–400.0)
RBC: 3.76 Mil/uL — ABNORMAL LOW (ref 3.87–5.11)
RDW: 13.6 % (ref 11.5–15.5)
WBC: 7 10*3/uL (ref 4.0–10.5)

## 2019-07-16 LAB — BASIC METABOLIC PANEL
BUN: 19 mg/dL (ref 6–23)
CO2: 26 mEq/L (ref 19–32)
Calcium: 9.4 mg/dL (ref 8.4–10.5)
Chloride: 105 mEq/L (ref 96–112)
Creatinine, Ser: 1.27 mg/dL — ABNORMAL HIGH (ref 0.40–1.20)
GFR: 48.14 mL/min — ABNORMAL LOW (ref >60.00)
Glucose, Bld: 140 mg/dL — ABNORMAL HIGH (ref 70–99)
Potassium: 4 mEq/L (ref 3.5–5.1)
Sodium: 138 mEq/L (ref 135–145)

## 2019-07-16 LAB — HEPATIC FUNCTION PANEL
ALT: 10 U/L (ref 0–35)
AST: 15 U/L (ref 0–37)
Albumin: 4.1 g/dL (ref 3.5–5.2)
Alkaline Phosphatase: 88 U/L (ref 39–117)
Bilirubin, Direct: 0.1 mg/dL (ref 0.0–0.3)
Total Bilirubin: 0.4 mg/dL (ref 0.2–1.2)
Total Protein: 6.4 g/dL (ref 6.0–8.3)

## 2019-07-16 LAB — LIPID PANEL
Cholesterol: 189 mg/dL (ref 0–200)
HDL: 62.2 mg/dL (ref >39.00)
LDL Cholesterol: 109 mg/dL — ABNORMAL HIGH (ref 0–99)
NonHDL: 126.57
Total CHOL/HDL Ratio: 3
Triglycerides: 86 mg/dL (ref 0.0–149.0)
VLDL: 17.2 mg/dL (ref 0.0–40.0)

## 2019-07-16 LAB — TSH: TSH: 1.1 u[IU]/mL (ref 0.35–4.50)

## 2019-07-19 ENCOUNTER — Other Ambulatory Visit: Payer: Self-pay | Admitting: Physician Assistant

## 2019-08-06 ENCOUNTER — Other Ambulatory Visit: Payer: Self-pay | Admitting: Family Medicine

## 2019-08-12 NOTE — Progress Notes (Signed)
Subjective:   Tonya Johns is a 86 y.o. female who presents for an Initial Medicare Annual Wellness Visit.  Review of Systems      Cardiac Risk Factors include: dyslipidemia;obesity (BMI >30kg/m2)     Objective:    Today's Vitals   08/13/19 1158  BP: 130/83  Pulse: 78  Resp: 16  Temp: 97.9 F (36.6 C)  SpO2: 98%  Weight: 196 lb (88.9 kg)  Height: 5\' 4"  (1.626 m)  PainSc: 7    Body mass index is 33.64 kg/m.  Advanced Directives 08/13/2019 07/13/2018 03/23/2018 03/23/2017 01/09/2017 01/02/2017 09/05/2014  Does Patient Have a Medical Advance Directive? Yes Yes Yes Yes No No No  Type of Advance Directive Living will;Healthcare Power of 11/05/2014 Power of [place];Living will Healthcare Power of [place];Living will - - -  Copy of Healthcare Power of Attorney in Chart? No - copy requested No - copy requested - No - copy requested - - -  Would patient like information on creating a medical advance directive? - - - - No - Patient declined Yes (MAU/Ambulatory/Procedural Areas - Information given) No - patient declined information    Current Medications (verified) Outpatient Encounter Medications as of 08/13/2019  Medication Sig  . Coenzyme Q10 (CO Q10) 100 MG CAPS Take 100 mg by mouth at bedtime.   . famotidine (PEPCID) 20 MG tablet TAKE 1 TABLET BY MOUTH  DAILY  . FLUoxetine (PROZAC) 10 MG capsule Take 1 capsule (10 mg total) by mouth daily.  08/15/2019 levothyroxine (SYNTHROID) 100 MCG tablet TAKE 1 TABLET BY MOUTH  DAILY  . Liniments (BLUE-EMU SUPER STRENGTH EX) Apply 1 application topically 2 (two) times daily. For knee pain  . Liniments (SALONPAS PAIN RELIEF PATCH EX) Apply 1 patch topically at bedtime.  . Multiple Vitamin (MULTIVITAMIN WITH MINERALS) TABS tablet Take 1 tablet by mouth at bedtime.  . pravastatin (PRAVACHOL) 80 MG tablet TAKE 1 TABLET BY MOUTH  DAILY   No facility-administered encounter medications on file as of 08/13/2019.     Allergies (verified) Penicillins   History: Past Medical History:  Diagnosis Date  . Anemia   . Arthritis   . Diverticulitis   . GERD (gastroesophageal reflux disease)   . Hyperlipidemia   . Hypothyroidism   . Leg swelling   . Osteoporosis    Past Surgical History:  Procedure Laterality Date  . EYE SURGERY Bilateral    cataract removal  . NO PAST SURGERIES    . TOTAL KNEE ARTHROPLASTY Left 01/09/2017   Procedure: TOTAL KNEE ARTHROPLASTY;  Surgeon: 14/10/2016, MD;  Location: [facility];  Service: Orthopedics;  Laterality: Left;   Family History  Problem Relation Age of Onset  . Alcohol abuse Brother   . Alcohol abuse Sister   . Arthritis Mother   . Stroke Mother   . Hypertension Mother   . Arthritis Father   . Heart disease Father   . Hyperlipidemia Brother   . Hyperlipidemia Sister   . Sudden death Brother   . Mental illness Sister   . Diabetes Sister   . Cancer Daughter        breast   Social History   Socioeconomic History  . Marital status: Single    Spouse name: Not on file  . Number of children: 3  . Years of education: Not on file  . Highest education level: Not on file  Occupational History  . Occupation: retired  Tobacco Use  . Smoking status: Never Smoker  .  Smokeless tobacco: Never Used  Vaping Use  . Vaping Use: Never used  Substance and Sexual Activity  . Alcohol use: No  . Drug use: No  . Sexual activity: Not on file  Other Topics Concern  . Not on file  Social History Narrative  . Not on file   Social Determinants of Health   Financial Resource Strain: Low Risk   . Difficulty of Paying Living Expenses: Not hard at all  Food Insecurity: No Food Insecurity  . Worried About Programme researcher, broadcasting/film/video in the Last Year: Never true  . Ran Out of Food in the Last Year: Never true  Transportation Needs: No Transportation Needs  . Lack of Transportation (Medical): No  . Lack of Transportation (Non-Medical): No  Physical Activity: Inactive  .  Days of Exercise per Week: 0 days  . Minutes of Exercise per Session: 0 min  Stress: No Stress Concern Present  . Feeling of Stress : Not at all  Social Connections: Moderately Integrated  . Frequency of Communication with Friends and Family: More than three times a week  . Frequency of Social Gatherings with Friends and Family: More than three times a week  . Attends Religious Services: More than 4 times per year  . Active Member of Clubs or Organizations: Yes  . Attends Banker Meetings: More than 4 times per year  . Marital Status: Never married    Tobacco Counseling Counseling given: Not Answered   Clinical Intake:  Pre-visit preparation completed: Yes  Pain : 0-10 Pain Score: 7  Pain Type: Chronic pain Pain Location: Knee Pain Orientation: Right Pain Onset: More than a month ago Pain Frequency: Constant     Nutritional Status: BMI > 30  Obese Nutritional Risks: None Diabetes: No  How often do you need to have someone help you when you read instructions, pamphlets, or other written materials from your doctor or pharmacy?: 1 - Never What is the last grade level you completed in school?: GED  Interpreter Needed?: No  Information entered by :: Thomasenia Sales LPN   Activities of Daily Living In your present state of health, do you have any difficulty performing the following activities: 08/13/2019 08/13/2019  Hearing? N N  Vision? N N  Difficulty concentrating or making decisions? N N  Walking or climbing stairs? N N  Dressing or bathing? N N  Doing errands, shopping? N N  Preparing Food and eating ? N -  Using the Toilet? N -  In the past six months, have you accidently leaked urine? N -  Do you have problems with loss of bowel control? N -  Managing your Medications? N -  Managing your Finances? N -  Housekeeping or managing your Housekeeping? N -  Some recent data might be hidden     Immunizations and Health Maintenance Immunization History   Administered Date(s) Administered  . Fluad Quad(high Dose 65+) 10/02/2018  . Influenza Split 11/25/2010, 11/16/2011  . Influenza,inj,Quad PF,6+ Mos 12/06/2012, 11/07/2013, 10/23/2014, 11/26/2015, 09/20/2016, 09/30/2017  . PFIZER SARS-COV-2 Vaccination 02/10/2019, 03/03/2019  . Pneumococcal Conjugate-13 02/19/2014  . Pneumococcal Polysaccharide-23 03/10/2016  . Tdap 07/15/2019  . Zoster Recombinat (Shingrix) 04/28/2017, 09/30/2017   There are no preventive care reminders to display for this patient.  Patient Care Team: Sheliah Hatch, MD as PCP - General (Family Medicine) Romero Belling, MD as Consulting Physician (Endocrinology) Gean Birchwood, MD as Consulting Physician (Orthopedic Surgery)  Indicate any recent Medical Services you may have received from  other than [facility] providers in the past year (date may be approximate).     Assessment:   This is a routine wellness examination for Tonya Johns.  Hearing/Vision screen  Hearing Screening   125Hz  250Hz  500Hz  1000Hz  2000Hz  3000Hz  4000Hz  6000Hz  8000Hz   Right ear:           Left ear:           Comments: No issues  Vision Screening Comments: Last eye exam 03/2019-Sees [facility]  Dietary issues and exercise activities discussed: Current Exercise Habits: Home exercise routine, Type of exercise: stretching, Time (Minutes): 25, Frequency (Times/Week): 7, Weekly Exercise (Minutes/Week): 175, Intensity: Mild  Goals Addressed            This Visit's Progress   . Patient Stated   On track    Maintain current health by staying active.       Depression Screen PHQ 2/9 Scores 08/13/2019 08/13/2019 07/15/2019 01/07/2019 07/13/2018 07/12/2018 03/29/2018  PHQ - 2 Score 1 2 0 0 0 0 0  PHQ- 9 Score - 6 0 0 - 0 0  Exception Documentation - - - - - - -    Fall Risk Fall Risk  08/13/2019 08/13/2019 07/15/2019 07/15/2019 01/07/2019  Falls in the past year? 0 0 0 0 0  Number falls in past yr: 0 0 0 0 0  Injury with Fall? 0 0 0 0 0  Follow up Falls  prevention discussed Falls evaluation completed Falls evaluation completed Falls evaluation completed Falls evaluation completed   FALL RISK PREVENTION PERTAINING TO THE HOME:  Any stairs in or around the home? Yes  If so, are there any without handrails? No   Home free of loose throw rugs in walkways, pet beds, electrical cords, etc? Yes  Adequate lighting in your home to reduce risk of falls? Yes   ASSISTIVE DEVICES UTILIZED TO PREVENT FALLS:  Life alert? No  Use of a cane, walker or w/c? Yes  Grab bars in the bathroom? Yes  Shower chair or bench in shower? No  Elevated toilet seat or a handicapped toilet? No    DME ORDERS:  DME order needed?  No   TIMED UP AND GO:  Was the test performed? Yes .  Length of time to ambulate 10 feet: 12 sec.   GAIT:  Appearance of gait: Gait slow and steady without the use of an assistive device.  Education: Fall risk prevention has been discussed.  Intervention(s) required? No   DME/home health order needed?  No     Cognitive Function: MMSE - Mini Mental State Exam 07/13/2018 03/23/2017 09/05/2014  Not completed: Unable to complete - -  Orientation to time - 5 5  Orientation to Place - 5 5  Registration - 3 3  Attention/ Calculation - 4 5  Recall - 3 2  Language- name 2 objects - 2 2  Language- repeat - 1 1  Language- follow 3 step command - 3 3  Language- read & follow direction - 1 1  Write a sentence - 1 1  Copy design - 0 1  Total score - 28 29     6CIT Screen 08/13/2019  What Year? 0 points  What month? 0 points  What time? 0 points  Count back from 20 0 points  Months in reverse 0 points  Repeat phrase 2 points  Total Score 2    Screening Tests Health Maintenance  Topic Date Due  . INFLUENZA VACCINE  09/01/2019  . TETANUS/TDAP  07/14/2029  . DEXA SCAN  Completed  . COVID-19 Vaccine  Completed  . PNA vac Low Risk Adult  Completed    Qualifies for Shingles Vaccine? Up to date  Tdap:Up to date  Flu Vaccine:  Due 10/2019  Pneumococcal Vaccine: Up to date  Covid-19 Vaccine:  Completed vaccines  Cancer Screenings:  Colorectal Screening:  No longer required.  Mammogram:  No longer required.   Bone Density: Completed 09/12/2013. Results reflect NORMAL Repeat every 2 years. Patient declined at this time  Lung Cancer Screening: (Low Dose CT Chest recommended if Age 68-80 years, 30 pack-year currently smoking OR have quit w/in 15years.) does not qualify.    Additional Screening:  Hepatitis C Screening: does not qualify  Vision Screening: Recommended annual ophthalmology exams for early detection of glaucoma and other disorders of the eye. Is the patient up to date with their annual eye exam?  Yes  Who is the provider or what is the name of the office in which the pt attends annual eye exams? [facility]   Dental Screening: Recommended annual dental exams for proper oral hygiene  Community Resource Referral:  CRR required this visit?  No       Plan:  I have personally reviewed and addressed the Medicare Annual Wellness questionnaire and have noted the following in the patient's chart:  A. Medical and social history B. Use of alcohol, tobacco or illicit drugs  C. Current medications and supplements D. Functional ability and status E.  Nutritional status F.  Physical activity G. Advance directives H. List of other physicians I.  Hospitalizations, surgeries, and ER visits in previous 12 months J.  Vitals K. Screenings such as hearing and vision if needed, cognitive and depression L. Referrals and appointments   In addition, I have reviewed and discussed with patient certain preventive protocols, quality metrics, and best practice recommendations. A written personalized care plan for preventive services as well as general preventive health recommendations were provided to patient.  Patient to access AVS via mychart.   Signed,    Roanna RaiderMartha A Regina Coppolino, LPN   7/82/95627/13/2021  Nurse Health  Advisor   Nurse Notes: None

## 2019-08-13 ENCOUNTER — Encounter: Payer: Self-pay | Admitting: Family Medicine

## 2019-08-13 ENCOUNTER — Ambulatory Visit (INDEPENDENT_AMBULATORY_CARE_PROVIDER_SITE_OTHER): Payer: Medicare Other | Admitting: Family Medicine

## 2019-08-13 ENCOUNTER — Ambulatory Visit: Payer: Medicare Other

## 2019-08-13 ENCOUNTER — Ambulatory Visit (INDEPENDENT_AMBULATORY_CARE_PROVIDER_SITE_OTHER): Payer: Medicare Other

## 2019-08-13 ENCOUNTER — Other Ambulatory Visit: Payer: Self-pay

## 2019-08-13 VITALS — BP 130/83 | HR 78 | Temp 97.9°F | Resp 16 | Ht 64.0 in | Wt 196.0 lb

## 2019-08-13 VITALS — BP 130/83 | HR 78 | Temp 97.9°F | Resp 16 | Ht 64.0 in | Wt 196.2 lb

## 2019-08-13 DIAGNOSIS — Z0000 Encounter for general adult medical examination without abnormal findings: Secondary | ICD-10-CM | POA: Diagnosis not present

## 2019-08-13 DIAGNOSIS — F32A Depression, unspecified: Secondary | ICD-10-CM

## 2019-08-13 DIAGNOSIS — F329 Major depressive disorder, single episode, unspecified: Secondary | ICD-10-CM

## 2019-08-13 NOTE — Assessment & Plan Note (Signed)
Pt feels that mood has improved since starting the Fluoxetine.  She is not interested in increasing the dose at this time.  She feels that she is better able to handle some of the family stressors she is dealing with.  Will continue to follow and pt is aware that if things change, she can reach out and we can make adjustments.

## 2019-08-13 NOTE — Progress Notes (Signed)
   Subjective:    Patient ID: Tonya Johns, female    DOB: Apr 24, 1930, 86 y.o.   MRN: [id number]  HPI Depression- pt was started on Sertraline 25mg  at last visit due to stress w/ her son.  She had already taken the Sertraline so we switched to Fluoxetine 10mg  daily.  PHQ9 = 6 today.  Pt feels things are 'a little bit better'.  Pt feels like 'right now i'm in a pretty good place'.  Pt finds it's easier to handle some of the things she is dealing with.  Is also relying on prayer.  Pt is not interested in changing meds at this time.   Review of Systems For ROS see HPI   This visit occurred during the SARS-CoV-2 public health emergency.  Safety protocols were in place, including screening questions prior to the visit, additional usage of staff PPE, and extensive cleaning of exam room while observing appropriate contact time as indicated for disinfecting solutions.       Objective:   Physical Exam Vitals reviewed.  Constitutional:      General: She is not in acute distress.    Appearance: Normal appearance. She is not ill-appearing.  HENT:     Head: Normocephalic and atraumatic.  Neurological:     General: No focal deficit present.     Mental Status: She is alert and oriented to person, place, and time.  Psychiatric:        Mood and Affect: Mood normal.        Behavior: Behavior normal.        Thought Content: Thought content normal.           Assessment & Plan:

## 2019-08-13 NOTE — Patient Instructions (Signed)
Tonya Johns , Thank you for taking time to come for your Medicare Wellness Visit. I appreciate your ongoing commitment to your health goals. Please review the following plan we discussed and let me know if I can assist you in the future.   Screening recommendations/referrals: Colonoscopy: No longer indicated Mammogram: No longer indicated Bone Density: Declined at this time Recommended yearly ophthalmology/optometry visit for glaucoma screening and checkup Recommended yearly dental visit for hygiene and checkup  Vaccinations: Influenza vaccine: Due 10/2019 Pneumococcal vaccine: Completed vaccines Tdap vaccine: Up to date Due 07/14/2029 Shingles vaccine: Completed vaccines  Covid-19:Completed vaccines  Advanced directives: Please bring a copy to your next office visit.  Conditions/risks identified: see problem list  Next appointment: Follow up in one year for your annual wellness visit    Preventive Care 65 Years and Older, Female Preventive care refers to lifestyle choices and visits with your health care provider that can promote health and wellness. What does preventive care include?  A yearly physical exam. This is also called an annual well check.  Dental exams once or twice a year.  Routine eye exams. Ask your health care provider how often you should have your eyes checked.  Personal lifestyle choices, including:  Daily care of your teeth and gums.  Regular physical activity.  Eating a healthy diet.  Avoiding tobacco and drug use.  Limiting alcohol use.  Practicing safe sex.  Taking low-dose aspirin every day.  Taking vitamin and mineral supplements as recommended by your health care provider. What happens during an annual well check? The services and screenings done by your health care provider during your annual well check will depend on your age, overall health, lifestyle risk factors, and family history of disease. Counseling  Your health care provider may  ask you questions about your:  Alcohol use.  Tobacco use.  Drug use.  Emotional well-being.  Home and relationship well-being.  Sexual activity.  Eating habits.  History of falls.  Memory and ability to understand (cognition).  Work and work Astronomer.  Reproductive health. Screening  You may have the following tests or measurements:  Height, weight, and BMI.  Blood pressure.  Lipid and cholesterol levels. These may be checked every 5 years, or more frequently if you are over 18 years old.  Skin check.  Lung cancer screening. You may have this screening every year starting at age 83 if you have a 30-pack-year history of smoking and currently smoke or have quit within the past 15 years.  Fecal occult blood test (FOBT) of the stool. You may have this test every year starting at age 84.  Flexible sigmoidoscopy or colonoscopy. You may have a sigmoidoscopy every 5 years or a colonoscopy every 10 years starting at age 30.  Hepatitis C blood test.  Hepatitis B blood test.  Sexually transmitted disease (STD) testing.  Diabetes screening. This is done by checking your blood sugar (glucose) after you have not eaten for a while (fasting). You may have this done every 1-3 years.  Bone density scan. This is done to screen for osteoporosis. You may have this done starting at age 58.  Mammogram. This may be done every 1-2 years. Talk to your health care provider about how often you should have regular mammograms. Talk with your health care provider about your test results, treatment options, and if necessary, the need for more tests. Vaccines  Your health care provider may recommend certain vaccines, such as:  Influenza vaccine. This is recommended every year.  Tetanus, diphtheria, and acellular pertussis (Tdap, Td) vaccine. You may need a Td booster every 10 years.  Zoster vaccine. You may need this after age 27.  Pneumococcal 13-valent conjugate (PCV13) vaccine. One  dose is recommended after age 48.  Pneumococcal polysaccharide (PPSV23) vaccine. One dose is recommended after age 72. Talk to your health care provider about which screenings and vaccines you need and how often you need them. This information is not intended to replace advice given to you by your health care provider. Make sure you discuss any questions you have with your health care provider. Document Released: 02/13/2015 Document Revised: 10/07/2015 Document Reviewed: 11/18/2014 Elsevier Interactive Patient Education  2017 [place] Prevention in the Home Falls can cause injuries. They can happen to people of all ages. There are many things you can do to make your home safe and to help prevent falls. What can I do on the outside of my home?  Regularly fix the edges of walkways and driveways and fix any cracks.  Remove anything that might make you trip as you walk through a door, such as a raised step or threshold.  Trim any bushes or trees on the path to your home.  Use bright outdoor lighting.  Clear any walking paths of anything that might make someone trip, such as rocks or tools.  Regularly check to see if handrails are loose or broken. Make sure that both sides of any steps have handrails.  Any raised decks and porches should have guardrails on the edges.  Have any leaves, snow, or ice cleared regularly.  Use sand or salt on walking paths during winter.  Clean up any spills in your garage right away. This includes oil or grease spills. What can I do in the bathroom?  Use night lights.  Install grab bars by the toilet and in the tub and shower. Do not use towel bars as grab bars.  Use non-skid mats or decals in the tub or shower.  If you need to sit down in the shower, use a plastic, non-slip stool.  Keep the floor dry. Clean up any water that spills on the floor as soon as it happens.  Remove soap buildup in the tub or shower regularly.  Attach bath  mats securely with double-sided non-slip rug tape.  Do not have throw rugs and other things on the floor that can make you trip. What can I do in the bedroom?  Use night lights.  Make sure that you have a light by your bed that is easy to reach.  Do not use any sheets or blankets that are too big for your bed. They should not hang down onto the floor.  Have a firm chair that has side arms. You can use this for support while you get dressed.  Do not have throw rugs and other things on the floor that can make you trip. What can I do in the kitchen?  Clean up any spills right away.  Avoid walking on wet floors.  Keep items that you use a lot in easy-to-reach places.  If you need to reach something above you, use a strong step stool that has a grab bar.  Keep electrical cords out of the way.  Do not use floor polish or wax that makes floors slippery. If you must use wax, use non-skid floor wax.  Do not have throw rugs and other things on the floor that can make you trip. What can I do  with my stairs?  Do not leave any items on the stairs.  Make sure that there are handrails on both sides of the stairs and use them. Fix handrails that are broken or loose. Make sure that handrails are as long as the stairways.  Check any carpeting to make sure that it is firmly attached to the stairs. Fix any carpet that is loose or worn.  Avoid having throw rugs at the top or bottom of the stairs. If you do have throw rugs, attach them to the floor with carpet tape.  Make sure that you have a light switch at the top of the stairs and the bottom of the stairs. If you do not have them, ask someone to add them for you. What else can I do to help prevent falls?  Wear shoes that:  Do not have high heels.  Have rubber bottoms.  Are comfortable and fit you well.  Are closed at the toe. Do not wear sandals.  If you use a stepladder:  Make sure that it is fully opened. Do not climb a closed  stepladder.  Make sure that both sides of the stepladder are locked into place.  Ask someone to hold it for you, if possible.  Clearly mark and make sure that you can see:  Any grab bars or handrails.  First and last steps.  Where the edge of each step is.  Use tools that help you move around (mobility aids) if they are needed. These include:  Canes.  Walkers.  Scooters.  Crutches.  Turn on the lights when you go into a dark area. Replace any light bulbs as soon as they burn out.  Set up your furniture so you have a clear path. Avoid moving your furniture around.  If any of your floors are uneven, fix them.  If there are any pets around you, be aware of where they are.  Review your medicines with your doctor. Some medicines can make you feel dizzy. This can increase your chance of falling. Ask your doctor what other things that you can do to help prevent falls. This information is not intended to replace advice given to you by your health care provider. Make sure you discuss any questions you have with your health care provider. Document Released: 11/13/2008 Document Revised: 06/25/2015 Document Reviewed: 02/21/2014 Elsevier Interactive Patient Education  2017 [employer].

## 2019-08-13 NOTE — Patient Instructions (Addendum)
Follow up in 6 months to recheck cholesterol Continue the fluoxetine daily.  I'm so glad it's helping! Call with any questions or concerns Hang in there!!!

## 2019-08-20 ENCOUNTER — Telehealth: Payer: Self-pay | Admitting: Family Medicine

## 2019-08-20 MED ORDER — FLUOXETINE HCL 10 MG PO CAPS: 10.0000 mg | ORAL_CAPSULE | Freq: Every day | ORAL | 1 refills | Status: DC

## 2019-08-20 NOTE — Telephone Encounter (Signed)
Pt called in asking for a new script of the fluoxetine to be sent to [facility]

## 2019-08-20 NOTE — Telephone Encounter (Signed)
Medication filled to pharmacy as requested.   

## 2019-10-13 ENCOUNTER — Other Ambulatory Visit: Payer: Self-pay | Admitting: Family Medicine

## 2019-10-15 ENCOUNTER — Ambulatory Visit: Payer: Medicare Other | Attending: Internal Medicine

## 2019-10-15 DIAGNOSIS — Z23 Encounter for immunization: Secondary | ICD-10-CM

## 2019-10-15 MED FILL — FLUAD QUADRIVALENT 0.5 ML P: 0.5 | 1 days supply | Qty: 1 | Fill #0

## 2019-10-15 NOTE — Progress Notes (Signed)
   C[facility] Vaccination [facility]  Name:  Tonya Johns    MRN: [id number] DOB: [birth year]  10/15/2019  Tonya Johns was observed post Covid-19 immunization for 15 minutes without incident. She was provided with Vaccine Information Sheet and instruction to access the V-Safe system. Vaccinated By: Benjaman Pott.  Tonya Johns was instructed to call 911 with any severe reactions post vaccine: Marland Kitchen Difficulty breathing  . Swelling of face and throat  . A fast heartbeat  . A bad rash all over body  . Dizziness and weakness

## 2019-10-21 ENCOUNTER — Telehealth: Payer: Self-pay | Admitting: Family Medicine

## 2019-10-21 NOTE — Chronic Care Management (AMB) (Signed)
  Chronic Care Management   Note  10/21/2019 Name: Tonya Johns MRN: [id number] DOB: 1930-12-04  Tonya Johns is a 86 y.o. year old female who is a primary care patient of Beverely Low, Helane Rima, MD. I reached out to [name] by phone today in response to a referral sent by Ms. Chardai Choung's PCP, Sheliah Hatch, MD.   Ms. Riera was given information about Chronic Care Management services today including:  1. CCM service includes personalized support from designated clinical staff supervised by her physician, including individualized plan of care and coordination with other care providers 2. 24/7 contact phone numbers for assistance for urgent and routine care needs. 3. Service will only be billed when office clinical staff spend 20 minutes or more in a month to coordinate care. 4. Only one practitioner may furnish and bill the service in a calendar month. 5. The patient may stop CCM services at any time (effective at the end of the month) by phone call to the office staff.   Patient agreed to services and verbal consent obtained.   Follow up plan:   Carmell Austria Upstream Scheduler

## 2019-10-24 ENCOUNTER — Ambulatory Visit: Payer: Medicare Other | Admitting: Endocrinology

## 2019-11-04 ENCOUNTER — Encounter: Payer: Self-pay | Admitting: Endocrinology

## 2019-11-04 ENCOUNTER — Ambulatory Visit (INDEPENDENT_AMBULATORY_CARE_PROVIDER_SITE_OTHER): Payer: Medicare Other | Admitting: Endocrinology

## 2019-11-04 ENCOUNTER — Other Ambulatory Visit: Payer: Self-pay

## 2019-11-04 VITALS — BP 128/76 | HR 77 | Ht 64.0 in | Wt 194.4 lb

## 2019-11-04 DIAGNOSIS — E042 Nontoxic multinodular goiter: Secondary | ICD-10-CM

## 2019-11-04 NOTE — Progress Notes (Signed)
Subjective:    Patient ID: Tonya Johns, female    DOB: [birth year], 86 y.o.   MRN: [id number]  HPI Pt returns for f/u of post-RAI hypothyroidism (in 2012, she had RAI, for hyperthyroidism due to multinodular goiter; she started on synthroid in 2013; [place] in 2016 was only minimally changed; she declines any more [place]).  She does not notice the goiter.  pt states she feels well in general.  She takes synthroid as rx'ed.    Past Medical History:  Diagnosis Date  . Anemia   . Arthritis   . Diverticulitis   . GERD (gastroesophageal reflux disease)   . Hyperlipidemia   . Hypothyroidism   . Leg swelling   . Osteoporosis     Past Surgical History:  Procedure Laterality Date  . EYE SURGERY Bilateral    cataract removal  . NO PAST SURGERIES    . TOTAL KNEE ARTHROPLASTY Left 01/09/2017   Procedure: TOTAL KNEE ARTHROPLASTY;  Surgeon: Gean Birchwood, MD;  Location: [facility] OR;  Service: Orthopedics;  Laterality: Left;    Social History   Socioeconomic History  . Marital status: Single    Spouse name: Not on file  . Number of children: 3  . Years of education: Not on file  . Highest education level: Not on file  Occupational History  . Occupation: retired  Tobacco Use  . Smoking status: Never Smoker  . Smokeless tobacco: Never Used  Vaping Use  . Vaping Use: Never used  Substance and Sexual Activity  . Alcohol use: No  . Drug use: No  . Sexual activity: Not on file  Other Topics Concern  . Not on file  Social History Narrative  . Not on file   Social Determinants of Health   Financial Resource Strain: Low Risk   . Difficulty of Paying Living Expenses: Not hard at all  Food Insecurity: No Food Insecurity  . Worried About Programme researcher, broadcasting/film/video in the Last Year: Never true  . Ran Out of Food in the Last Year: Never true  Transportation Needs: No Transportation Needs  . Lack of Transportation (Medical): No  . Lack of Transportation (Non-Medical): No  Physical Activity: Inactive  . Days  of Exercise per Week: 0 days  . Minutes of Exercise per Session: 0 min  Stress: No Stress Concern Present  . Feeling of Stress : Not at all  Social Connections: Moderately Integrated  . Frequency of Communication with Friends and Family: More than three times a week  . Frequency of Social Gatherings with Friends and Family: More than three times a week  . Attends Religious Services: More than 4 times per year  . Active Member of Clubs or Organizations: Yes  . Attends Banker Meetings: More than 4 times per year  . Marital Status: Never married  Intimate Partner Violence: Not At Risk  . Fear of Current or Ex-Partner: No  . Emotionally Abused: No  . Physically Abused: No  . Sexually Abused: No    Current Outpatient Medications on File Prior to Visit  Medication Sig Dispense Refill  . Coenzyme Q10 (CO Q10) 100 MG CAPS Take 100 mg by mouth at bedtime.     . famotidine (PEPCID) 20 MG tablet TAKE 1 TABLET BY MOUTH  DAILY 90 tablet 3  . FLUoxetine (PROZAC) 10 MG capsule Take 1 capsule (10 mg total) by mouth daily. 90 capsule 1  . levothyroxine (SYNTHROID) 100 MCG tablet TAKE 1 TABLET BY MOUTH  DAILY  90 tablet 3  . Liniments (BLUE-EMU SUPER STRENGTH EX) Apply 1 application topically 2 (two) times daily. For knee pain    . Liniments (SALONPAS PAIN RELIEF PATCH EX) Apply 1 patch topically at bedtime.    . Multiple Vitamin (MULTIVITAMIN WITH MINERALS) TABS tablet Take 1 tablet by mouth at bedtime.    . pravastatin (PRAVACHOL) 80 MG tablet TAKE 1 TABLET BY MOUTH  DAILY 90 tablet 3   No current facility-administered medications on file prior to visit.    Allergies  Allergen Reactions  . Penicillins Rash    Did it involve swelling of the face/tongue/throat, SOB, or low BP? No Did it involve sudden or severe rash/hives, skin peeling, or any reaction on the inside of your mouth or nose? Yes Did you need to seek medical attention at a hospital or doctor's office? No When did it last  happen?approx 2005 If all above answers are "NO", may proceed with cephalosporin use.     Family History  Problem Relation Age of Onset  . Alcohol abuse Brother   . Alcohol abuse Sister   . Arthritis Mother   . Stroke Mother   . Hypertension Mother   . Arthritis Father   . Heart disease Father   . Hyperlipidemia Brother   . Hyperlipidemia Sister   . Sudden death Brother   . Mental illness Sister   . Diabetes Sister   . Cancer Daughter        breast    BP 128/76   Pulse 77   Ht 5\' 4"  (1.626 m)   Wt 194 lb 6.4 oz (88.2 kg)   SpO2 96%   BMI 33.37 kg/m    Review of Systems     Objective:   Physical Exam VITAL SIGNS:  See vs page GENERAL: no distress NECK: 2 cm right thyroid nodule is palpable.   Lab Results  Component Value Date   TSH 1.10 07/15/2019       Assessment & Plan:  MNG, clinically stable.  she declines f/u 07/17/2019 hypothyroidism: well-replaced.  please continue the same synthroid.

## 2019-11-04 NOTE — Patient Instructions (Addendum)
Please continue the same levothyroxine.   Please come back for a follow-up appointment in 1 year.   most of the time, a "lumpy thyroid" will eventually become overactive again.  When this happens, we would first notice that you would need less thyroid medication.

## 2019-11-28 ENCOUNTER — Ambulatory Visit: Payer: Medicare Other

## 2019-11-28 DIAGNOSIS — E785 Hyperlipidemia, unspecified: Secondary | ICD-10-CM

## 2019-11-28 DIAGNOSIS — K219 Gastro-esophageal reflux disease without esophagitis: Secondary | ICD-10-CM

## 2019-11-28 NOTE — Progress Notes (Signed)
Chronic Care Management Pharmacy  Name: Kami Kube MRN: [id number]   DOB: May 21, 1930  Chief Complaint/ HPI Tonya Johns, 86 y.o., female, presents for their initial CCM visit with the clinical pharmacist via telephone due to COVID-19 pandemic .  PCP : Midge Minium, MD Encounter Diagnoses  Name Primary?   Hyperlipidemia, unspecified hyperlipidemia type Yes   Gastroesophageal reflux disease, unspecified whether esophagitis present     Office Visits:  08/13/2019 (PCP): Seen for depression, declined wanting to adjust medications.  Patient Active Problem List   Diagnosis Date Noted   Obesity (BMI 30-39.9) 01/07/2019   Abnormal EKG 03/23/2018   Dizziness 03/23/2018   Gait instability 03/23/2018   Bradycardia 03/23/2018   Primary osteoarthritis of left knee 01/09/2017   Degenerative arthritis of left knee 01/05/2017   Elevated blood pressure reading in office without diagnosis of hypertension 03/09/2015   Lumbar back pain 05/22/2014   Uterine prolapse 02/19/2014   Hypothyroidism following radioiodine therapy 07/26/2011   Elevated glucose 04/07/2011   Multinodular goiter 12/10/2010   Knee pain, bilateral 11/25/2010   Depression 11/25/2010   Heart murmur 11/25/2010   Physical exam 07/14/2010   GERD (gastroesophageal reflux disease) 06/15/2010   Hyperlipidemia 06/15/2010   Past Surgical History:  Procedure Laterality Date   EYE SURGERY Bilateral    cataract removal   NO PAST SURGERIES     TOTAL KNEE ARTHROPLASTY Left 01/09/2017   Procedure: TOTAL KNEE ARTHROPLASTY;  Surgeon: Frederik Pear, MD;  Location: [facility];  Service: Orthopedics;  Laterality: Left;   Family History  Problem Relation Age of Onset   Alcohol abuse Brother    Alcohol abuse Sister    Arthritis Mother    Stroke Mother    Hypertension Mother    Arthritis Father    Heart disease Father    Hyperlipidemia Brother    Hyperlipidemia Sister    Sudden death Brother     Mental illness Sister    Diabetes Sister    Cancer Daughter        breast   Allergies  Allergen Reactions   Penicillins Rash    Did it involve swelling of the face/tongue/throat, SOB, or low BP? No Did it involve sudden or severe rash/hives, skin peeling, or any reaction on the inside of your mouth or nose? Yes Did you need to seek medical attention at a hospital or doctor's office? No When did it last happen?approx 2005 If all above answers are NO, may proceed with cephalosporin use.    Outpatient Encounter Medications as of 11/28/2019  Medication Sig   Coenzyme Q10 (CO Q10) 100 MG CAPS Take 100 mg by mouth at bedtime.    famotidine (PEPCID) 20 MG tablet TAKE 1 TABLET BY MOUTH  DAILY   FLUoxetine (PROZAC) 10 MG capsule Take 1 capsule (10 mg total) by mouth daily.   levothyroxine (SYNTHROID) 100 MCG tablet TAKE 1 TABLET BY MOUTH  DAILY   Liniments (BLUE-EMU SUPER STRENGTH EX) Apply 1 application topically 2 (two) times daily. For knee pain   Liniments (SALONPAS PAIN RELIEF PATCH EX) Apply 1 patch topically at bedtime.   Multiple Vitamin (MULTIVITAMIN WITH MINERALS) TABS tablet Take 1 tablet by mouth at bedtime.   pravastatin (PRAVACHOL) 80 MG tablet TAKE 1 TABLET BY MOUTH  DAILY   No facility-administered encounter medications on file as of 11/28/2019.   Patient Care Team    Relationship Specialty Notifications Start End  Midge Minium, MD PCP - General Family Medicine  08/11/10  Comment: Judene Companion, MD Consulting Physician Endocrinology  09/05/14   Frederik Pear, MD Consulting Physician Orthopedic Surgery  12/09/16   Madelin Rear, [facility] Pharmacist Pharmacist  10/21/19    Comment: [phone]   Current Diagnosis/Assessment: Goals Addressed            This Visit's Progress    PharmD Care Plan       CARE PLAN ENTRY (see longitudinal plan of care for additional care plan information)  Current Barriers:   Chronic Disease Management  support, education, and care coordination needs related to Hyperlipidemia and GERD   Hyperlipidemia Lab Results  Component Value Date/Time   LDLCALC 109 (H) 07/15/2019 02:12 PM   LDLCALC 115 (H) 01/07/2019 11:31 AM   LDLDIRECT 124.0 09/14/2015 01:55 PM    Pharmacist Clinical Goal(s): o Over the next 180 days, patient will work with PharmD and providers to maintain LDL goal < 130  Current regimen:  o Pravastatin 80 mg once daily  Interventions: o We discussed diet and exercise extensively including focus on fruits/vegetables, white meat such as skinless chicken fish ground [place] in place of red meats. Discussed replacing carb rich snacks with nuts.  Patient self care activities - Over the next 180 days, patient will: o Continue current management  GERD  Pharmacist Clinical Goal(s) o Over the next 180 days, patient will work with PharmD and providers to minimize acid reflux symptoms and ensure medication safety  Current regimen:  o Famotidine 20 mg once daily  Interventions: o We discussed: Avoidance of potential triggers such as alcohol, fatty foods, lying down after eating, and tomato sauce.  Patient self care activities - Over the next 180 days, patient will: o Attempt avoidance of above potential acid reflux triggers - see handout on GERD for additional information.  Medication management  Pharmacist Clinical Goal(s): o Over the next 180 days, patient will work with PharmD and providers to maintain optimal medication adherence  Current pharmacy: [facility]  Interventions o Comprehensive medication review performed. o Continue current medication management strategy  Patient self care activities - Over the next 180 days, patient will: o Take medications as prescribed o Report any questions or concerns to PharmD and/or provider(s) Initial goal documentation.      Hyperglycemia   A1c goal < 6.5%  Lab Results  Component Value Date/Time   HGBA1C 6.3  11/25/2010 01:43 PM    Checking BG: n/a. Recent FBG readings: n/a.  Previous medications: n/a. Patient is currently at goal on the following medications:   N/a. Diet and exercise alone  We discussed diet and exercise extensively including focus on fruits/vegetables, white meat such as skinless chicken fish ground [place] in place of red meats. Discussed replacing carb rich snacks with nuts.    Plan  Continue current medications.  Hyperlipidemia   LDL goal < 100-130      Component Value Date/Time   CHOL 189 07/15/2019 1412   TRIG 86.0 07/15/2019 1412   HDL 62.20 07/15/2019 1412   LDLCALC 109 (H) 07/15/2019 1412   LDLCALC 115 (H) 01/07/2019 1131   LDLDIRECT 124.0 09/14/2015 1355    Hepatic Function Latest Ref Rng & Units 07/15/2019 01/07/2019 07/12/2018  Total Protein 6.0 - 8.3 g/dL 6.4 6.3 6.2  Albumin 3.5 - 5.2 g/dL 4.1 - 4.0  AST 0 - 37 U/L 15 22 17   ALT 0 - 35 U/L 10 13 12   Alk Phosphatase 39 - 117 U/L 88 - 81  Total Bilirubin 0.2 - 1.2  mg/dL 0.4 0.4 0.4  Bilirubin, Direct 0.0 - 0.3 mg/dL 0.1 0.1 0.1    The ASCVD Risk score Mikey Bussing DC Jr., et al., 2013) failed to calculate for the following reasons:   The 2013 ASCVD risk score is only valid for ages 19 to 56   Previous medications: rosuvastatin, pitivastatin. Denies any side effects. Primary prevention. Patient is currently at goal the following medications:   Pravastatin 80 mg once daily   We discussed diet and exercise extensively. See hyperglycemia.  Plan  Continue current medications.  Hypothyroidism   Lab Results  Component Value Date/Time   TSH 1.10 07/15/2019 02:12 PM   TSH 0.58 01/07/2019 11:31 AM   FREET4 1.14 10/25/2018 01:42 PM   FREET4 1.43 03/24/2018 08:48 AM   TSH is currently within range on the following medications:   Levothyroxine 100 mcg once daily   Counseled on consistent use of levothyroxine 30 minutes before breakfast and other medications.  Plan  Continue current  medications.  GERD   Patient denies acid reflux.  Currently controlled on:  Famotidine 20 mg once daily  We discussed: Avoidance of potential triggers such as alcohol, fatty foods, lying down after eating, and tomato sauce.  Plan   Continue current medications. 6 month RPH f/u to discuss possibility of stopping famotidine if appropriate.  Depression   PHQ9 SCORE ONLY 08/13/2019 08/13/2019 07/15/2019  PHQ-9 Total Score 1 6 0   Denies side effects, reports ongoing benefit of taking medication. Patient is currently controlled on the following medications:   Fluoxetine 10 mg once daily   Plan  Continue current management. Pt to schedule 49-monthfollow-up with PCP.   Vaccines   Immunization History  Administered Date(s) Administered   Fluad Quad(high Dose 65+) 10/02/2018   Influenza Split 11/25/2010, 11/16/2011   Influenza,inj,Quad PF,6+ Mos 12/06/2012, 11/07/2013, 10/23/2014, 11/26/2015, 09/20/2016, 09/30/2017   PFIZER SARS-COV-2 Vaccination 02/10/2019, 03/03/2019, 10/15/2019   Pneumococcal Conjugate-13 02/19/2014   Pneumococcal Polysaccharide-23 03/10/2016   Tdap 07/15/2019   Zoster Recombinat (Shingrix) 04/28/2017, 09/30/2017   Reviewed and discussed patient's vaccination history.  Reports receiving flu shot same day as pfizer covid booster.  Plan  No recommendations.  Medication Management / Care Coordination   Receives prescription medications from:  [id number] [place], [place]: [phone]: [phone] [address]soGraysonSuite [address] [place]one: [phone]: [id number]one: [phone]: [phone] any issues with current medication management.  Pt encourage to  schedule 6 month f/u with PCP, declined wanting to schedule today.  Plan  Continue current medication management strategy. ___________________________ SDOH (Social Determinants of Health) assessments performed: Yes.  Future Appointments  Date Time Provider DepRainsville/29/2022  9:00 AM [facility] CCM PHARMACIST [facility]  11/04/2020  1:30 PM EllRenato ShinD [facility] None   Visit follow-up:   CPA follow-up: 3 month gen/adh.  RPH follow-up: 6 month telephone visit. Discuss acid reflux, consider discontinuing famotidine if reflux as appropriate.  JacMadelin Rearharm.D., BCGP Clinical Pharmacist [facility] ([phone]

## 2019-11-28 NOTE — Patient Instructions (Addendum)
Tonya Johns,  It was great talking with you today, thank you for taking the time to review your medications with me. I will call you in 6 months to talk about acid reflux medications and avoidance of foods that may trigger acid reflux. Please review care plan below and call me at [phone] (work cell phone) with any questions!  Also, as a reminder - you will need to schedule your 6 month f/u with Dr. Beverely Low this coming January [phone] (office front desk).  Thank you, Tonya Johns, Pharm.D., BCGP Clinical Pharmacist [facility] [phone]  Goals Addressed            This Visit's Progress   . PharmD Care Plan       CARE PLAN ENTRY (see longitudinal plan of care for additional care plan information)  Current Barriers:  . Chronic Disease Management support, education, and care coordination needs related to Hyperlipidemia and GERD   Hyperlipidemia Lab Results  Component Value Date/Time   LDLCALC 109 (H) 07/15/2019 02:12 PM   LDLCALC 115 (H) 01/07/2019 11:31 AM   LDLDIRECT 124.0 09/14/2015 01:55 PM   . Pharmacist Clinical Goal(s): o Over the next 180 days, patient will work with PharmD and providers to maintain LDL goal < 130 . Current regimen:  o Pravastatin 80 mg once daily . Interventions: o We discussed diet and exercise extensively including focus on fruits/vegetables, white meat such as skinless chicken fish ground [place] in place of red meats. Discussed replacing carb rich snacks with nuts. . Patient self care activities - Over the next 180 days, patient will: o Continue current management  GERD . Pharmacist Clinical Goal(s) o Over the next 180 days, patient will work with PharmD and providers to minimize acid reflux symptoms and ensure medication safety . Current regimen:  o Famotidine 20 mg once daily . Interventions: o We discussed: Avoidance of potential triggers such as alcohol, fatty foods, lying down after  eating, and tomato sauce. . Patient self care activities - Over the next 180 days, patient will: o Attempt avoidance of above potential acid reflux triggers - see handout on GERD for additional information.  Medication management . Pharmacist Clinical Goal(s): o Over the next 180 days, patient will work with PharmD and providers to maintain optimal medication adherence . Current pharmacy: [facility] . Interventions o Comprehensive medication review performed. o Continue current medication management strategy . Patient self care activities - Over the next 180 days, patient will: o Take medications as prescribed o Report any questions or concerns to PharmD and/or provider(s) Initial goal documentation.      The patient verbalized understanding of instructions provided today and agreed to receive a mailed copy of patient instruction and/or educational materials. Telephone follow up appointment with pharmacy team member scheduled for: See next appointment with "Care Management Staff" under "What's Next" below.  Gastroesophageal Reflux Disease, Adult Gastroesophageal reflux (GER) happens when acid from the stomach flows up into the tube that connects the mouth and the stomach (esophagus). Normally, food travels down the esophagus and stays in the stomach to be digested. With GER, food and stomach acid sometimes move back up into the esophagus. You may have a disease called gastroesophageal reflux disease (GERD) if the reflux:  Happens often.  Causes frequent or very bad symptoms.  Causes problems such as damage to the esophagus. When this happens, the esophagus becomes sore and swollen (inflamed). Over time, GERD can make small holes (ulcers) in  the lining of the esophagus. What are the causes? This condition is caused by a problem with the muscle between the esophagus and the stomach. When this muscle is weak or not normal, it does not close properly to keep food and acid from  coming back up from the stomach. The muscle can be weak because of:  Tobacco use.  Pregnancy.  Having a certain type of hernia (hiatal hernia).  Alcohol use.  Certain foods and drinks, such as coffee, chocolate, onions, and peppermint. What increases the risk? You are more likely to develop this condition if you:  Are overweight.  Have a disease that affects your connective tissue.  Use NSAID medicines. What are the signs or symptoms? Symptoms of this condition include:  Heartburn.  Difficult or painful swallowing.  The feeling of having a lump in the throat.  A bitter taste in the mouth.  Bad breath.  Having a lot of saliva.  Having an upset or bloated stomach.  Belching.  Chest pain. Different conditions can cause chest pain. Make sure you see your doctor if you have chest pain.  Shortness of breath or noisy breathing (wheezing).  Ongoing (chronic) cough or a cough at night.  Wearing away of the surface of teeth (tooth enamel).  Weight loss. How is this treated? Treatment will depend on how bad your symptoms are. Your doctor may suggest:  Changes to your diet.  Medicine.  Surgery. Follow these instructions at home: Eating and drinking   Follow a diet as told by your doctor. You may need to avoid foods and drinks such as: ? Coffee and tea (with or without caffeine). ? Drinks that contain alcohol. ? Energy drinks and sports drinks. ? Bubbly (carbonated) drinks or sodas. ? Chocolate and cocoa. ? Peppermint and mint flavorings. ? Garlic and onions. ? Horseradish. ? Spicy and acidic foods. These include peppers, chili powder, curry powder, vinegar, hot sauces, and BBQ sauce. ? Citrus fruit juices and citrus fruits, such as oranges, lemons, and limes. ? Tomato-based foods. These include red sauce, chili, salsa, and pizza with red sauce. ? Fried and fatty foods. These include donuts, french fries, potato chips, and high-fat dressings. ? High-fat  meats. These include hot dogs, rib eye steak, sausage, ham, and bacon. ? High-fat dairy items, such as whole milk, butter, and cream cheese.  Eat small meals often. Avoid eating large meals.  Avoid drinking large amounts of liquid with your meals.  Avoid eating meals during the 2-3 hours before bedtime.  Avoid lying down right after you eat.  Do not exercise right after you eat. Lifestyle   Do not use any products that contain nicotine or tobacco. These include cigarettes, e-cigarettes, and chewing tobacco. If you need help quitting, ask your doctor.  Try to lower your stress. If you need help doing this, ask your doctor.  If you are overweight, lose an amount of weight that is healthy for you. Ask your doctor about a safe weight loss goal. General instructions  Pay attention to any changes in your symptoms.  Take over-the-counter and prescription medicines only as told by your doctor. Do not take aspirin, ibuprofen, or other NSAIDs unless your doctor says it is okay.  Wear loose clothes. Do not wear anything tight around your waist.  Raise (elevate) the head of your bed about 6 inches (15 cm).  Avoid bending over if this makes your symptoms worse.  Keep all follow-up visits as told by your doctor. This is important. Contact  a doctor if:  You have new symptoms.  You lose weight and you do not know why.  You have trouble swallowing or it hurts to swallow.  You have wheezing or a cough that keeps happening.  Your symptoms do not get better with treatment.  You have a hoarse voice. Get help right away if:  You have pain in your arms, neck, jaw, teeth, or back.  You feel sweaty, dizzy, or light-headed.  You have chest pain or shortness of breath.  You throw up (vomit) and your throw-up looks like blood or coffee grounds.  You pass out (faint).  Your poop (stool) is bloody or black.  You cannot swallow, drink, or eat. Summary  If a person has gastroesophageal  reflux disease (GERD), food and stomach acid move back up into the esophagus and cause symptoms or problems such as damage to the esophagus.  Treatment will depend on how bad your symptoms are.  Follow a diet as told by your doctor.  Take all medicines only as told by your doctor. This information is not intended to replace advice given to you by your health care provider. Make sure you discuss any questions you have with your health care provider. Document Revised: 07/26/2017 Document Reviewed: 07/26/2017 Elsevier Patient Education  2020 [employer].

## 2020-01-11 IMAGING — CT CT HEAD W/O CM
3 series · 16 of 47 positions shown, 19 images · non-contrast
Comparison: None.

CLINICAL DATA: Headache and dizziness for 2 days.

EXAM:
CT HEAD WITHOUT CONTRAST
TECHNIQUE: Contiguous axial images were obtained from the base of the skull
through the vertex without intravenous contrast.

[Series 2: head wo · axial · 0.43mm/px · z∈[-203,-73]mm · 10 of 32 slices shown, 13 images]
[im 3/32  brain]
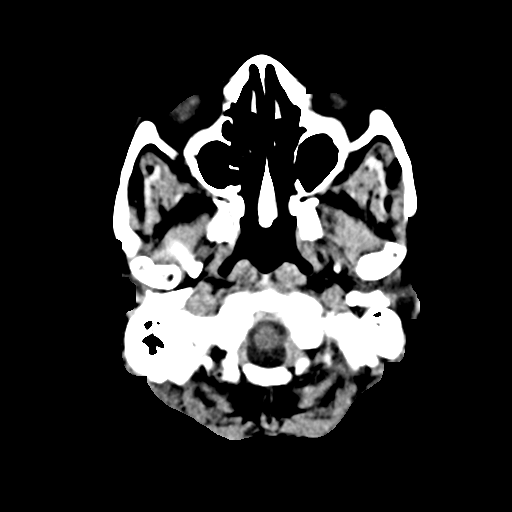
[im 3/32  bone]
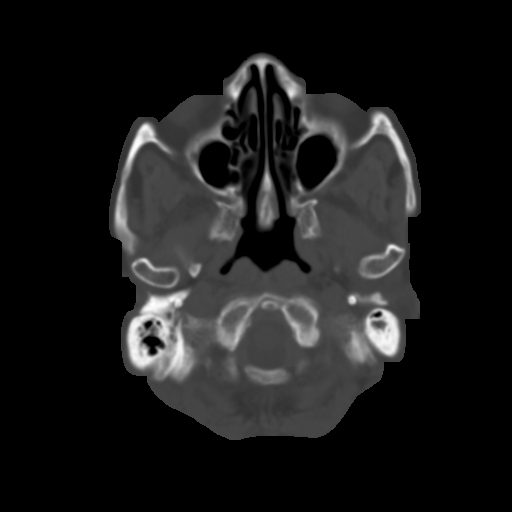
[im 6/32  brain]
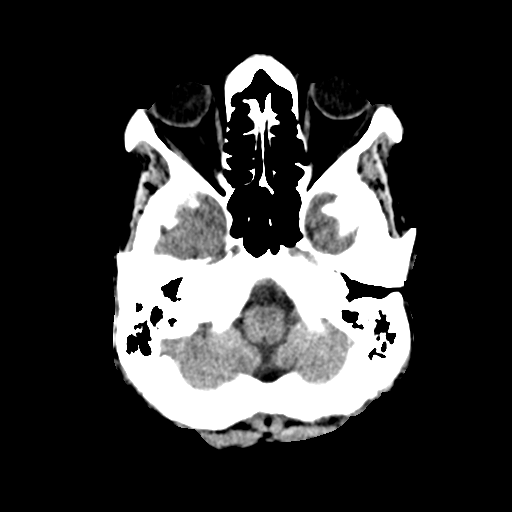
[im 9/32  brain]
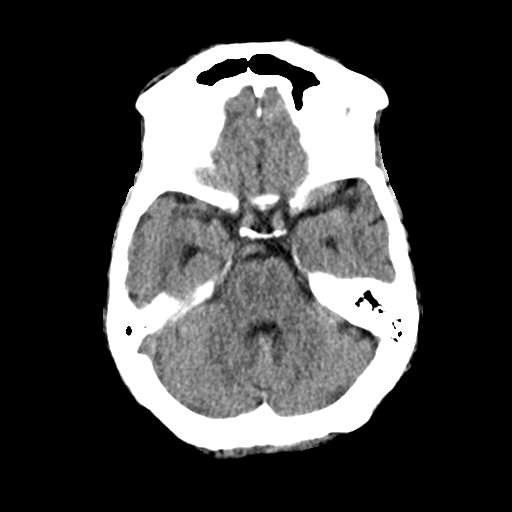
[im 11/32  brain]
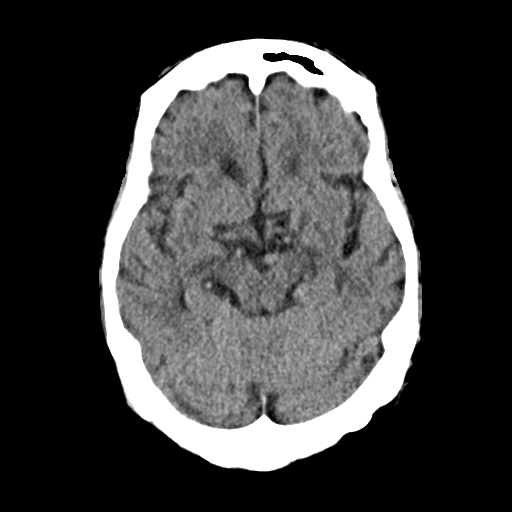
[im 14/32  brain]
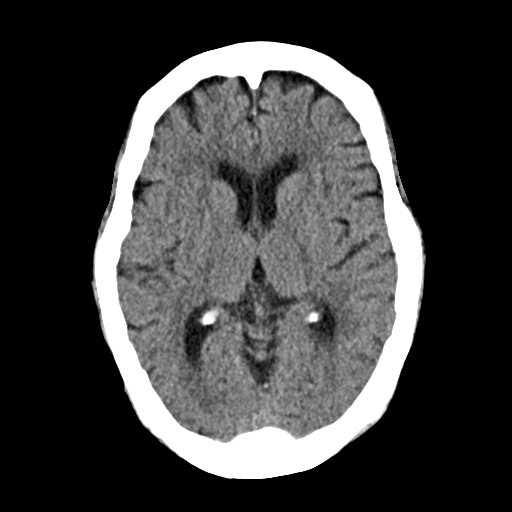
[im 14/32  bone]
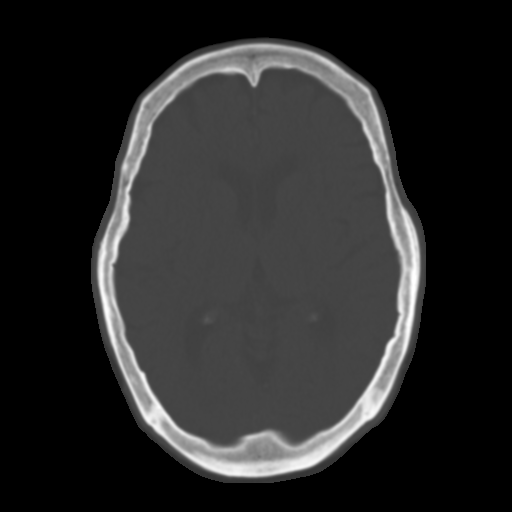
[im 18/32  brain]
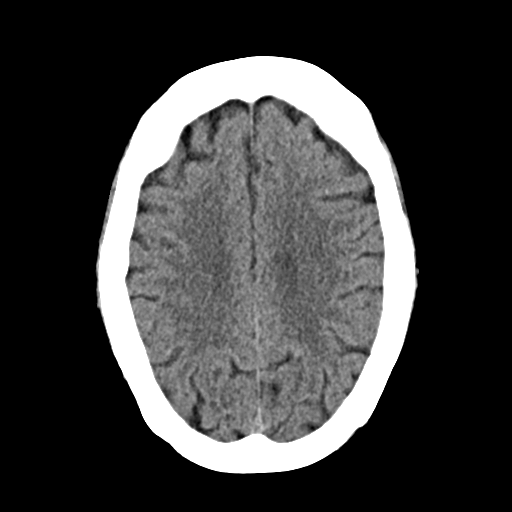
[im 21/32  brain]
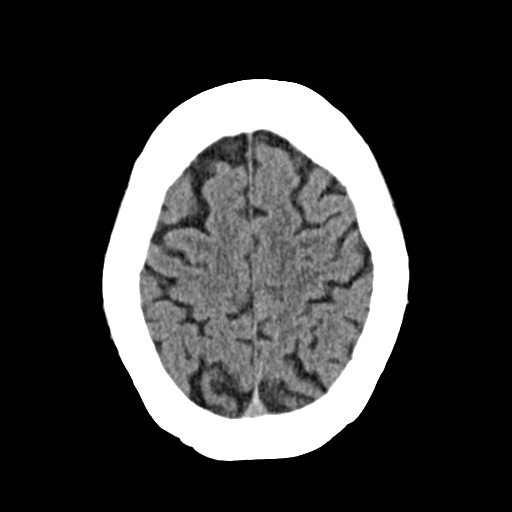
[im 24/32  brain]
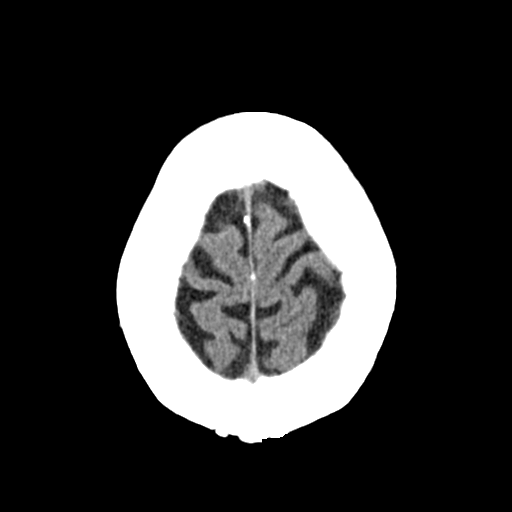
[im 26/32  brain]
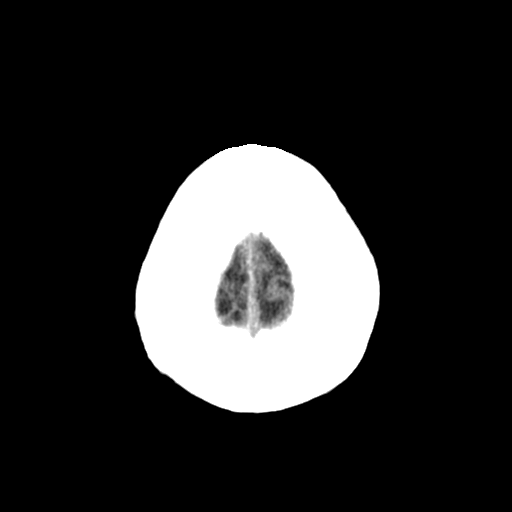
[im 26/32  bone]
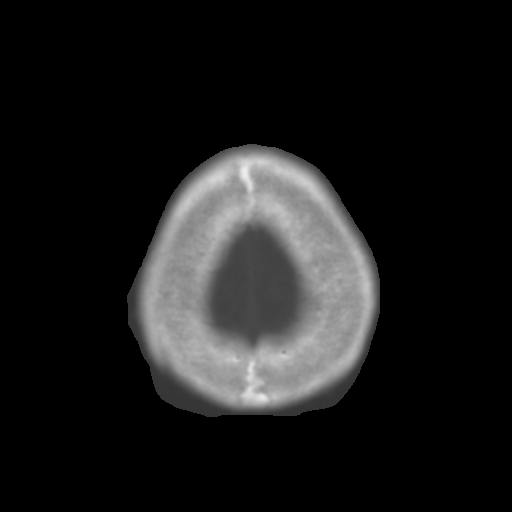
[im 29/32  brain]
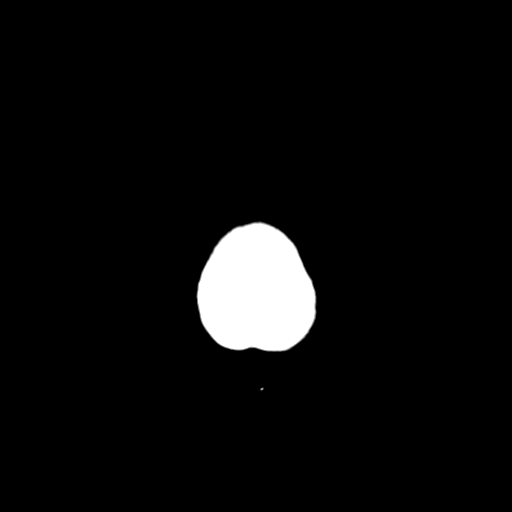

[Series 4: coronal soft · coronal · 0.30mm/px · 3 of 65 slices shown]
[im 22/65  brain]
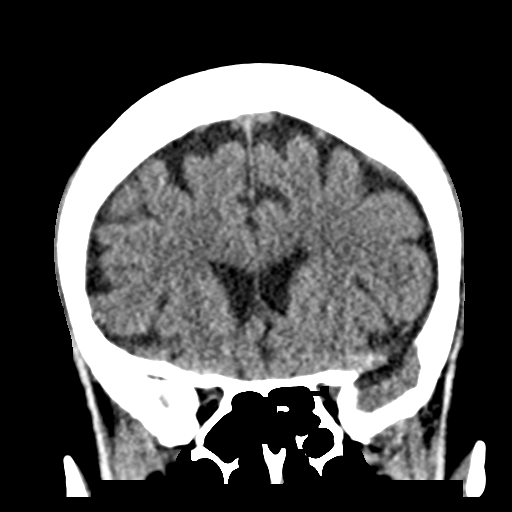
[im 29/65  brain]
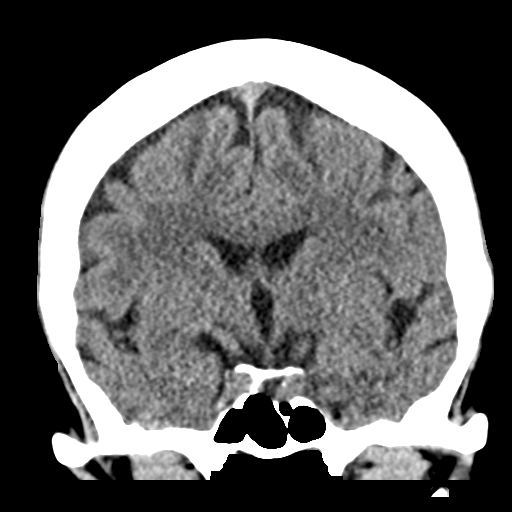
[im 36/65  brain]
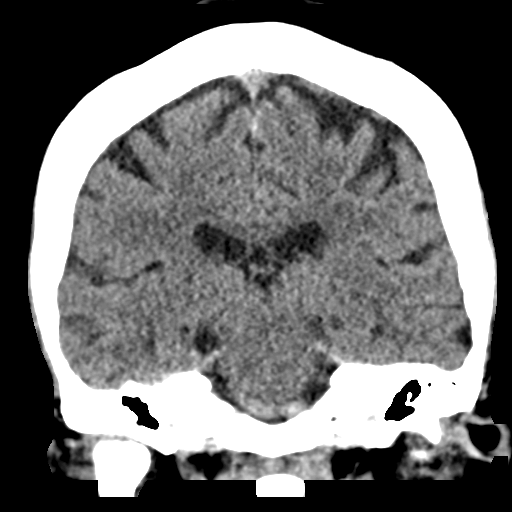

[Series 5: sag soft · sagittal · 0.30mm/px · 3 of 53 slices shown]
[im 18/53  brain]
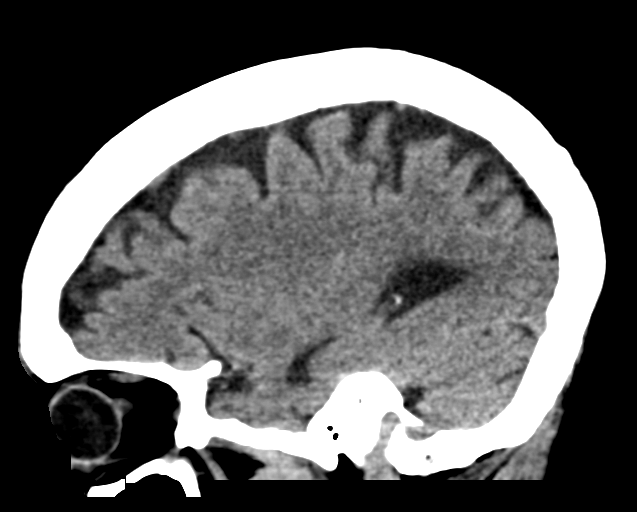
[im 27/53  brain]
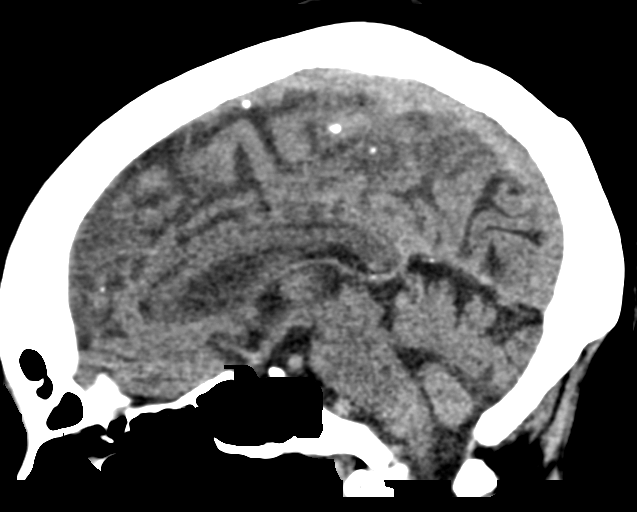
[im 35/53  brain]
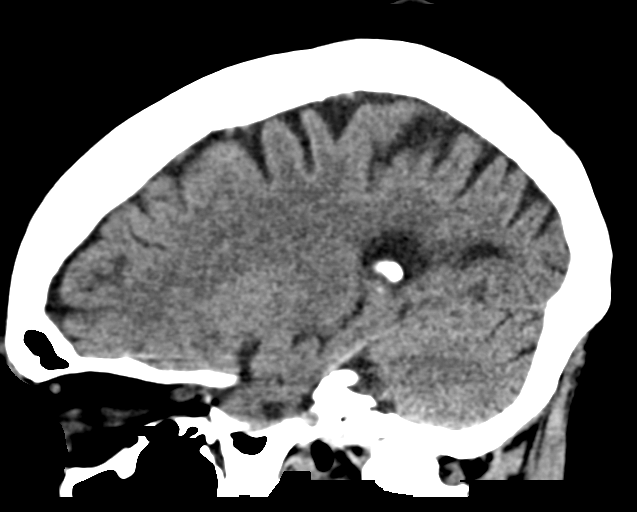

[16 of 47 positions shown; findings below may reference images not displayed]

FINDINGS: Brain: No evidence of acute infarction, hemorrhage, hydrocephalus,
extra-axial collection or mass lesion/mass effect. There is chronic
diffuse atrophy. Chronic bilateral periventricular white matter
small vessel ischemic changes noted.

Vascular: No hyperdense vessel is noted.

Skull: Normal. Negative for fracture or focal lesion.

Sinuses/Orbits: No acute finding.

Other: None.
IMPRESSION: No focal acute intracranial abnormality identified.

Chronic diffuse atrophy.

## 2020-01-27 ENCOUNTER — Other Ambulatory Visit: Payer: Self-pay | Admitting: Family Medicine

## 2020-02-14 ENCOUNTER — Other Ambulatory Visit: Payer: Self-pay | Admitting: Family Medicine

## 2020-02-18 ENCOUNTER — Encounter: Payer: Self-pay | Admitting: Family Medicine

## 2020-02-18 ENCOUNTER — Telehealth (INDEPENDENT_AMBULATORY_CARE_PROVIDER_SITE_OTHER): Payer: Medicare Other | Admitting: Family Medicine

## 2020-02-18 DIAGNOSIS — E785 Hyperlipidemia, unspecified: Secondary | ICD-10-CM

## 2020-02-18 DIAGNOSIS — E890 Postprocedural hypothyroidism: Secondary | ICD-10-CM

## 2020-02-18 DIAGNOSIS — F32A Depression, unspecified: Secondary | ICD-10-CM | POA: Diagnosis not present

## 2020-02-18 NOTE — Progress Notes (Signed)
Virtual Visit via Video   I connected with patient on 02/18/20 at  1:00 PM EST by a video enabled telemedicine application and verified that I am speaking with the correct person using two identifiers.  Location patient: Home Location provider: [facility] [facility] Persons participating in the virtual visit: Patient, Provider, CMA (Sabrina M)  I discussed the limitations of evaluation and management by telemedicine and the availability of in person appointments. The patient expressed understanding and agreed to proceed.  Subjective:   HPI:   Hyperlipidemia- chronic problem, on Pravastatin 80mg  daily  No CP, abd pain, N/V, SOB  Hypothyroid- chronic problem, on Levothyroxine daily.  Some decreased energy level.  Denies changes to skin/hair/nails.  Depression- ongoing issue.  On Fluoxetine 10mg  daily.  Pt reports mood is doing good, 'I haven't had any problems w/ that'.  Some difficulties w/ sleep but this improves w/ melatonin.  ROS:   See pertinent positives and negatives per HPI.  Patient Active Problem List   Diagnosis Date Noted  . Obesity (BMI 30-39.9) 01/07/2019  . Abnormal EKG 03/23/2018  . Dizziness 03/23/2018  . Gait instability 03/23/2018  . Bradycardia 03/23/2018  . Primary osteoarthritis of left knee 01/09/2017  . Degenerative arthritis of left knee 01/05/2017  . Elevated blood pressure reading in office without diagnosis of hypertension 03/09/2015  . Lumbar back pain 05/22/2014  . Uterine prolapse 02/19/2014  . Hypothyroidism following radioiodine therapy 07/26/2011  . Elevated glucose 04/07/2011  . Multinodular goiter 12/10/2010  . Knee pain, bilateral 11/25/2010  . Depression 11/25/2010  . Heart murmur 11/25/2010  . Physical exam 07/14/2010  . GERD (gastroesophageal reflux disease) 06/15/2010  . Hyperlipidemia 06/15/2010    Social History   Tobacco Use  . Smoking status: Never Smoker  . Smokeless tobacco: Never Used  Substance Use  Topics  . Alcohol use: No    Current Outpatient Medications:  .  Coenzyme Q10 (CO Q10) 100 MG CAPS, Take 100 mg by mouth at bedtime., Disp: , Rfl:  .  famotidine (PEPCID) 20 MG tablet, TAKE 1 TABLET BY MOUTH  DAILY, Disp: 90 tablet, Rfl: 3 .  FLUoxetine (PROZAC) 10 MG capsule, TAKE 1 CAPSULE BY MOUTH  DAILY, Disp: 90 capsule, Rfl: 3 .  levothyroxine (SYNTHROID) 100 MCG tablet, TAKE 1 TABLET BY MOUTH  DAILY, Disp: 90 tablet, Rfl: 3 .  Liniments (BLUE-EMU SUPER STRENGTH EX), Apply 1 application topically 2 (two) times daily. For knee pain, Disp: , Rfl:  .  Liniments (SALONPAS PAIN RELIEF PATCH EX), Apply 1 patch topically at bedtime., Disp: , Rfl:  .  Multiple Vitamin (MULTIVITAMIN WITH MINERALS) TABS tablet, Take 1 tablet by mouth at bedtime., Disp: , Rfl:  .  pravastatin (PRAVACHOL) 80 MG tablet, TAKE 1 TABLET BY MOUTH  DAILY, Disp: 90 tablet, Rfl: 3  Allergies  Allergen Reactions  . Penicillins Rash    Did it involve swelling of the face/tongue/throat, SOB, or low BP? No Did it involve sudden or severe rash/hives, skin peeling, or any reaction on the inside of your mouth or nose? Yes Did you need to seek medical attention at a hospital or doctor's office? No When did it last happen?approx 2005 If all above answers are "NO", may proceed with cephalosporin use.     Objective:   There were no vitals taken for this visit. AAOx3, NAD NCAT, EOMI No obvious CN deficits Coloring WNL Pt is able to speak clearly, coherently without shortness of breath or increased work of breathing.  Thought process is linear.  Mood is appropriate.   Assessment and Plan:   Hyperlipidemia- chronic problem, on Pravastatin 80mg  daily w/o difficulty.  Check labs.  Adjust meds prn   Hypothyroid- chronic problem.  Some fatigue but pt feels this is more likely age related than thyroid.  Check labs.  Adjust meds prn   Depression- currently well controlled on Fluoxetine 10mg  daily.  No med changes at  this time   , MD 02/18/2020

## 2020-02-18 NOTE — Progress Notes (Signed)
I connected with  Tonya Johns on 02/18/20 by a video enabled telemedicine application and verified that I am speaking with the correct person using two identifiers.   I discussed the limitations of evaluation and management by telemedicine. The patient expressed understanding and agreed to proceed.

## 2020-03-08 ENCOUNTER — Encounter: Payer: Self-pay | Admitting: Family Medicine

## 2020-03-12 ENCOUNTER — Telehealth: Payer: Self-pay | Admitting: Family Medicine

## 2020-03-12 NOTE — Telephone Encounter (Signed)
Will call me back needs bw done now and a cpe after 07/14/20

## 2020-03-13 ENCOUNTER — Other Ambulatory Visit: Payer: Self-pay

## 2020-03-13 ENCOUNTER — Other Ambulatory Visit (INDEPENDENT_AMBULATORY_CARE_PROVIDER_SITE_OTHER): Payer: Medicare Other

## 2020-03-13 DIAGNOSIS — E890 Postprocedural hypothyroidism: Secondary | ICD-10-CM | POA: Diagnosis not present

## 2020-03-13 DIAGNOSIS — E785 Hyperlipidemia, unspecified: Secondary | ICD-10-CM

## 2020-03-13 LAB — TSH: TSH: 2.56 u[IU]/mL (ref 0.35–4.50)

## 2020-03-13 LAB — BASIC METABOLIC PANEL
BUN: 20 mg/dL (ref 6–23)
CO2: 25 mEq/L (ref 19–32)
Calcium: 9.4 mg/dL (ref 8.4–10.5)
Chloride: 105 mEq/L (ref 96–112)
Creatinine, Ser: 1.38 mg/dL — ABNORMAL HIGH (ref 0.40–1.20)
GFR: 34.34 mL/min — ABNORMAL LOW (ref >60.00)
Glucose, Bld: 108 mg/dL — ABNORMAL HIGH (ref 70–99)
Potassium: 4.1 mEq/L (ref 3.5–5.1)
Sodium: 139 mEq/L (ref 135–145)

## 2020-03-13 LAB — HEPATIC FUNCTION PANEL
ALT: 10 U/L (ref 0–35)
AST: 16 U/L (ref 0–37)
Albumin: 4 g/dL (ref 3.5–5.2)
Alkaline Phosphatase: 73 U/L (ref 39–117)
Bilirubin, Direct: 0.1 mg/dL (ref 0.0–0.3)
Total Bilirubin: 0.4 mg/dL (ref 0.2–1.2)
Total Protein: 6.9 g/dL (ref 6.0–8.3)

## 2020-03-13 LAB — CBC WITH DIFFERENTIAL/PLATELET
Basophils Absolute: 0 10*3/uL (ref 0.0–0.1)
Basophils Relative: 0.7 % (ref 0.0–3.0)
Eosinophils Absolute: 0.1 10*3/uL (ref 0.0–0.7)
Eosinophils Relative: 1.3 % (ref 0.0–5.0)
HCT: 35.2 % — ABNORMAL LOW (ref 36.0–46.0)
Hemoglobin: 11.8 g/dL — ABNORMAL LOW (ref 12.0–15.0)
Lymphocytes Relative: 26.6 % (ref 12.0–46.0)
Lymphs Abs: 1.9 10*3/uL (ref 0.7–4.0)
MCHC: 33.6 g/dL (ref 30.0–36.0)
MCV: 92.7 fl (ref 78.0–100.0)
Monocytes Absolute: 0.3 10*3/uL (ref 0.1–1.0)
Monocytes Relative: 4.7 % (ref 3.0–12.0)
Neutro Abs: 4.9 10*3/uL (ref 1.4–7.7)
Neutrophils Relative %: 66.7 % (ref 43.0–77.0)
Platelets: 192 10*3/uL (ref 150.0–400.0)
RBC: 3.8 Mil/uL — ABNORMAL LOW (ref 3.87–5.11)
RDW: 13.6 % (ref 11.5–15.5)
WBC: 7.3 10*3/uL (ref 4.0–10.5)

## 2020-03-13 LAB — LIPID PANEL
Cholesterol: 181 mg/dL (ref 0–200)
HDL: 58.6 mg/dL (ref >39.00)
LDL Cholesterol: 99 mg/dL (ref 0–99)
NonHDL: 122.55
Total CHOL/HDL Ratio: 3
Triglycerides: 118 mg/dL (ref 0.0–149.0)
VLDL: 23.6 mg/dL (ref 0.0–40.0)

## 2020-03-16 ENCOUNTER — Other Ambulatory Visit: Payer: Self-pay

## 2020-03-16 DIAGNOSIS — R7989 Other specified abnormal findings of blood chemistry: Secondary | ICD-10-CM

## 2020-03-16 DIAGNOSIS — R7982 Elevated C-reactive protein (CRP): Secondary | ICD-10-CM

## 2020-03-17 NOTE — Progress Notes (Signed)
Order changed to reflect elevated Cr and not CRP

## 2020-03-17 NOTE — Addendum Note (Signed)
Addended by: Sheliah Hatch on: 03/17/2020 07:25 AM   Modules accepted: Orders

## 2020-03-20 ENCOUNTER — Other Ambulatory Visit: Payer: Self-pay

## 2020-03-20 ENCOUNTER — Other Ambulatory Visit (INDEPENDENT_AMBULATORY_CARE_PROVIDER_SITE_OTHER): Payer: Medicare Other

## 2020-03-20 DIAGNOSIS — R7989 Other specified abnormal findings of blood chemistry: Secondary | ICD-10-CM | POA: Diagnosis not present

## 2020-03-20 LAB — BASIC METABOLIC PANEL
BUN: 21 mg/dL (ref 6–23)
CO2: 22 mEq/L (ref 19–32)
Calcium: 9.7 mg/dL (ref 8.4–10.5)
Chloride: 106 mEq/L (ref 96–112)
Creatinine, Ser: 1.39 mg/dL — ABNORMAL HIGH (ref 0.40–1.20)
GFR: 34.04 mL/min — ABNORMAL LOW (ref >60.00)
Glucose, Bld: 105 mg/dL — ABNORMAL HIGH (ref 70–99)
Potassium: 3.8 mEq/L (ref 3.5–5.1)
Sodium: 136 mEq/L (ref 135–145)

## 2020-05-28 ENCOUNTER — Ambulatory Visit (INDEPENDENT_AMBULATORY_CARE_PROVIDER_SITE_OTHER): Payer: Medicare Other

## 2020-05-28 ENCOUNTER — Ambulatory Visit: Payer: Medicare Other | Attending: Internal Medicine

## 2020-05-28 DIAGNOSIS — K219 Gastro-esophageal reflux disease without esophagitis: Secondary | ICD-10-CM

## 2020-05-28 DIAGNOSIS — F32A Depression, unspecified: Secondary | ICD-10-CM

## 2020-05-28 DIAGNOSIS — E785 Hyperlipidemia, unspecified: Secondary | ICD-10-CM

## 2020-05-28 DIAGNOSIS — Z20822 Contact with and (suspected) exposure to covid-19: Secondary | ICD-10-CM | POA: Diagnosis not present

## 2020-05-28 NOTE — Progress Notes (Signed)
Chronic Care Management Pharmacy Note  05/28/2020 Name:  Tonya Johns MRN:  [id number] DOB:  1930-11-06  Subjective: Tonya Johns is an 87 y.o. year old female who is a primary patient of Tabori, Aundra Millet, MD.  The CCM team was consulted for assistance with disease management and care coordination needs.    Engaged with patient by telephone for follow up visit in response to provider referral for pharmacy case management and/or care coordination services.   Consent to Services:  The patient was given information about Chronic Care Management services, agreed to services, and gave verbal consent prior to initiation of services.  Please see initial visit note for detailed documentation.   Patient Care Team: Midge Minium, MD as PCP - General (Family Medicine) Renato Shin, MD as Consulting Physician (Endocrinology) Frederik Pear, MD as Consulting Physician (Orthopedic Surgery) Madelin Rear, [facility] as Pharmacist (Pharmacist)  Recent office visits: 02/18/2020 (PCP): some decline in GFR then stabilized on repeat labs, encouraged hydrated.   Recent consult visits:   Hospital visits: None in previous 6 months  Objective: Lab Results  Component Value Date   CREATININE 1.39 (H) 03/20/2020   CREATININE 1.38 (H) 03/13/2020   CREATININE 1.27 (H) 07/15/2019   GFR 34.04 (L) 03/20/2020   GFR 34.34 (L) 03/13/2020   GFRNONAA 47 (L) 03/23/2018   GFRNONAA 42 (L) 01/10/2017  Last diabetic Eye exam: No results found for: HMDIABEYEEXA  Last diabetic Foot exam: No results found for: HMDIABFOOTEX  Lab Results  Component Value Date   CHOL 181 03/13/2020   CHOL 189 07/15/2019   TRIG 118.0 03/13/2020   TRIG 86.0 07/15/2019   HDL 58.60 03/13/2020   HDL 62.20 07/15/2019   CHOLHDL 3 03/13/2020   CHOLHDL 3 07/15/2019   VLDL 23.6 03/13/2020   VLDL 17.2 07/15/2019   LDLCALC 99 03/13/2020   LDLCALC 109 (H) 07/15/2019   LDLDIRECT 124.0 09/14/2015   LDLDIRECT 195.4 02/14/2013   Hepatic Function  Latest Ref Rng & Units 03/13/2020 07/15/2019 01/07/2019  Total Protein 6.0 - 8.3 g/dL 6.9 6.4 6.3  Albumin 3.5 - 5.2 g/dL 4.0 4.1 -  AST 0 - 37 U/L 16 15 22   ALT 0 - 35 U/L 10 10 13   Alk Phosphatase 39 - 117 U/L 73 88 -  Total Bilirubin 0.2 - 1.2 mg/dL 0.4 0.4 0.4  Bilirubin, Direct 0.0 - 0.3 mg/dL 0.1 0.1 0.1   Lab Results  Component Value Date/Time   TSH 2.56 03/13/2020 01:03 PM   TSH 1.10 07/15/2019 02:12 PM   FREET4 1.14 10/25/2018 01:42 PM   FREET4 1.43 03/24/2018 08:48 AM   CBC Latest Ref Rng & Units 03/13/2020 07/15/2019 01/07/2019  WBC 4.0 - 10.5 K/uL 7.3 7.0 6.6  Hemoglobin 12.0 - 15.0 g/dL 11.8(L) 11.6(L) 12.0  Hematocrit 36.0 - 46.0 % 35.2(L) 34.5(L) 35.9  Platelets 150.0 - 400.0 K/uL 192.0 205.0 221   Lab Results  Component Value Date/Time   VD25OH 39.83 03/09/2015 02:16 PM   VD25OH 42.61 08/16/2013 08:56 AM    Clinical ASCVD:  The ASCVD Risk score Mikey Bussing DC Jr., et al., 2013) failed to calculate for the following reasons:   The 2013 ASCVD risk score is only valid for ages 52 to 4    Social History   Tobacco Use  Smoking Status Never Smoker  Smokeless Tobacco Never Used   BP Readings from Last 3 Encounters:  11/04/19 128/76  08/13/19 130/83  08/13/19 130/83   Pulse Readings from Last 3 Encounters:  11/04/19 77  08/13/19 78  08/13/19 78   Wt Readings from Last 3 Encounters:  11/04/19 194 lb 6.4 oz (88.2 kg)  08/13/19 196 lb (88.9 kg)  08/13/19 196 lb 4 oz (89 kg)    Assessment: Review of patient past medical history, allergies, medications, health status, including review of consultants reports, laboratory and other test data, was performed as part of comprehensive evaluation and provision of chronic care management services.   SDOH:  (Social Determinants of Health) assessments and interventions performed:   CCM Care Plan Allergies  Allergen Reactions  . Penicillins Rash    Did it involve swelling of the face/tongue/throat, SOB, or low BP? No Did it  involve sudden or severe rash/hives, skin peeling, or any reaction on the inside of your mouth or nose? Yes Did you need to seek medical attention at a hospital or doctor's office? No When did it last happen?approx 2005 If all above answers are "NO", may proceed with cephalosporin use.    Medications Reviewed Today    Reviewed by Midge Minium, MD (Physician) on 02/18/20 at 1318  Med List Status: <None>  Medication Order Taking? Sig Documenting Provider Last Dose Status Informant  Coenzyme Q10 (CO Q10) 100 MG CAPS [id number] Yes Take 100 mg by mouth at bedtime. [provider] Taking Active Self  famotidine (PEPCID) 20 MG tablet [id number] Yes TAKE 1 TABLET BY MOUTH  DAILY Midge Minium, MD Taking Active   FLUoxetine (PROZAC) 10 MG capsule [id number] Yes TAKE 1 CAPSULE BY MOUTH  DAILY Midge Minium, MD Taking Active   levothyroxine (SYNTHROID) 100 MCG tablet [id number] Yes TAKE 1 TABLET BY MOUTH  DAILY Midge Minium, MD Taking Active   Liniments Caffie Pinto SUPER STRENGTH [place]) [id number] Yes Apply 1 application topically 2 (two) times daily. For knee pain [provider] Taking Active Self  Liniments [facility] PAIN RELIEF PATCH EX) [id number] Yes Apply 1 patch topically at bedtime. [provider] Taking Active Self           Med Note Marlowe Alt Mar 26, 2018  2:40 PM)    Multiple Vitamin (MULTIVITAMIN WITH MINERALS) TABS tablet [id number] Yes Take 1 tablet by mouth at bedtime. [provider] Taking Active Self  pravastatin (PRAVACHOL) 80 MG tablet [id number] Yes TAKE 1 TABLET BY MOUTH  DAILY Midge Minium, MD Taking Active          Patient Active Problem List   Diagnosis Date Noted  . Obesity (BMI 30-39.9) 01/07/2019  . Abnormal EKG 03/23/2018  . Dizziness 03/23/2018  . Gait instability 03/23/2018  . Bradycardia 03/23/2018  . Primary osteoarthritis of left knee 01/09/2017  . Degenerative arthritis of left knee  01/05/2017  . Elevated blood pressure reading in office without diagnosis of hypertension 03/09/2015  . Lumbar back pain 05/22/2014  . Uterine prolapse 02/19/2014  . Hypothyroidism following radioiodine therapy 07/26/2011  . Elevated glucose 04/07/2011  . Multinodular goiter 12/10/2010  . Knee pain, bilateral 11/25/2010  . Depression 11/25/2010  . Heart murmur 11/25/2010  . Physical exam 07/14/2010  . GERD (gastroesophageal reflux disease) 06/15/2010  . Hyperlipidemia 06/15/2010   Immunization History  Administered Date(s) Administered  . Fluad Quad(high Dose 65+) 10/02/2018  . Influenza Split 11/25/2010, 11/16/2011  . Influenza,inj,Quad PF,6+ Mos 12/06/2012, 11/07/2013, 10/23/2014, 11/26/2015, 09/20/2016, 09/30/2017  . Influenza-Unspecified 10/15/2019  . PFIZER(Purple Top)SARS-COV-2 Vaccination 02/10/2019, 03/03/2019, 10/15/2019  . Pneumococcal Conjugate-13 02/19/2014  . Pneumococcal Polysaccharide-23 03/10/2016  . Tdap 07/15/2019  . Zoster Recombinat (  Shingrix) 04/28/2017, 09/30/2017    Conditions to be addressed/monitored: MDD HLD Hypothyroidism (following RAI) GERD OA  Care Plan : [place]  Updates made by Madelin Rear, [facility] since 05/28/2020 12:00 AM    Problem: MDD HLD Hypothyroidism (following RAI) GERD OA   Priority: High    Long-Range Goal: Disease Management   Start Date: 05/28/2020  Expected End Date: 05/28/2021  This Visit's Progress: On track  Priority: High  Note:   Pharmacist Clinical Goal(s):  Marland Kitchen Over the next 365 days, patient will contact provider office for questions/concerns as evidenced notation of same in electronic health record through collaboration with PharmD and provider.   Interventions: . 1:1 collaboration with Midge Minium, MD regarding development and update of comprehensive plan of care as evidenced by provider attestation and co-signature . Inter-disciplinary care team collaboration (see longitudinal plan of  care) . Comprehensive medication review performed; medication list updated in electronic medical record  Hyperlipidemia: (LDL goal < 100) -Not ideally controlled  -labs have been stable, low CV risk -Current treatment: . Pravastatin 80 mg once daily  -Current dietary patterns: no changes  -Current exercise habits: trying to stay active outdoors, needing  -Educated on Benefits of statin for ASCVD risk reduction; Importance of limiting foods high in cholesterol; -Counseled on diet and exercise extensively Recommended to continue current medication  Depression (Goal: minimize symptoms) -Controlled per patient report. Has not been as involved in the community as she would like due to pandemic, arthritis related knee ain limiting level of activity and states that she plans to reach out to otho and does not need referral.  -PHQ9 = 0  -Feels good about control of depression  -Current treatment  . Fluoxetine 10 mg once daily  -No side effect concerns at this time   -Counseled on diet and exercise extensively  GERD (minimize symptoms, avoid triggers) -Controlled -Still occasionally having symptoms ~1x/week - is happy with control. Certain meats will provoke reflux, tries not eat late at night. -Current treatment  . Famotidine 20 mg once daily  -Reviewed triggers today - no additional issues noted -Recommend continue medication   Patient Goals/Self-Care Activities - take medications as prescribed target a minimum of 150 minutes of moderate intensity exercise weekly  Medication Assistance: None required.  Patient affirms current coverage meets needs.  Patient's preferred pharmacy [facility]:  [id number] [place]Phone: [phone]: [phone] [place] [address] [place]Phone: [phone]:  [phone] [id number] [place]: [phone]: [phone] pill box? Yes. Pt endorses 100% compliance  Follow Up:  Patient agrees to Care Plan and Follow-up. [facility].     Future Appointments  Date Time Provider [place]4/28/2022  8:30 AM [facility] CCM PHARMACIST [facility]  07/16/2020 10:00 AM TaMidge MiniumMD [facility]  11/04/2020  1:30 PM ElRenato ShinMD [facility] None   JaMadelin RearPharm.D., BCGP Clinical Pharmacist [phone]

## 2020-05-28 NOTE — Patient Instructions (Signed)
Ms. Lazaro,  Thank you for talking with me today. I have included our care plan/goals in the following pages.   Please review and call me at [phone] with any questions.  Thanks! Johnell Comings, Pharm.D., BCGP Clinical Pharmacist [facility] [place] [phone] Patient Care Plan: CCM Pharmacy Care Plan    Problem Identified: MDD HLD Hypothyroidism (following RAI) GERD OA   Priority: High    Long-Range Goal: Disease Management   Start Date: 05/28/2020  Expected End Date: 05/28/2021  This Visit's Progress: On track  Priority: High  Note:   Pharmacist Clinical Goal(s):  Marland Kitchen Over the next 365 days, patient will contact provider office for questions/concerns as evidenced notation of same in electronic health record through collaboration with PharmD and provider.   Interventions: . 1:1 collaboration with Sheliah Hatch, MD regarding development and update of comprehensive plan of care as evidenced by provider attestation and co-signature . Inter-disciplinary care team collaboration (see longitudinal plan of care) . Comprehensive medication review performed; medication list updated in electronic medical record  Hyperlipidemia: (LDL goal < 100) -Not ideally controlled  -labs have been stable, low CV risk -Current treatment: . Pravastatin 80 mg once daily  -Current dietary patterns: no changes  -Current exercise habits: trying to stay active outdoors, needing  -Educated on Benefits of statin for ASCVD risk reduction; Importance of limiting foods high in cholesterol; -Counseled on diet and exercise extensively Recommended to continue current medication  Depression (Goal: minimize symptoms) -Controlled per patient report. Has not been as involved in the community as she would like due to pandemic, arthritis related knee ain limiting level of activity and states that she plans to reach out to otho and does not need referral.   -PHQ9 = 0  -Feels good about control of depression  -Current treatment  . Fluoxetine 10 mg once daily  -No side effect concerns at this time   -Counseled on diet and exercise extensively  GERD (minimize symptoms, avoid triggers) -Controlled -Still occasionally having symptoms ~1x/week - is happy with control. Certain meats will provoke reflux, tries not eat late at night. -Current treatment  . Famotidine 20 mg once daily  -Reviewed triggers today - no additional issues noted -Recommend continue medication   Patient Goals/Self-Care Activities - take medications as prescribed target a minimum of 150 minutes of moderate intensity exercise weekly  Medication Assistance: None required.  Patient affirms current coverage meets needs.  Patient's preferred pharmacy is:  [id number] [place], [place] - [address] [place] [address] Phone: [phone] Fax: [phone]  [facility] [place], [place] - [address] [place], [address] [place], [address] [place] [address] Phone: [phone] Fax: [phone]  [id number] [place], [place] - [address] [place] [address] Phone: [phone] Fax: [phone]  Uses pill box? Yes. Pt endorses 100% compliance  Follow Up:  Patient agrees to Care Plan and Follow-up. Plan: [facility] f/u telephone call 6 months.     The patient verbalized understanding of instructions provided today and agreed to receive a MyChart copy of patient instruction and/or educational materials. Telephone follow up appointment with pharmacy team member scheduled for: See next appointment with "Care Management Staff" under "What's Next" below.

## 2020-05-29 ENCOUNTER — Telehealth: Payer: Medicare Other

## 2020-05-29 LAB — SARS-COV-2, NAA 2 DAY TAT

## 2020-05-29 LAB — NOVEL CORONAVIRUS, NAA: SARS-CoV-2, NAA: DETECTED — AB

## 2020-05-30 ENCOUNTER — Telehealth: Payer: Self-pay | Admitting: Unknown Physician Specialty

## 2020-05-30 NOTE — Telephone Encounter (Signed)
Called to discuss with patient about COVID-19 symptoms and the use of one of the available treatments for those with mild to moderate Covid symptoms and at a high risk of hospitalization.  Pt appears to qualify for outpatient treatment due to co-morbid conditions and/or a member of an at-risk group in accordance with the FDA Emergency Use Authorization.    Symptom onset: Unsure onset, but seems longer than 5 days Vaccinated: yes Booster? yes Qualifiers: age NIH Criteria: 2  Outside of tx benefit window  Tonya Johns

## 2020-06-10 ENCOUNTER — Ambulatory Visit: Payer: Self-pay | Admitting: *Deleted

## 2020-06-10 NOTE — Telephone Encounter (Signed)
Patient's daughter called to request when patient can get her 2nd booster .patient tested positive for covid on 05/29/20. Patient is not having any symptoms at this time and is on day 13 of quarantine. Patient able to receive 2nd booster anytime. Patient scheduled for 2nd booster for [facility]/15/20 at [facility] as requested. Patient and her daughter verbalized understanding .

## 2020-06-15 ENCOUNTER — Ambulatory Visit: Payer: Medicare Other

## 2020-06-15 NOTE — Progress Notes (Signed)
   Covid-19 Vaccination Clinic  Name:  Tonya Johns    MRN: [id number] DOB: 14-Jul-1930  06/15/2020  Tonya Johns was observed post Covid-19 immunization for 15 minutes without incident. She was provided with Vaccine Information Sheet and instruction to access the V-Safe system.   Tonya Johns was instructed to call 911 with any severe reactions post vaccine: Marland Kitchen Difficulty breathing  . Swelling of face and throat  . A fast heartbeat  . A bad rash all over body  . Dizziness and weakness

## 2020-06-16 ENCOUNTER — Other Ambulatory Visit (HOSPITAL_BASED_OUTPATIENT_CLINIC_OR_DEPARTMENT_OTHER): Payer: Self-pay

## 2020-06-16 MED ORDER — PFIZER-BIONT COVID-19 VAC-TRIS 30 MCG/0.3ML IM SUSP
INTRAMUSCULAR | 0 refills | Status: DC
  Filled 2020-06-16: qty 0.3, 1d supply, fill #0

## 2020-06-17 ENCOUNTER — Other Ambulatory Visit: Payer: Self-pay | Admitting: Family Medicine

## 2020-06-19 ENCOUNTER — Encounter: Payer: Self-pay | Admitting: Family Medicine

## 2020-07-16 ENCOUNTER — Other Ambulatory Visit: Payer: Self-pay

## 2020-07-16 ENCOUNTER — Ambulatory Visit (INDEPENDENT_AMBULATORY_CARE_PROVIDER_SITE_OTHER): Payer: Medicare Other | Admitting: Family Medicine

## 2020-07-16 ENCOUNTER — Encounter: Payer: Self-pay | Admitting: Family Medicine

## 2020-07-16 VITALS — BP 120/77 | HR 71 | Temp 97.2°F | Resp 18 | Ht 64.0 in | Wt 191.0 lb

## 2020-07-16 DIAGNOSIS — E785 Hyperlipidemia, unspecified: Secondary | ICD-10-CM | POA: Diagnosis not present

## 2020-07-16 DIAGNOSIS — K219 Gastro-esophageal reflux disease without esophagitis: Secondary | ICD-10-CM

## 2020-07-16 DIAGNOSIS — Z0000 Encounter for general adult medical examination without abnormal findings: Secondary | ICD-10-CM | POA: Diagnosis not present

## 2020-07-16 LAB — CBC WITH DIFFERENTIAL/PLATELET
Basophils Absolute: 0.1 10*3/uL (ref 0.0–0.1)
Basophils Relative: 0.8 % (ref 0.0–3.0)
Eosinophils Absolute: 0.1 10*3/uL (ref 0.0–0.7)
Eosinophils Relative: 1.4 % (ref 0.0–5.0)
HCT: 34.6 % — ABNORMAL LOW (ref 36.0–46.0)
Hemoglobin: 11.7 g/dL — ABNORMAL LOW (ref 12.0–15.0)
Lymphocytes Relative: 28.5 % (ref 12.0–46.0)
Lymphs Abs: 1.9 10*3/uL (ref 0.7–4.0)
MCHC: 33.7 g/dL (ref 30.0–36.0)
MCV: 92 fl (ref 78.0–100.0)
Monocytes Absolute: 0.4 10*3/uL (ref 0.1–1.0)
Monocytes Relative: 5.8 % (ref 3.0–12.0)
Neutro Abs: 4.3 10*3/uL (ref 1.4–7.7)
Neutrophils Relative %: 63.5 % (ref 43.0–77.0)
Platelets: 211 10*3/uL (ref 150.0–400.0)
RBC: 3.76 Mil/uL — ABNORMAL LOW (ref 3.87–5.11)
RDW: 13.7 % (ref 11.5–15.5)
WBC: 6.8 10*3/uL (ref 4.0–10.5)

## 2020-07-16 LAB — HEPATIC FUNCTION PANEL
ALT: 10 U/L (ref 0–35)
AST: 15 U/L (ref 0–37)
Albumin: 4.2 g/dL (ref 3.5–5.2)
Alkaline Phosphatase: 72 U/L (ref 39–117)
Bilirubin, Direct: 0.1 mg/dL (ref 0.0–0.3)
Total Bilirubin: 0.4 mg/dL (ref 0.2–1.2)
Total Protein: 6.8 g/dL (ref 6.0–8.3)

## 2020-07-16 LAB — BASIC METABOLIC PANEL
BUN: 20 mg/dL (ref 6–23)
CO2: 24 mEq/L (ref 19–32)
Calcium: 9.3 mg/dL (ref 8.4–10.5)
Chloride: 106 mEq/L (ref 96–112)
Creatinine, Ser: 1.26 mg/dL — ABNORMAL HIGH (ref 0.40–1.20)
GFR: 38.21 mL/min — ABNORMAL LOW (ref >60.00)
Glucose, Bld: 87 mg/dL (ref 70–99)
Potassium: 4.1 mEq/L (ref 3.5–5.1)
Sodium: 140 mEq/L (ref 135–145)

## 2020-07-16 LAB — LIPID PANEL
Cholesterol: 180 mg/dL (ref 0–200)
HDL: 63.7 mg/dL (ref >39.00)
LDL Cholesterol: 94 mg/dL (ref 0–99)
NonHDL: 115.9
Total CHOL/HDL Ratio: 3
Triglycerides: 112 mg/dL (ref 0.0–149.0)
VLDL: 22.4 mg/dL (ref 0.0–40.0)

## 2020-07-16 LAB — TSH: TSH: 0.59 u[IU]/mL (ref 0.35–4.50)

## 2020-07-16 MED ORDER — OMEPRAZOLE 20 MG PO CPDR: 20.0000 mg | DELAYED_RELEASE_CAPSULE | Freq: Every day | ORAL | 3 refills | Status: DC

## 2020-07-16 NOTE — Assessment & Plan Note (Signed)
Pt's PE unchanged from previous.  No longer doing colon cancer screening or mammogram.  UTD on immunizations.  Check labs.  Anticipatory guidance provided.

## 2020-07-16 NOTE — Assessment & Plan Note (Signed)
Pt reports diarrhea since starting the Famotidine.  She notes improvement in her GERD and is aware if she skips a day, but the onset of diarrhea coincided w/ starting the medication.  Will switch to Omeprazole and see if the diarrhea improves.  Pt expressed understanding and is in agreement w/ plan.

## 2020-07-16 NOTE — Assessment & Plan Note (Signed)
Chronic problem, currently tolerating pravastatin w/o difficulty.  Check labs.  Adjust meds prn

## 2020-07-16 NOTE — Progress Notes (Signed)
   Subjective:    Patient ID: Tonya Johns, female    DOB: 1930/04/06, 87 y.o.   MRN: [id number]  HPI CPE- no longer doing colonoscopy or mammogram.  UTD on immunizations.  Pt reports she's feeling good w/ exception of knee pain  Reviewed past medical, surgical, family and social histories.   Patient Care Team    Relationship Specialty Notifications Start End  Sheliah Hatch, MD PCP - General Family Medicine  08/11/10    Comment: Marcina Millard, MD Consulting Physician Endocrinology  09/05/14   Gean Birchwood, MD Consulting Physician Orthopedic Surgery  12/09/16   Dahlia Byes, [facility] Pharmacist Pharmacist  10/21/19    Comment: [phone]    Health Maintenance  Topic Date Due   INFLUENZA VACCINE  08/31/2020   TETANUS/TDAP  07/14/2029   DEXA SCAN  Completed   COVID-19 Vaccine  Completed   PNA vac Low Risk Adult  Completed   Zoster Vaccines- Shingrix  Completed   HPV VACCINES  Aged Out      Review of Systems Patient reports no vision/ hearing changes, adenopathy,fever, weight change,  persistant/recurrent hoarseness , swallowing issues, chest pain, palpitations, edema, persistant/recurrent cough, hemoptysis, dyspnea (rest/exertional/paroxysmal nocturnal), gastrointestinal bleeding (melena, rectal bleeding), abdominal pain, significant heartburn, GU symptoms (dysuria, hematuria, incontinence), Gyn symptoms (abnormal  bleeding, pain),  syncope, focal weakness, memory loss, numbness & tingling, skin/hair/nail changes, abnormal bruising or bleeding, anxiety, or depression.   + diarrhea- pt reports she will have diarrhea 2-3x/day since starting the Famotidine.  Famotidine is helping w/ the reflux and she is very aware when she doesn't take it.  This visit occurred during the SARS-CoV-2 public health emergency.  Safety protocols were in place, including screening questions prior to the visit, additional usage of staff PPE, and extensive cleaning of exam room while observing appropriate  contact time as indicated for disinfecting solutions.      Objective:   Physical Exam General Appearance:    Alert, cooperative, no distress, appears stated age  Head:    Normocephalic, without obvious abnormality, atraumatic  Eyes:    PERRL, conjunctiva/corneas clear, EOM's intact, fundi    benign, both eyes  Ears:    Normal TM's and external ear canals, both ears  Nose:   Deferred due to COVID  Throat:   Neck:   Supple, symmetrical, trachea midline, no adenopathy;    Thyroid: no enlargement/tenderness/nodules  Back:     Symmetric, no curvature, ROM normal, no CVA tenderness  Lungs:     Clear to auscultation bilaterally, respirations unlabored  Chest Wall:    No tenderness or deformity   Heart:    Regular rate and rhythm, S1 and S2 normal  Breast Exam:    Deferred   Abdomen:     Soft, non-tender, bowel sounds active all four quadrants,    no masses, no organomegaly  Genitalia:    Deferred   Rectal:    Extremities:   Extremities normal, atraumatic, no cyanosis or edema  Pulses:   2+ and symmetric all extremities  Skin:   Skin color, texture, turgor normal, no rashes or lesions  Lymph nodes:   Cervical, supraclavicular, and axillary nodes normal  Neurologic:   CNII-XII intact, normal strength, sensation and reflexes    throughout          Assessment & Plan:

## 2020-07-16 NOTE — Patient Instructions (Signed)
Follow up in 6 months to recheck cholesterol We'll notify you of your lab results and make any changes if needed STOP the Famotidine START the Omeprazole once daily If no improvement in the diarrhea in the next 7-10 days, let me know and we will refer to GI Call with any questions or concerns Stay Safe!  Stay Healthy! Have a great summer!!!

## 2020-07-29 ENCOUNTER — Encounter: Payer: Self-pay | Admitting: *Deleted

## 2020-08-05 ENCOUNTER — Telehealth: Payer: Self-pay

## 2020-08-05 NOTE — Chronic Care Management (AMB) (Signed)
    Chronic Care Management Pharmacy Assistant   Name: Tonya Johns  MRN: [id number] DOB: 1931-01-09  Reason for Encounter: General Adherence Call   Recent office visits:  07/16/20- Tonya Rhymes, MD- seen for physical exam, started omeprazole 20 mg daily, discontinued famotidine, follow up 6 months    Recent consult visits:  No visits noted  Hospital visits:  None in previous 6 months  Medications: Outpatient Encounter Medications as of 08/05/2020  Medication Sig   Coenzyme Q10 (CO Q10) 100 MG CAPS Take 100 mg by mouth at bedtime.   COVID-19 mRNA Vac-TriS, Pfizer, (PFIZER-BIONT COVID-19 VAC-TRIS) SUSP injection Inject into the muscle.   FLUoxetine (PROZAC) 10 MG capsule TAKE 1 CAPSULE BY MOUTH  DAILY   levothyroxine (SYNTHROID) 100 MCG tablet TAKE 1 TABLET BY MOUTH  DAILY   Multiple Vitamin (MULTIVITAMIN WITH MINERALS) TABS tablet Take 1 tablet by mouth at bedtime.   omeprazole (PRILOSEC) 20 MG capsule Take 1 capsule (20 mg total) by mouth daily.   pravastatin (PRAVACHOL) 80 MG tablet TAKE 1 TABLET BY MOUTH  DAILY   No facility-administered encounter medications on file as of 08/05/2020.   I spoke with Tonya Johns this morning and she is doing ok. Since having her famotidine changed to omeprazole, she has seen no difference with the side effects that she was experiencing. She is still having loose stools 2-3 sometimes 4 times a day depending on her water intake. Even though the medication alleviates her acid reflux, the side effects are much of a nuisance. Along with this, she is also having pain in both of her knees. She previously had surgery on her left knee for arthritis and stated that she is experiencing the same  symptoms on her right knee. She stated that sometimes she feels that her right knee is "just going to give out" At times she uses a cane when she "feels unsafe". She tries to walk but the pain gets worse so most times she must sit and elevate her legs. She usually takes 2  tylenols a day when she is in extreme pain. She is able to do some yard work and minute house work with this pain. Tonya Johns is having trouble falling asleep and by her PCP's recommendation she has begun taking melatonin 5 mg. She stated that this has not helped her and she is looking for something else to help her. Overall Tonya Johns has a few things that have been concerning her that she would like advice on.   Have you had any problems recently with your health? Patient stated she has been having increased pain and weakness in both knees  Have you had any problems with your pharmacy? Patient stated she has not had any problems with [facility]   What issues or side effects are you having with your medications? Patient stated that she is still experiencing diarrhea with the new prescription of omeprazole   What would you like me to pass along to [place],CPP for them to help you with?  Patient stated she would like CPP suggestions for sleep as she is using melatonin 5 mg with no relief to her lack of sleep   What can we do to take care of you better? Patient stated she would like help with her knee pain, diarrhea, and sleep hygeine   Star Rating Drugs: Pravastatin 80 mg - 90 DS last filled 04/23/20  Purvis Kilts CPA, CMA

## 2020-08-17 ENCOUNTER — Ambulatory Visit (INDEPENDENT_AMBULATORY_CARE_PROVIDER_SITE_OTHER): Payer: Medicare Other | Admitting: *Deleted

## 2020-08-17 DIAGNOSIS — Z0000 Encounter for general adult medical examination without abnormal findings: Secondary | ICD-10-CM

## 2020-08-17 NOTE — Patient Instructions (Signed)
[name] , Thank you for taking time to come for your Medicare Wellness Visit. I appreciate your ongoing commitment to your health goals. Please review the following plan we discussed and let me know if I can assist you in the future.   Screening recommendations/referrals: Colonoscopy: no longer required Mammogram: no longer required Bone Density: declined at this time Recommended yearly ophthalmology/optometry visit for glaucoma screening and checkup Recommended yearly dental visit for hygiene and checkup  Vaccinations: Influenza vaccine: up to date Pneumococcal vaccine: up to date Tdap vaccine: up to date Shingles vaccine: up to date    Advanced directives: On file  Conditions/risks identified: na  Next appointment 12-9 @ 10:30 Dr. Beverely Low   Preventive Care 21 Years and Older, Female Preventive care refers to lifestyle choices and visits with your health care provider that can promote health and wellness. What does preventive care include? A yearly physical exam. This is also called an annual well check. Dental exams once or twice a year. Routine eye exams. Ask your health care provider how often you should have your eyes checked. Personal lifestyle choices, including: Daily care of your teeth and gums. Regular physical activity. Eating a healthy diet. Avoiding tobacco and drug use. Limiting alcohol use. Practicing safe sex. Taking low-dose aspirin every day. Taking vitamin and mineral supplements as recommended by your health care provider. What happens during an annual well check? The services and screenings done by your health care provider during your annual well check will depend on your age, overall health, lifestyle risk factors, and family history of disease. Counseling  Your health care provider may ask you questions about your: Alcohol use. Tobacco use. Drug use. Emotional well-being. Home and relationship well-being. Sexual activity. Eating  habits. History of falls. Memory and ability to understand (cognition). Work and work Astronomer. Reproductive health. Screening  You may have the following tests or measurements: Height, weight, and BMI. Blood pressure. Lipid and cholesterol levels. These may be checked every 5 years, or more frequently if you are over 20 years old. Skin check. Lung cancer screening. You may have this screening every year starting at age 14 if you have a 30-pack-year history of smoking and currently smoke or have quit within the past 15 years. Fecal occult blood test (FOBT) of the stool. You may have this test every year starting at age 48. Flexible sigmoidoscopy or colonoscopy. You may have a sigmoidoscopy every 5 years or a colonoscopy every 10 years starting at age 5. Hepatitis C blood test. Hepatitis B blood test. Sexually transmitted disease (STD) testing. Diabetes screening. This is done by checking your blood sugar (glucose) after you have not eaten for a while (fasting). You may have this done every 1-3 years. Bone density scan. This is done to screen for osteoporosis. You may have this done starting at age 46. Mammogram. This may be done every 1-2 years. Talk to your health care provider about how often you should have regular mammograms. Talk with your health care provider about your test results, treatment options, and if necessary, the need for more tests. Vaccines  Your health care provider may recommend certain vaccines, such as: Influenza vaccine. This is recommended every year. Tetanus, diphtheria, and acellular pertussis (Tdap, Td) vaccine. You may need a Td booster every 10 years. Zoster vaccine. You may need this after age 93. Pneumococcal 13-valent conjugate (PCV13) vaccine. One dose is recommended after age 48. Pneumococcal polysaccharide (PPSV23) vaccine. One dose is recommended after age 60. Talk to your  health care provider about which screenings and vaccines you need and how  often you need them. This information is not intended to replace advice given to you by your health care provider. Make sure you discuss any questions you have with your health care provider. Document Released: 02/13/2015 Document Revised: 10/07/2015 Document Reviewed: 11/18/2014 Elsevier Interactive Patient Education  2017 [place] Prevention in the Home Falls can cause injuries. They can happen to people of all ages. There are many things you can do to make your home safe and to help prevent falls. What can I do on the outside of my home? Regularly fix the edges of walkways and driveways and fix any cracks. Remove anything that might make you trip as you walk through a door, such as a raised step or threshold. Trim any bushes or trees on the path to your home. Use bright outdoor lighting. Clear any walking paths of anything that might make someone trip, such as rocks or tools. Regularly check to see if handrails are loose or broken. Make sure that both sides of any steps have handrails. Any raised decks and porches should have guardrails on the edges. Have any leaves, snow, or ice cleared regularly. Use sand or salt on walking paths during winter. Clean up any spills in your garage right away. This includes oil or grease spills. What can I do in the bathroom? Use night lights. Install grab bars by the toilet and in the tub and shower. Do not use towel bars as grab bars. Use non-skid mats or decals in the tub or shower. If you need to sit down in the shower, use a plastic, non-slip stool. Keep the floor dry. Clean up any water that spills on the floor as soon as it happens. Remove soap buildup in the tub or shower regularly. Attach bath mats securely with double-sided non-slip rug tape. Do not have throw rugs and other things on the floor that can make you trip. What can I do in the bedroom? Use night lights. Make sure that you have a light by your bed that is easy to  reach. Do not use any sheets or blankets that are too big for your bed. They should not hang down onto the floor. Have a firm chair that has side arms. You can use this for support while you get dressed. Do not have throw rugs and other things on the floor that can make you trip. What can I do in the kitchen? Clean up any spills right away. Avoid walking on wet floors. Keep items that you use a lot in easy-to-reach places. If you need to reach something above you, use a strong step stool that has a grab bar. Keep electrical cords out of the way. Do not use floor polish or wax that makes floors slippery. If you must use wax, use non-skid floor wax. Do not have throw rugs and other things on the floor that can make you trip. What can I do with my stairs? Do not leave any items on the stairs. Make sure that there are handrails on both sides of the stairs and use them. Fix handrails that are broken or loose. Make sure that handrails are as long as the stairways. Check any carpeting to make sure that it is firmly attached to the stairs. Fix any carpet that is loose or worn. Avoid having throw rugs at the top or bottom of the stairs. If you do have throw rugs, attach them  to the floor with carpet tape. Make sure that you have a light switch at the top of the stairs and the bottom of the stairs. If you do not have them, ask someone to add them for you. What else can I do to help prevent falls? Wear shoes that: Do not have high heels. Have rubber bottoms. Are comfortable and fit you well. Are closed at the toe. Do not wear sandals. If you use a stepladder: Make sure that it is fully opened. Do not climb a closed stepladder. Make sure that both sides of the stepladder are locked into place. Ask someone to hold it for you, if possible. Clearly mark and make sure that you can see: Any grab bars or handrails. First and last steps. Where the edge of each step is. Use tools that help you move  around (mobility aids) if they are needed. These include: Canes. Walkers. Scooters. Crutches. Turn on the lights when you go into a dark area. Replace any light bulbs as soon as they burn out. Set up your furniture so you have a clear path. Avoid moving your furniture around. If any of your floors are uneven, fix them. If there are any pets around you, be aware of where they are. Review your medicines with your doctor. Some medicines can make you feel dizzy. This can increase your chance of falling. Ask your doctor what other things that you can do to help prevent falls. This information is not intended to replace advice given to you by your health care provider. Make sure you discuss any questions you have with your health care provider. Document Released: 11/13/2008 Document Revised: 06/25/2015 Document Reviewed: 02/21/2014 Elsevier Interactive Patient Education  2017 [employer].

## 2020-08-17 NOTE — Progress Notes (Signed)
Subjective:   Tonya Johns is a 87 y.o. female who presents for Medicare Annual (Subsequent) preventive examination.  I connected with  Tonya Johns on 08/17/20 by a video enabled telemedicine application and verified that I am speaking with the correct person using two identifiers.   I discussed the limitations of evaluation and management by telemedicine. The patient expressed understanding and agreed to proceed.  Patient location: home  Provider location: tele-health    Review of Systems    NA Cardiac Risk Factors include: advanced age (>6655men, 12>65 women);hypertension     Objective:    Today's Vitals   08/17/20 1419  PainSc: 3    There is no height or weight on file to calculate BMI.  Advanced Directives 08/17/2020 08/13/2019 07/13/2018 03/23/2018 03/23/2017 01/09/2017 01/02/2017  Does Patient Have a Medical Advance Directive? Yes Yes Yes Yes Yes No No  Type of Advance Directive Healthcare Power of Attorney Living will;Healthcare Power of [employer] Street Corporationttorney Healthcare Power of [place]orney;Living will Healthcare Power of [place] Healthcare Power of [place];Living will - -  Copy of Healthcare Power of Attorney in Chart? Yes - validated most recent copy scanned in chart (See row information) No - copy requested No - copy requested - No - copy requested - -  Would patient like information on creating a medical advance directive? - - - - - No - Patient declined Yes (MAU/Ambulatory/Procedural Areas - Information given)    Current Medications (verified) Outpatient Encounter Medications as of 08/17/2020  Medication Sig   Coenzyme Q10 (CO Q10) 100 MG CAPS Take 100 mg by mouth at bedtime.   COVID-19 mRNA Vac-TriS, Pfizer, (PFIZER-BIONT COVID-19 VAC-TRIS) SUSP injection Inject into the muscle.   FLUoxetine (PROZAC) 10 MG capsule TAKE 1 CAPSULE BY MOUTH  DAILY   levothyroxine (SYNTHROID) 100 MCG tablet TAKE 1 TABLET BY MOUTH  DAILY   Multiple Vitamin (MULTIVITAMIN WITH MINERALS) TABS tablet Take 1  tablet by mouth at bedtime.   omeprazole (PRILOSEC) 20 MG capsule Take 1 capsule (20 mg total) by mouth daily.   pravastatin (PRAVACHOL) 80 MG tablet TAKE 1 TABLET BY MOUTH  DAILY   No facility-administered encounter medications on file as of 08/17/2020.    Allergies (verified) Penicillins   History: Past Medical History:  Diagnosis Date   Anemia    Arthritis    Diverticulitis    GERD (gastroesophageal reflux disease)    Hyperlipidemia    Hypothyroidism    Leg swelling    Osteoporosis    Past Surgical History:  Procedure Laterality Date   EYE SURGERY Bilateral    cataract removal   NO PAST SURGERIES     TOTAL KNEE ARTHROPLASTY Left 01/09/2017   Procedure: TOTAL KNEE ARTHROPLASTY;  Surgeon: Gean Birchwoodowan, Frank, MD;  Location: [facility] OR;  Service: Orthopedics;  Laterality: Left;   Family History  Problem Relation Age of Onset   Alcohol abuse Brother    Alcohol abuse Sister    Arthritis Mother    Stroke Mother    Hypertension Mother    Arthritis Father    Heart disease Father    Hyperlipidemia Brother    Hyperlipidemia Sister    Sudden death Brother    Mental illness Sister    Diabetes Sister    Cancer Daughter        breast   Social History   Socioeconomic History   Marital status: Single    Spouse name: Not on file   Number of children: 3   Years of education: Not on  file   Highest education level: Not on file  Occupational History   Occupation: retired  Tobacco Use   Smoking status: Never   Smokeless tobacco: Never  Vaping Use   Vaping Use: Never used  Substance and Sexual Activity   Alcohol use: No   Drug use: No   Sexual activity: Not on file  Other Topics Concern   Not on file  Social History Narrative   Not on file   Social Determinants of Health   Financial Resource Strain: Low Risk    Difficulty of Paying Living Expenses: Not hard at all  Food Insecurity: No Food Insecurity   Worried About Programme researcher, broadcasting/film/video in the Last Year: Never true   Ran  Out of Food in the Last Year: Never true  Transportation Needs: No Transportation Needs   Lack of Transportation (Medical): No   Lack of Transportation (Non-Medical): No  Physical Activity: Insufficiently Active   Days of Exercise per Week: 3 days   Minutes of Exercise per Session: 20 min  Stress: Stress Concern Present   Feeling of Stress : To some extent  Social Connections: Moderately Integrated   Frequency of Communication with Friends and Family: More than three times a week   Frequency of Social Gatherings with Friends and Family: Three times a week   Attends Religious Services: More than 4 times per year   Active Member of Clubs or Organizations: Yes   Attends Banker Meetings: More than 4 times per year   Marital Status: Widowed    Tobacco Counseling Counseling given: Not Answered   Clinical Intake:  Pre-visit preparation completed: Yes  Pain : 0-10 Pain Score: 3  Pain Location: Knee Pain Orientation: Right, Left Pain Descriptors / Indicators: Aching, Burning, Constant Pain Onset: 1 to 4 weeks ago Pain Relieving Factors: voltaren gel/tylenol  Pain Relieving Factors: voltaren gel/tylenol  Nutritional Risks: None Diabetes: No    Diabetic?  NO  Interpreter Needed?: No  Information entered by :: Remi Haggard LPN   Activities of Daily Living In your present state of health, do you have any difficulty performing the following activities: 08/17/2020 07/16/2020  Hearing? N N  Vision? N Y  Difficulty concentrating or making decisions? N N  Walking or climbing stairs? N N  Dressing or bathing? N N  Doing errands, shopping? N N  Preparing Food and eating ? N -  Using the Toilet? N -  In the past six months, have you accidently leaked urine? N -  Do you have problems with loss of bowel control? N -  Managing your Medications? N -  Managing your Finances? N -  Housekeeping or managing your Housekeeping? N -  Some recent data might be hidden     Patient Care Team: Sheliah Hatch, MD as PCP - General (Family Medicine) Romero Belling, MD as Consulting Physician (Endocrinology) Gean Birchwood, MD as Consulting Physician (Orthopedic Surgery) Dahlia Byes, [facility] as Pharmacist (Pharmacist)  Indicate any recent Medical Services you may have received from other than [facility] providers in the past year (date may be approximate).     Assessment:   This is a routine wellness examination for Tonya Johns.  Hearing/Vision screen Hearing Screening - Comments:: No trouble hearing  Vision Screening - Comments:: My eye Doctor Will schedule  Dietary issues and exercise activities discussed: Current Exercise Habits: Home exercise routine, Time (Minutes): 20, Frequency (Times/Week): 3, Weekly Exercise (Minutes/Week): 60, Intensity: Mild   Goals Addressed  This Visit's Progress    Patient Stated   On track    Maintain current health by staying active.       Patient Stated       Maintain current lifestyle       Depression Screen PHQ 2/9 Scores 08/17/2020 07/16/2020 05/28/2020 02/18/2020 08/13/2019 08/13/2019 07/15/2019  PHQ - 2 Score 0 0 0 0 1 2 0  PHQ- 9 Score 0 0 0 0 - 6 0  Exception Documentation - - - - - - -    Fall Risk Fall Risk  08/17/2020 07/16/2020 02/18/2020 08/13/2019 08/13/2019  Falls in the past year? 0 0 0 0 0  Number falls in past yr: 0 0 0 0 0  Injury with Fall? 0 0 0 0 0  Risk for fall due to : - No Fall Risks No Fall Risks - -  Follow up Falls evaluation completed;Falls prevention discussed - - Falls prevention discussed Falls evaluation completed    FALL RISK PREVENTION PERTAINING TO THE HOME:  Any stairs in or around the home? Yes  If so, are there any without handrails? Yes  Home free of loose throw rugs in walkways, pet beds, electrical cords, etc? Yes  Adequate lighting in your home to reduce risk of falls? Yes   ASSISTIVE DEVICES UTILIZED TO PREVENT FALLS:  Life alert? Yes  Use of a cane, walker or  w/c? No  Grab bars in the bathroom? Yes  Shower chair or bench in shower? Yes  Elevated toilet seat or a handicapped toilet? No   TIMED UP AND GO:  Was the test performed? No .   Tele-Health Visit  Cognitive Function: MMSE - Mini Mental State Exam 07/13/2018 03/23/2017 09/05/2014  Not completed: Unable to complete - -  Orientation to time - 5 5  Orientation to Place - 5 5  Registration - 3 3  Attention/ Calculation - 4 5  Recall - 3 2  Language- name 2 objects - 2 2  Language- repeat - 1 1  Language- follow 3 step command - 3 3  Language- read & follow direction - 1 1  Write a sentence - 1 1  Copy design - 0 1  Total score - 28 29     6CIT Screen 08/13/2019  What Year? 0 points  What month? 0 points  What time? 0 points  Count back from 20 0 points  Months in reverse 0 points  Repeat phrase 2 points  Total Score 2    Immunizations Immunization History  Administered Date(s) Administered   Fluad Quad(high Dose 65+) 10/02/2018   Influenza Split 11/25/2010, 11/16/2011   Influenza,inj,Quad PF,6+ Mos 12/06/2012, 11/07/2013, 10/23/2014, 11/26/2015, 09/20/2016, 09/30/2017   Influenza-Unspecified 10/15/2019   PFIZER Comirnaty(Gray Top)Covid-19 Tri-Sucrose Vaccine 06/15/2020   PFIZER(Purple Top)SARS-COV-2 Vaccination 02/10/2019, 03/03/2019, 10/15/2019   Pneumococcal Conjugate-13 02/19/2014   Pneumococcal Polysaccharide-23 03/10/2016   Tdap 07/15/2019   Zoster Recombinat (Shingrix) 04/28/2017, 09/30/2017    TDAP status: Up to date  Flu Vaccine status: Up to date  Pneumococcal vaccine status: Up to date  Covid-19 vaccine status: Completed vaccines  Qualifies for Shingles Vaccine? Yes   Zostavax completed No   Shingrix Completed?: Yes  Screening Tests Health Maintenance  Topic Date Due   INFLUENZA VACCINE  08/31/2020   TETANUS/TDAP  07/14/2029   DEXA SCAN  Completed   COVID-19 Vaccine  Completed   PNA vac Low Risk Adult  Completed   Zoster Vaccines- Shingrix   Completed   HPV VACCINES  Aged Out    Health Maintenance  There are no preventive care reminders to display for this patient.  Colorectal cancer screening: No longer required.   Mammogram status: No longer required due to age.  Bone Density -Patient declined at this time  Lung Cancer Screening: (Low Dose CT Chest recommended if Age 61-80 years, 30 pack-year currently smoking OR have quit w/in 15years.) does not qualify.   Lung Cancer Screening Referral: na  Additional Screening:  Hepatitis C Screening: does not qualify;   Vision Screening: Recommended annual ophthalmology exams for early detection of glaucoma and other disorders of the eye. Is the patient up to date with their annual eye exam?  Yes  Who is the provider or what is the name of the office in which the patient attends annual eye exams? My eye Doctor [facility] If pt is not established with a provider, would they like to be referred to a provider to establish care?  established .   Dental Screening: Recommended annual dental exams for proper oral hygiene  Community Resource Referral / Chronic Care Management: CRR required this visit?  No   CCM required this visit?  No      Plan:     I have personally reviewed and noted the following in the patient's chart:   Medical and social history Use of alcohol, tobacco or illicit drugs  Current medications and supplements including opioid prescriptions.  Functional ability and status Nutritional status Physical activity Advanced directives List of other physicians Hospitalizations, surgeries, and ER visits in previous 12 months Vitals Screenings to include cognitive, depression, and falls Referrals and appointments  In addition, I have reviewed and discussed with patient certain preventive protocols, quality metrics, and best practice recommendations. A written personalized care plan for preventive services as well as general preventive health recommendations  were provided to patient.     Remi Haggard, LPN   [id number]   Nurse Notes: na

## 2020-08-25 DIAGNOSIS — M1711 Unilateral primary osteoarthritis, right knee: Secondary | ICD-10-CM | POA: Diagnosis not present

## 2020-08-25 DIAGNOSIS — Z96652 Presence of left artificial knee joint: Secondary | ICD-10-CM | POA: Diagnosis not present

## 2020-08-25 DIAGNOSIS — Z09 Encounter for follow-up examination after completed treatment for conditions other than malignant neoplasm: Secondary | ICD-10-CM | POA: Diagnosis not present

## 2020-08-27 ENCOUNTER — Telehealth: Payer: Self-pay

## 2020-08-27 NOTE — Chronic Care Management (AMB) (Signed)
    Chronic Care Management Pharmacy Assistant   Name: Tonya Johns  MRN: [id number] DOB: 1930-07-31  Reason for Encounter: Chart Review    Recent office visits:  None  Recent consult visits:  None  Hospital visits:  None in previous 6 months  Medications: Outpatient Encounter Medications as of 08/27/2020  Medication Sig   Coenzyme Q10 (CO Q10) 100 MG CAPS Take 100 mg by mouth at bedtime.   COVID-19 mRNA Vac-TriS, Pfizer, (PFIZER-BIONT COVID-19 VAC-TRIS) SUSP injection Inject into the muscle.   FLUoxetine (PROZAC) 10 MG capsule TAKE 1 CAPSULE BY MOUTH  DAILY   levothyroxine (SYNTHROID) 100 MCG tablet TAKE 1 TABLET BY MOUTH  DAILY   Multiple Vitamin (MULTIVITAMIN WITH MINERALS) TABS tablet Take 1 tablet by mouth at bedtime.   omeprazole (PRILOSEC) 20 MG capsule Take 1 capsule (20 mg total) by mouth daily.   pravastatin (PRAVACHOL) 80 MG tablet TAKE 1 TABLET BY MOUTH  DAILY   No facility-administered encounter medications on file as of 08/27/2020.    Reviewed chart for medication changes.  No OVs, Consults, or hospital visits since last care coordination call/Pharmacist visit.   No medication changes indicated.  Patient has a scheduled follow up appointment with clinical pharmacist.  No gaps in adherence identified.  Future Appointments  Date Time Provider Department Center  11/04/2020  1:30 PM Romero Belling, MD [facility] None  11/26/2020 10:30 AM [facility] PHARMACIST [facility]  01/18/2021 10:30 AM Sheliah Hatch, MD [facility]    April D Calhoun, [facility] Clinical Pharmacist Assistant [phone]

## 2020-09-17 DIAGNOSIS — M1711 Unilateral primary osteoarthritis, right knee: Secondary | ICD-10-CM | POA: Diagnosis not present

## 2020-09-18 ENCOUNTER — Telehealth: Payer: Self-pay

## 2020-09-18 ENCOUNTER — Telehealth: Payer: Self-pay | Admitting: Family Medicine

## 2020-09-18 NOTE — Telephone Encounter (Signed)
APPT ALREADY SCHEDULED TODAY FOR Dr Duke Salvia 10/10/202 Forwarded to requesting party via Epic fax function

## 2020-09-18 NOTE — Telephone Encounter (Signed)
I have placed a surgical clearance request in the bin at the front with a charge sheet.   Last cpe was 07/16/20 with Dr. Beverely Low

## 2020-09-18 NOTE — Telephone Encounter (Signed)
   [facility] Pre-operative Risk Assessment    Patient Name: Tonya Johns  DOB: [birth year] MRN: [id number]  HEARTCARE STAFF:  - IMPORTANT!!!!!! Under Visit Info/Reason for Call, type in Other and utilize the format Clearance MM/DD/YY or Clearance TBD. Do not use dashes or single digits. - Please review there is not already an duplicate clearance open for this procedure. - If request is for dental extraction, please clarify the # of teeth to be extracted. - If the patient is currently at the dentist's office, call Pre-Op Callback Staff (MA/nurse) to input urgent request.  - If the patient is not currently in the dentist office, please route to the Pre-Op pool.  Request for surgical clearance:  What type of surgery is being performed? Right knee arthroplasty under spinal anesthesia   When is this surgery scheduled? TBD  What type of clearance is required (medical clearance vs. Pharmacy clearance to hold med vs. Both)? Both  Are there any medications that need to be held prior to surgery and how long? None noted  Practice name and name of physician performing surgery? [facility]  What is the office phone number? [phone]   7.   What is the office fax number? [phone]  8.   Anesthesia type (None, local, MAC, general) ? General   Gita Kudo 09/18/2020, 1:37 PM  _________________________________________________________________   (provider comments below)

## 2020-09-18 NOTE — Telephone Encounter (Signed)
Primary Cardiologist:Brian Dulce Sellar, MD  Chart reviewed as part of pre-operative protocol coverage. Because of Andrika Larkin's past medical history and time since last visit, he/she will require a follow-up visit in order to better assess preoperative cardiovascular risk.  Pre-op covering staff: - Please schedule appointment and call patient to inform them. - Please contact requesting surgeon's office via preferred method (i.e, phone, fax) to inform them of need for appointment prior to surgery.  If applicable, this message will also be routed to pharmacy pool and/or primary cardiologist for input on holding anticoagulant/antiplatelet agent as requested below so that this information is available at time of patient's appointment.   Ronney Asters, NP  09/18/2020, 1:43 PM

## 2020-09-29 ENCOUNTER — Ambulatory Visit (INDEPENDENT_AMBULATORY_CARE_PROVIDER_SITE_OTHER): Payer: Medicare Other | Admitting: Registered Nurse

## 2020-09-29 ENCOUNTER — Encounter: Payer: Self-pay | Admitting: Registered Nurse

## 2020-09-29 ENCOUNTER — Other Ambulatory Visit: Payer: Self-pay

## 2020-09-29 VITALS — BP 180/80 | HR 66 | Temp 98.2°F | Resp 18 | Ht 64.0 in | Wt 185.2 lb

## 2020-09-29 DIAGNOSIS — Z23 Encounter for immunization: Secondary | ICD-10-CM

## 2020-09-29 DIAGNOSIS — E785 Hyperlipidemia, unspecified: Secondary | ICD-10-CM | POA: Diagnosis not present

## 2020-09-29 DIAGNOSIS — Z01818 Encounter for other preprocedural examination: Secondary | ICD-10-CM | POA: Diagnosis not present

## 2020-09-29 NOTE — Progress Notes (Signed)
Established Patient Office Visit  Subjective:  Patient ID: Tonya Johns, female    DOB: 02-08-1930  Age: 87 y.o. MRN: [id number]  CC:  Chief Complaint  Patient presents with   surgical clearance    HPI Tonya Johns presents for preoperative exam  Upcoming R TKA - no acute concerns, she is s/p L TKA.   Histories reviewed and updated with patient.   Interested in flu shot today.   Past Medical History:  Diagnosis Date   Anemia    Arthritis    Diverticulitis    GERD (gastroesophageal reflux disease)    Hyperlipidemia    Hypothyroidism    Leg swelling    Osteoporosis     Past Surgical History:  Procedure Laterality Date   EYE SURGERY Bilateral    cataract removal   NO PAST SURGERIES     TOTAL KNEE ARTHROPLASTY Left 01/09/2017   Procedure: TOTAL KNEE ARTHROPLASTY;  Surgeon: Gean Birchwood, MD;  Location: [facility] OR;  Service: Orthopedics;  Laterality: Left;    Family History  Problem Relation Age of Onset   Alcohol abuse Brother    Alcohol abuse Sister    Arthritis Mother    Stroke Mother    Hypertension Mother    Arthritis Father    Heart disease Father    Hyperlipidemia Brother    Hyperlipidemia Sister    Sudden death Brother    Mental illness Sister    Diabetes Sister    Cancer Daughter        breast    Social History   Socioeconomic History   Marital status: Single    Spouse name: Not on file   Number of children: 3   Years of education: Not on file   Highest education level: Not on file  Occupational History   Occupation: retired  Tobacco Use   Smoking status: Never   Smokeless tobacco: Never  Vaping Use   Vaping Use: Never used  Substance and Sexual Activity   Alcohol use: No   Drug use: No   Sexual activity: Not on file  Other Topics Concern   Not on file  Social History Narrative   Not on file   Social Determinants of Health   Financial Resource Strain: Low Risk    Difficulty of Paying Living Expenses: Not hard at all  Food Insecurity:  No Food Insecurity   Worried About Programme researcher, broadcasting/film/video in the Last Year: Never true   Ran Out of Food in the Last Year: Never true  Transportation Needs: No Transportation Needs   Lack of Transportation (Medical): No   Lack of Transportation (Non-Medical): No  Physical Activity: Insufficiently Active   Days of Exercise per Week: 3 days   Minutes of Exercise per Session: 20 min  Stress: Stress Concern Present   Feeling of Stress : To some extent  Social Connections: Moderately Integrated   Frequency of Communication with Friends and Family: More than three times a week   Frequency of Social Gatherings with Friends and Family: Three times a week   Attends Religious Services: More than 4 times per year   Active Member of Clubs or Organizations: Yes   Attends Banker Meetings: More than 4 times per year   Marital Status: Widowed  Catering manager Violence: Not At Risk   Fear of Current or Ex-Partner: No   Emotionally Abused: No   Physically Abused: No   Sexually Abused: No    Outpatient Medications Prior to Visit  Medication Sig Dispense Refill   Coenzyme Q10 (CO Q10) 100 MG CAPS Take 100 mg by mouth at bedtime.     FLUoxetine (PROZAC) 10 MG capsule TAKE 1 CAPSULE BY MOUTH  DAILY 90 capsule 3   levothyroxine (SYNTHROID) 100 MCG tablet TAKE 1 TABLET BY MOUTH  DAILY 90 tablet 1   Multiple Vitamin (MULTIVITAMIN WITH MINERALS) TABS tablet Take 1 tablet by mouth at bedtime.     omeprazole (PRILOSEC) 20 MG capsule Take 1 capsule (20 mg total) by mouth daily. 30 capsule 3   pravastatin (PRAVACHOL) 80 MG tablet TAKE 1 TABLET BY MOUTH  DAILY 90 tablet 3   COVID-19 mRNA Vac-TriS, Pfizer, (PFIZER-BIONT COVID-19 VAC-TRIS) SUSP injection Inject into the muscle. (Patient not taking: Reported on 09/29/2020) 0.3 mL 0   traMADol (ULTRAM) 50 MG tablet Take 50 mg by mouth.     No facility-administered medications prior to visit.    Allergies  Allergen Reactions   Penicillins Rash     Did it involve swelling of the face/tongue/throat, SOB, or low BP? No Did it involve sudden or severe rash/hives, skin peeling, or any reaction on the inside of your mouth or nose? Yes Did you need to seek medical attention at a hospital or doctor's office? No When did it last happen?      approx 2005 If all above answers are "NO", may proceed with cephalosporin use.     ROS Review of Systems  Constitutional: Negative.   HENT: Negative.    Eyes: Negative.   Respiratory: Negative.    Cardiovascular: Negative.   Gastrointestinal: Negative.   Genitourinary: Negative.   Musculoskeletal: Negative.   Skin: Negative.   Neurological: Negative.   Psychiatric/Behavioral: Negative.    All other systems reviewed and are negative.    Objective:    Physical Exam Vitals and nursing note reviewed.  Constitutional:      General: She is not in acute distress.    Appearance: Normal appearance. She is normal weight. She is not ill-appearing, toxic-appearing or diaphoretic.  Cardiovascular:     Rate and Rhythm: Normal rate and regular rhythm.     Heart sounds: Normal heart sounds. No murmur heard.   No friction rub. No gallop.  Pulmonary:     Effort: Pulmonary effort is normal. No respiratory distress.     Breath sounds: Normal breath sounds. No stridor. No wheezing, rhonchi or rales.  Chest:     Chest wall: No tenderness.  Skin:    General: Skin is warm and dry.  Neurological:     General: No focal deficit present.     Mental Status: She is alert and oriented to person, place, and time. Mental status is at baseline.  Psychiatric:        Mood and Affect: Mood normal.        Behavior: Behavior normal.        Thought Content: Thought content normal.        Judgment: Judgment normal.    BP (!) 180/80   Pulse 66   Temp 98.2 F (36.8 C) (Temporal)   Resp 18   Ht 5\' 4"  (1.626 m)   Wt 185 lb 3.2 oz (84 kg)   SpO2 98%   BMI 31.79 kg/m  Wt Readings from Last 3 Encounters:  09/29/20  185 lb 3.2 oz (84 kg)  07/16/20 191 lb (86.6 kg)  11/04/19 194 lb 6.4 oz (88.2 kg)     There are no preventive care reminders to display  for this patient.   There are no preventive care reminders to display for this patient.  Lab Results  Component Value Date   TSH 0.59 07/16/2020   Lab Results  Component Value Date   WBC 6.8 07/16/2020   HGB 11.7 (L) 07/16/2020   HCT 34.6 (L) 07/16/2020   MCV 92.0 07/16/2020   PLT 211.0 07/16/2020   Lab Results  Component Value Date   NA 140 07/16/2020   K 4.1 07/16/2020   CO2 24 07/16/2020   GLUCOSE 87 07/16/2020   BUN 20 07/16/2020   CREATININE 1.26 (H) 07/16/2020   BILITOT 0.4 07/16/2020   ALKPHOS 72 07/16/2020   AST 15 07/16/2020   ALT 10 07/16/2020   PROT 6.8 07/16/2020   ALBUMIN 4.2 07/16/2020   CALCIUM 9.3 07/16/2020   ANIONGAP 9 03/23/2018   GFR 38.21 (L) 07/16/2020   Lab Results  Component Value Date   CHOL 180 07/16/2020   Lab Results  Component Value Date   HDL 63.70 07/16/2020   Lab Results  Component Value Date   LDLCALC 94 07/16/2020   Lab Results  Component Value Date   TRIG 112.0 07/16/2020   Lab Results  Component Value Date   CHOLHDL 3 07/16/2020   Lab Results  Component Value Date   HGBA1C 6.3 11/25/2010      Assessment & Plan:   Problem List Items Addressed This Visit   None Visit Diagnoses     Preoperative clearance    -  Primary   Relevant Orders   EKG 12-Lead (Completed)   CBC with Differential/Platelet   Comprehensive metabolic panel   Hemoglobin A1c   TSH   PTT   Protime-INR   Urinalysis   Flu vaccine need       Relevant Orders   Flu Vaccine QUAD High Dose(Fluad) (Completed)       No orders of the defined types were placed in this encounter.   Follow-up: Return if symptoms worsen or fail to improve.   PLAN EKG compared to reading from 03/29/18. No acute changes. No acute concerns on reading. Shows likely LVH. Computer interpretation gives anterior fasicular block  but RRR correlating to normal rhythm on exam. Overall no acute concerns and no preclusion to surgery.  Exam unremarkable Labs collected. Will follow up with the patient as warranted. Flu shot high dose given Patient encouraged to call clinic with any questions, comments, or concerns.  Janeece Agee, NP

## 2020-09-29 NOTE — Patient Instructions (Signed)
[name] to meet you  Pending labs, you're cleared for surgery. I will fax paperwork, ekg, and labs to the orthopedic office.  I will contact you if there's anything worrisome on lab work.  Let me know if you need anything   Rich

## 2020-09-30 ENCOUNTER — Other Ambulatory Visit: Payer: Self-pay | Admitting: Registered Nurse

## 2020-09-30 ENCOUNTER — Encounter: Payer: Self-pay | Admitting: Registered Nurse

## 2020-09-30 DIAGNOSIS — Z01818 Encounter for other preprocedural examination: Secondary | ICD-10-CM

## 2020-09-30 LAB — CBC WITH DIFFERENTIAL/PLATELET
Basophils Absolute: 0.1 10*3/uL (ref 0.0–0.1)
Basophils Relative: 1 % (ref 0.0–3.0)
Eosinophils Absolute: 0.1 10*3/uL (ref 0.0–0.7)
Eosinophils Relative: 0.9 % (ref 0.0–5.0)
HCT: 34.6 % — ABNORMAL LOW (ref 36.0–46.0)
Hemoglobin: 11.5 g/dL — ABNORMAL LOW (ref 12.0–15.0)
Lymphocytes Relative: 33.2 % (ref 12.0–46.0)
Lymphs Abs: 2.3 10*3/uL (ref 0.7–4.0)
MCHC: 33.2 g/dL (ref 30.0–36.0)
MCV: 92 fl (ref 78.0–100.0)
Monocytes Absolute: 0.6 10*3/uL (ref 0.1–1.0)
Monocytes Relative: 8.3 % (ref 3.0–12.0)
Neutro Abs: 3.9 10*3/uL (ref 1.4–7.7)
Neutrophils Relative %: 56.6 % (ref 43.0–77.0)
Platelets: 220 10*3/uL (ref 150.0–400.0)
RBC: 3.76 Mil/uL — ABNORMAL LOW (ref 3.87–5.11)
RDW: 13 % (ref 11.5–15.5)
WBC: 7 10*3/uL (ref 4.0–10.5)

## 2020-09-30 LAB — URINALYSIS, ROUTINE W REFLEX MICROSCOPIC
Bilirubin Urine: NEGATIVE
Hgb urine dipstick: NEGATIVE
Ketones, ur: NEGATIVE
Nitrite: NEGATIVE
RBC / HPF: NONE SEEN (ref 0–?)
Specific Gravity, Urine: 1.015 (ref 1.000–1.030)
Total Protein, Urine: NEGATIVE
Urine Glucose: NEGATIVE
Urobilinogen, UA: 0.2 (ref 0.0–1.0)
pH: 6 (ref 5.0–8.0)

## 2020-09-30 LAB — COMPREHENSIVE METABOLIC PANEL
ALT: 10 U/L (ref 0–35)
AST: 15 U/L (ref 0–37)
Albumin: 4.1 g/dL (ref 3.5–5.2)
Alkaline Phosphatase: 77 U/L (ref 39–117)
BUN: 15 mg/dL (ref 6–23)
CO2: 27 mEq/L (ref 19–32)
Calcium: 9.7 mg/dL (ref 8.4–10.5)
Chloride: 103 mEq/L (ref 96–112)
Creatinine, Ser: 1.42 mg/dL — ABNORMAL HIGH (ref 0.40–1.20)
GFR: 33.05 mL/min — ABNORMAL LOW (ref >60.00)
Glucose, Bld: 86 mg/dL (ref 70–99)
Potassium: 3.9 mEq/L (ref 3.5–5.1)
Sodium: 139 mEq/L (ref 135–145)
Total Bilirubin: 0.4 mg/dL (ref 0.2–1.2)
Total Protein: 6.5 g/dL (ref 6.0–8.3)

## 2020-09-30 LAB — PROTIME-INR
INR: 1 ratio (ref 0.8–1.0)
Prothrombin Time: 11.3 s (ref 9.6–13.1)

## 2020-09-30 LAB — APTT: aPTT: 30.6 s (ref 23.4–32.7)

## 2020-09-30 LAB — HEMOGLOBIN A1C: Hgb A1c MFr Bld: 6 % (ref 4.6–6.5)

## 2020-09-30 LAB — TSH: TSH: 1.48 u[IU]/mL (ref 0.35–5.50)

## 2020-10-01 ENCOUNTER — Encounter: Payer: Self-pay | Admitting: Registered Nurse

## 2020-10-01 ENCOUNTER — Other Ambulatory Visit: Payer: Self-pay

## 2020-10-01 DIAGNOSIS — Z01818 Encounter for other preprocedural examination: Secondary | ICD-10-CM

## 2020-10-01 DIAGNOSIS — R8271 Bacteriuria: Secondary | ICD-10-CM

## 2020-10-01 NOTE — Progress Notes (Signed)
Patient has some bacteria in urine. Per provider a culture needs to be done. Patient will come in before lunch time on 10/02/20 to leave a sample.

## 2020-10-02 ENCOUNTER — Other Ambulatory Visit: Payer: Self-pay

## 2020-10-02 ENCOUNTER — Ambulatory Visit: Payer: Medicare Other | Admitting: Registered Nurse

## 2020-10-02 DIAGNOSIS — Z01818 Encounter for other preprocedural examination: Secondary | ICD-10-CM

## 2020-10-02 DIAGNOSIS — R8271 Bacteriuria: Secondary | ICD-10-CM | POA: Diagnosis not present

## 2020-10-02 NOTE — Addendum Note (Signed)
Addended by: Eldred Manges on: 10/02/2020 10:46 AM   Modules accepted: Orders

## 2020-10-02 NOTE — Progress Notes (Signed)
Patient present to the office today to leave a urine sample for a culture.

## 2020-10-03 LAB — URINE CULTURE
MICRO NUMBER:: 12328475
SPECIMEN QUALITY:: ADEQUATE

## 2020-10-03 LAB — EXTRA URINE SPECIMEN

## 2020-10-07 ENCOUNTER — Telehealth: Payer: Self-pay

## 2020-10-07 NOTE — Progress Notes (Signed)
    Chronic Care Management Pharmacy Assistant   Name: Tonya Johns  MRN: [id number] DOB: 06/18/1930   Reason for Encounter: Disease State - General Adherence Call / Schedule CPP Call    Recent office visits:  09/29/20 Tonya Finner, NP - Family Medicine- PreOp Clearance - Labs were ordered. EKG obtained. Flu vaccine given. No medication changes. Follow up as warranted.   Recent consult visits:  None noted.   Hospital visits:  None in previous 6 months  Medications: Outpatient Encounter Medications as of 10/07/2020  Medication Sig   Coenzyme Q10 (CO Q10) 100 MG CAPS Take 100 mg by mouth at bedtime.   COVID-19 mRNA Vac-TriS, Pfizer, (PFIZER-BIONT COVID-19 VAC-TRIS) SUSP injection Inject into the muscle. (Patient not taking: Reported on 09/29/2020)   FLUoxetine (PROZAC) 10 MG capsule TAKE 1 CAPSULE BY MOUTH  DAILY   levothyroxine (SYNTHROID) 100 MCG tablet TAKE 1 TABLET BY MOUTH  DAILY   Multiple Vitamin (MULTIVITAMIN WITH MINERALS) TABS tablet Take 1 tablet by mouth at bedtime.   omeprazole (PRILOSEC) 20 MG capsule Take 1 capsule (20 mg total) by mouth daily.   pravastatin (PRAVACHOL) 80 MG tablet TAKE 1 TABLET BY MOUTH  DAILY   traMADol (ULTRAM) 50 MG tablet Take 50 mg by mouth.   No facility-administered encounter medications on file as of 10/07/2020.    Have you had any problems recently with your health? Patient reported she is waiting for pre op from Cardiology to have knee surgery on her right knee. She recently had left knee surgery and fell on her right knee last month and now is requiring surgery.   Have you had any problems with your pharmacy? Patient denied any problems with her current pharmacy.   What issues or side effects are you having with your medications? Patient denied having any side effects or issues with her medications.   What would you like me to pass along to Tonya Johns, CPP for them to help you with?  Patient did not have anything she would like to pass  along to CPP at this time.    What can we do to take care of you better? Patient did not have any recommendations at this time. She is satisfied with her current level of care.   Care Gaps  Colonoscopy: done 01/31/10 DEXA: done 09/02/13 Influenza: completed Mammogram: done 09/24/15 Diabetes Eye Exam: N/A Annual Well Visit: done 08/17/20 PAP: N/A Tetanus: due 07/14/29 Zoster: completed  HIV screening: never PSA:N/A    Star Rating Drugs: pravastatin (PRAVACHOL) 80 MG tablet - last filled 04/23/20 90 days    Future Appointments  Date Time Provider Department Center  11/04/2020  1:30 PM Tonya Belling, MD [facility] None  11/04/2020  4:00 PM Croitoru, Rachelle Hora, MD [facility]  11/26/2020 10:30 AM [facility] PHARMACIST [facility]  01/18/2021 10:30 AM Tonya Hatch, MD [facility]   Patient wished to wait until her after her knee surgery to schedule an appt. With CPP.  Tonya Johns, CCMA Clinical Pharmacist Assistant  [phone]  Time Spent: 40 minutes

## 2020-10-08 ENCOUNTER — Other Ambulatory Visit: Payer: Self-pay

## 2020-10-08 ENCOUNTER — Ambulatory Visit (HOSPITAL_BASED_OUTPATIENT_CLINIC_OR_DEPARTMENT_OTHER): Payer: Medicare Other | Admitting: Cardiology

## 2020-10-08 ENCOUNTER — Encounter (HOSPITAL_BASED_OUTPATIENT_CLINIC_OR_DEPARTMENT_OTHER): Payer: Self-pay | Admitting: Cardiology

## 2020-10-08 VITALS — BP 142/78 | HR 70 | Ht 64.0 in | Wt 185.5 lb

## 2020-10-08 DIAGNOSIS — I351 Nonrheumatic aortic (valve) insufficiency: Secondary | ICD-10-CM

## 2020-10-08 DIAGNOSIS — Z7189 Other specified counseling: Secondary | ICD-10-CM

## 2020-10-08 DIAGNOSIS — I447 Left bundle-branch block, unspecified: Secondary | ICD-10-CM | POA: Diagnosis not present

## 2020-10-08 DIAGNOSIS — E785 Hyperlipidemia, unspecified: Secondary | ICD-10-CM

## 2020-10-08 DIAGNOSIS — R030 Elevated blood-pressure reading, without diagnosis of hypertension: Secondary | ICD-10-CM | POA: Diagnosis not present

## 2020-10-08 DIAGNOSIS — Z01810 Encounter for preprocedural cardiovascular examination: Secondary | ICD-10-CM | POA: Diagnosis not present

## 2020-10-08 NOTE — Patient Instructions (Signed)
Medication Instructions:  Your Physician recommend you continue on your current medication as directed.    *If you need a refill on your cardiac medications before your next appointment, please call your pharmacy*   Lab Work: None ordered today   Testing/Procedures: None ordered today   Follow-Up: At [facility], you and your health needs are our priority.  As part of our continuing mission to provide you with exceptional heart care, we have created designated Provider Care Teams.  These Care Teams include your primary Cardiologist (physician) and Advanced Practice Providers (APPs -  Physician Assistants and Nurse Practitioners) who all work together to provide you with the care you need, when you need it.  We recommend signing up for the patient portal called "MyChart".  Sign up information is provided on this After Visit Summary.  MyChart is used to connect with patients for Virtual Visits (Telemedicine).  Patients are able to view lab/test results, encounter notes, upcoming appointments, etc.  Non-urgent messages can be sent to your provider as well.   To learn more about what you can do with MyChart, go to [email or web address].    Your next appointment:   2 year(s)  The format for your next appointment:   In Person  Provider:   Bridgette Christopher, MD{        

## 2020-10-08 NOTE — Progress Notes (Signed)
Cardiology Office Note:    Date:  10/08/2020   ID:  Tonya Johns, DOB Jun 26, 1930, MRN [id number]  PCP:  Sheliah Hatch, MD  Cardiologist:  Jodelle Red, MD  Referring MD: Sheliah Hatch, MD   CC: new patient evaluation for preoperative cardiovascular exam  History of Present Illness:    Tonya Johns is a 87 y.o. female with a hx of hypertension who is seen as a new consult at the request of Beverely Low, Helane Rima, MD for the evaluation and management of preoperative cardiovascular evaluation. She was previously seen by Dr. Dulce Sellar but has not been seen by cardiology since 2018.  Planned surgery: date TBD, right knee arthroplasty, Dr. Turner Daniels  Pertinent past cardiac history: none Prior cardiac workup: echo History of valve disease: mild-moderate AI History of CAD/PAD/CVA/TIA: none History of heart failure: none History of arrhythmia: none On anticoagulation: none History of hypertension: none History of diabetes: none History of CKD: none History of OSA: none History of anesthesia complications: none Current symptoms: Denies chest pain, shortness of breath at rest or with normal exertion. No PND, orthopnea, LE edema or unexpected weight gain. No syncope or palpitations.  Functional capacity: has to take the stairs to get in/outside, no symptoms other than knee pain. Walks with only limitation of knee pain, uses a cane to avoid falling.  Past Medical History:  Diagnosis Date   Anemia    Arthritis    Diverticulitis    GERD (gastroesophageal reflux disease)    Hyperlipidemia    Hypothyroidism    Leg swelling    Osteoporosis     Past Surgical History:  Procedure Laterality Date   EYE SURGERY Bilateral    cataract removal   NO PAST SURGERIES     TOTAL KNEE ARTHROPLASTY Left 01/09/2017   Procedure: TOTAL KNEE ARTHROPLASTY;  Surgeon: Gean Birchwood, MD;  Location: [facility] OR;  Service: Orthopedics;  Laterality: Left;    Current Medications: Current Outpatient  Medications on File Prior to Visit  Medication Sig   Coenzyme Q10 (CO Q10) 100 MG CAPS Take 100 mg by mouth at bedtime.   FLUoxetine (PROZAC) 10 MG capsule TAKE 1 CAPSULE BY MOUTH  DAILY   levothyroxine (SYNTHROID) 100 MCG tablet TAKE 1 TABLET BY MOUTH  DAILY   Multiple Vitamin (MULTIVITAMIN WITH MINERALS) TABS tablet Take 1 tablet by mouth at bedtime.   omeprazole (PRILOSEC) 20 MG capsule Take 1 capsule (20 mg total) by mouth daily.   pravastatin (PRAVACHOL) 80 MG tablet TAKE 1 TABLET BY MOUTH  DAILY   traMADol (ULTRAM) 50 MG tablet Take 50 mg by mouth.   No current facility-administered medications on file prior to visit.     Allergies:   Penicillins   Social History   Tobacco Use   Smoking status: Never   Smokeless tobacco: Never  Vaping Use   Vaping Use: Never used  Substance Use Topics   Alcohol use: No   Drug use: No    Family History: family history includes Alcohol abuse in her brother and sister; Arthritis in her father and mother; Cancer in her daughter; Diabetes in her sister; Heart disease in her father; Hyperlipidemia in her brother and sister; Hypertension in her mother; Mental illness in her sister; Stroke in her mother; Sudden death in her brother.  ROS:   Please see the history of present illness.  Additional pertinent ROS: Constitutional: Negative for chills, fever, night sweats, unintentional weight loss  HENT: Negative for ear pain and hearing loss.  Eyes: Negative for loss of vision and eye pain.  Respiratory: Negative for cough, sputum, wheezing.   Cardiovascular: See HPI. Gastrointestinal: Negative for abdominal pain, melena, and hematochezia.  Genitourinary: Negative for dysuria and hematuria.  Musculoskeletal: Negative for falls and myalgias.  Skin: Negative for itching and rash.  Neurological: Negative for focal weakness, focal sensory changes and loss of consciousness.  Endo/Heme/Allergies: Does not bruise/bleed easily.     EKGs/Labs/Other  Studies Reviewed:    The following studies were reviewed today: Echo 03/24/2018  1. The left ventricle has normal systolic function with an ejection  fraction of 60-65%. The cavity size was normal. There is mild asymmetric left ventricular hypertrophy of the anteroseptal wall. Left ventricular diastolic Doppler parameters are  consistent with impaired relaxation.   2. The right ventricle has normal systolic function. The cavity was  normal. There is no increase in right ventricular wall thickness.   3. Right atrial size was mildly dilated.   4. The mitral valve is degenerative.   5. The tricuspid valve is normal in structure and degenerative.   6. The aortic valve is tricuspid Mild thickening of the aortic valve Mild calcification of the aortic valve. Aortic valve regurgitation is mild to moderate by color flow Doppler. The jet is centrally-directed.   7. The pulmonic valve was normal in structure.   EKG:  EKG is personally reviewed.   10/08/20 NSR at 70 bpm, iLBBB  Recent Labs: 09/29/2020: ALT 10; BUN 15; Creatinine, Ser 1.42; Hemoglobin 11.5; Platelets 220.0; Potassium 3.9; Sodium 139; TSH 1.48  Recent Lipid Panel    Component Value Date/Time   CHOL 180 07/16/2020 1038   TRIG 112.0 07/16/2020 1038   HDL 63.70 07/16/2020 1038   CHOLHDL 3 07/16/2020 1038   VLDL 22.4 07/16/2020 1038   LDLCALC 94 07/16/2020 1038   LDLCALC 115 (H) 01/07/2019 1131   LDLDIRECT 124.0 09/14/2015 1355    Physical Exam:    VS:  BP (!) 142/78   Pulse 70   Ht 5\' 4"  (1.626 m)   Wt 185 lb 8 oz (84.1 kg)   SpO2 98%   BMI 31.84 kg/m     Wt Readings from Last 3 Encounters:  10/08/20 185 lb 8 oz (84.1 kg)  09/29/20 185 lb 3.2 oz (84 kg)  07/16/20 191 lb (86.6 kg)    GEN: Well nourished, well developed in no acute distress HEENT: Normal, moist mucous membranes NECK: No JVD CARDIAC: regular rhythm, normal S1 and S2, no rubs or gallops. No murmur. VASCULAR: Radial and DP pulses 2+ bilaterally. No carotid  bruits RESPIRATORY:  Clear to auscultation without rales, wheezing or rhonchi  ABDOMEN: Soft, non-tender, non-distended MUSCULOSKELETAL:  Ambulates independently SKIN: Warm and dry, no edema NEUROLOGIC:  Alert and oriented x 3. No focal neuro deficits noted. PSYCHIATRIC:  Normal affect    ASSESSMENT:    1. Preop cardiovascular exam   2. Incomplete left bundle branch block (LBBB)   3. Nonrheumatic aortic valve insufficiency   4. Hyperlipidemia, unspecified hyperlipidemia type   5. Cardiac risk counseling   6. Counseling on health promotion and disease prevention   7. Elevated blood pressure reading in office without diagnosis of hypertension    PLAN:    Preoperative cardiovascular risk assessment: Based on available date, patient's RCRI score = 0, which carries a 3.9% 30-day risk of death, MI, or cardiac arrest.  The patient is not currently having active cardiac symptoms, and they can achieve >4 METs of activity.  According to  ACC/AHA Guidelines, no further testing is needed.  Proceed with surgery at acceptable risk.  Our service is available as needed in the peri-operative period.     Hyperlipidemia: -on pravastatin, continue -LDL from 07/2020 94, no known CAD, at goal  Incomplete LBBB -monitor yearly  Mild-moderate aortic insufficiency -last echo 2020 -asymptomatic -repeat echo for symptoms or in 2025  Elevated blood pressure reading, without diagnosis of hypertension -she has had pain from her knee. Not at threshold for treatment currently, but if remains elevated may need to discuss antihypertensive  Cardiac risk counseling and prevention recommendations: -recommend heart healthy/Mediterranean diet, with whole grains, fruits, vegetable, fish, lean meats, nuts, and olive oil. Limit salt. -recommend moderate walking, 3-5 times/week for 30-50 minutes each session. Aim for at least 150 minutes.week. Goal should be pace of 3 miles/hours, or walking 1.5 miles in 30  minutes -recommend avoidance of tobacco products. Avoid excess alcohol. -ASCVD risk score: The ASCVD Risk score (Arnett DK, et al., 2019) failed to calculate for the following reasons:   The 2019 ASCVD risk score is only valid for ages 35 to 71    Plan for follow up: 2 years  Jodelle Red, MD, PhD, [facility]  [facility]    Medication Adjustments/Labs and Tests Ordered: Current medicines are reviewed at length with the patient today.  Concerns regarding medicines are outlined above.  Orders Placed This Encounter  Procedures   EKG 12-Lead    No orders of the defined types were placed in this encounter.   Patient Instructions  Medication Instructions:  Your Physician recommend you continue on your current medication as directed.    *If you need a refill on your cardiac medications before your next appointment, please call your pharmacy*   Lab Work: None ordered today   Testing/Procedures: None ordered today   Follow-Up: At [facility], you and your health needs are our priority.  As part of our continuing mission to provide you with exceptional heart care, we have created designated Provider Care Teams.  These Care Teams include your primary Cardiologist (physician) and Advanced Practice Providers (APPs -  Physician Assistants and Nurse Practitioners) who all work together to provide you with the care you need, when you need it.  We recommend signing up for the patient portal called "MyChart".  Sign up information is provided on this After Visit Summary.  MyChart is used to connect with patients for Virtual Visits (Telemedicine).  Patients are able to view lab/test results, encounter notes, upcoming appointments, etc.  Non-urgent messages can be sent to your provider as well.   To learn more about what you can do with MyChart, go to [email or web address].    Your next appointment:   2 year(s)  The format for your next appointment:   In  Person  Provider:   Jodelle Red, MD    Signed, Jodelle Red, MD PhD 10/08/2020 9:09 AM    [facility]

## 2020-10-12 ENCOUNTER — Other Ambulatory Visit: Payer: Self-pay | Admitting: Family Medicine

## 2020-10-14 ENCOUNTER — Encounter: Payer: Self-pay | Admitting: Family Medicine

## 2020-10-15 DIAGNOSIS — Z03818 Encounter for observation for suspected exposure to other biological agents ruled out: Secondary | ICD-10-CM | POA: Diagnosis not present

## 2020-10-15 MED ORDER — PANTOPRAZOLE SODIUM 40 MG PO TBEC: 40.0000 mg | DELAYED_RELEASE_TABLET | Freq: Every day | ORAL | 3 refills | Status: DC

## 2020-10-27 ENCOUNTER — Ambulatory Visit: Payer: Medicare Other | Attending: Internal Medicine

## 2020-10-27 DIAGNOSIS — Z23 Encounter for immunization: Secondary | ICD-10-CM

## 2020-10-27 NOTE — Progress Notes (Signed)
   Covid-19 Vaccination Clinic  Name:  Tonya Johns    MRN: [id number] DOB: 1930/05/31  10/27/2020  [name] was observed post Covid-19 immunization for 15 minutes without incident. She was provided with Vaccine Information Sheet and instruction to access the V-Safe system.   [name] was instructed to call 911 with any severe reactions post vaccine: Difficulty breathing  Swelling of face and throat  A fast heartbeat  A bad rash all over body  Dizziness and weakness

## 2020-11-04 ENCOUNTER — Ambulatory Visit (INDEPENDENT_AMBULATORY_CARE_PROVIDER_SITE_OTHER): Payer: Medicare Other | Admitting: Endocrinology

## 2020-11-04 ENCOUNTER — Ambulatory Visit: Payer: Medicare Other | Admitting: Cardiovascular Disease

## 2020-11-04 ENCOUNTER — Other Ambulatory Visit: Payer: Self-pay

## 2020-11-04 VITALS — BP 150/64 | HR 70 | Ht 64.0 in | Wt 183.8 lb

## 2020-11-04 DIAGNOSIS — E890 Postprocedural hypothyroidism: Secondary | ICD-10-CM

## 2020-11-04 NOTE — Patient Instructions (Addendum)
Please continue the same levothyroxine.  most of the time, a "lumpy thyroid" will eventually become overactive again.  When this happens, we would first notice that you would need less thyroid medication.   All you need for the thyroid lump is to have Tonya Johns examine your neck each year, and to do the annual blood test. I would be happy to see you back here as needed.

## 2020-11-04 NOTE — Progress Notes (Signed)
Subjective:    Patient ID: Tonya Johns, female    DOB: [birth year], 87 y.o.   MRN: [id number]  HPI Pt returns for f/u of post-RAI hypothyroidism (in 2012, she had RAI, for hyperthyroidism due to multinodular goiter; she then started synthroid; [place] in 2016 was only minimally changed; she declines any more [place]).  She does not notice the goiter.  pt states she feels well in general.  She takes synthroid as rx'ed.  Past Medical History:  Diagnosis Date   Anemia    Arthritis    Diverticulitis    GERD (gastroesophageal reflux disease)    Hyperlipidemia    Hypothyroidism    Leg swelling    Osteoporosis     Past Surgical History:  Procedure Laterality Date   EYE SURGERY Bilateral    cataract removal   NO PAST SURGERIES     TOTAL KNEE ARTHROPLASTY Left 01/09/2017   Procedure: TOTAL KNEE ARTHROPLASTY;  Surgeon: Gean Birchwood, MD;  Location: [facility] OR;  Service: Orthopedics;  Laterality: Left;    Social History   Socioeconomic History   Marital status: Single    Spouse name: Not on file   Number of children: 3   Years of education: Not on file   Highest education level: Not on file  Occupational History   Occupation: retired  Tobacco Use   Smoking status: Never   Smokeless tobacco: Never  Vaping Use   Vaping Use: Never used  Substance and Sexual Activity   Alcohol use: No   Drug use: No   Sexual activity: Not on file  Other Topics Concern   Not on file  Social History Narrative   Not on file   Social Determinants of Health   Financial Resource Strain: Low Risk    Difficulty of Paying Living Expenses: Not hard at all  Food Insecurity: No Food Insecurity   Worried About Programme researcher, broadcasting/film/video in the Last Year: Never true   Ran Out of Food in the Last Year: Never true  Transportation Needs: No Transportation Needs   Lack of Transportation (Medical): No   Lack of Transportation (Non-Medical): No  Physical Activity: Insufficiently Active   Days of Exercise per Week: 3 days    Minutes of Exercise per Session: 20 min  Stress: Stress Concern Present   Feeling of Stress : To some extent  Social Connections: Moderately Integrated   Frequency of Communication with Friends and Family: More than three times a week   Frequency of Social Gatherings with Friends and Family: Three times a week   Attends Religious Services: More than 4 times per year   Active Member of Clubs or Organizations: Yes   Attends Banker Meetings: More than 4 times per year   Marital Status: Widowed  Catering manager Violence: Not At Risk   Fear of Current or Ex-Partner: No   Emotionally Abused: No   Physically Abused: No   Sexually Abused: No    Current Outpatient Medications on File Prior to Visit  Medication Sig Dispense Refill   Coenzyme Q10 (CO Q10) 100 MG CAPS Take 100 mg by mouth at bedtime.     FLUoxetine (PROZAC) 10 MG capsule TAKE 1 CAPSULE BY MOUTH  DAILY 90 capsule 3   levothyroxine (SYNTHROID) 100 MCG tablet TAKE 1 TABLET BY MOUTH  DAILY 90 tablet 1   Multiple Vitamin (MULTIVITAMIN WITH MINERALS) TABS tablet Take 1 tablet by mouth at bedtime.     pantoprazole (PROTONIX) 40 MG tablet Take  1 tablet (40 mg total) by mouth daily. 30 tablet 3   pravastatin (PRAVACHOL) 80 MG tablet TAKE 1 TABLET BY MOUTH  DAILY 90 tablet 3   traMADol (ULTRAM) 50 MG tablet Take 50 mg by mouth.     No current facility-administered medications on file prior to visit.    Allergies  Allergen Reactions   Penicillins Rash    Did it involve swelling of the face/tongue/throat, SOB, or low BP? No Did it involve sudden or severe rash/hives, skin peeling, or any reaction on the inside of your mouth or nose? Yes Did you need to seek medical attention at a hospital or doctor's office? No When did it last happen?      approx 2005 If all above answers are "NO", may proceed with cephalosporin use.     Family History  Problem Relation Age of Onset   Alcohol abuse Brother    Alcohol abuse Sister     Arthritis Mother    Stroke Mother    Hypertension Mother    Arthritis Father    Heart disease Father    Hyperlipidemia Brother    Hyperlipidemia Sister    Sudden death Brother    Mental illness Sister    Diabetes Sister    Cancer Daughter        breast    BP (!) 150/64 (BP Location: Right Arm, Patient Position: Sitting, Cuff Size: Large)   Pulse 70   Ht 5\' 4"  (1.626 m)   Wt 183 lb 12.8 oz (83.4 kg)   SpO2 97%   BMI 31.55 kg/m    Review of Systems     Objective:   Physical Exam VITAL SIGNS:  See vs page GENERAL: no distress NECK: 2 cm right thyroid nodule is palpable.   Lab Results  Component Value Date   TSH 1.48 09/29/2020      Assessment & Plan:  Hypothyroidism: well-controlled.  Please continue the same synthroid.   Thyroid nodule: clinically stable.  We'll recheck next year

## 2020-11-06 ENCOUNTER — Other Ambulatory Visit (HOSPITAL_BASED_OUTPATIENT_CLINIC_OR_DEPARTMENT_OTHER): Payer: Self-pay

## 2020-11-06 MED ORDER — COVID-19MRNA BIVAL VACC PFIZER 30 MCG/0.3ML IM SUSP
INTRAMUSCULAR | 0 refills | Status: DC
  Filled 2020-11-06: qty 0.3, 1d supply, fill #0

## 2020-11-09 ENCOUNTER — Ambulatory Visit (HOSPITAL_BASED_OUTPATIENT_CLINIC_OR_DEPARTMENT_OTHER): Payer: Medicare Other | Admitting: Cardiovascular Disease

## 2020-11-10 ENCOUNTER — Other Ambulatory Visit: Payer: Self-pay | Admitting: Family Medicine

## 2020-11-11 ENCOUNTER — Other Ambulatory Visit: Payer: Self-pay

## 2020-11-11 ENCOUNTER — Other Ambulatory Visit (INDEPENDENT_AMBULATORY_CARE_PROVIDER_SITE_OTHER): Payer: Medicare Other

## 2020-11-11 ENCOUNTER — Ambulatory Visit: Payer: Medicare Other | Admitting: Family Medicine

## 2020-11-11 DIAGNOSIS — Z0000 Encounter for general adult medical examination without abnormal findings: Secondary | ICD-10-CM

## 2020-11-11 DIAGNOSIS — Z01818 Encounter for other preprocedural examination: Secondary | ICD-10-CM

## 2020-11-12 LAB — CBC WITH DIFFERENTIAL
Basophils Absolute: 0.1 10*3/uL (ref 0.0–0.2)
Basos: 1 %
EOS (ABSOLUTE): 0.1 10*3/uL (ref 0.0–0.4)
Eos: 1 %
Hematocrit: 34.1 % (ref 34.0–46.6)
Hemoglobin: 11.5 g/dL (ref 11.1–15.9)
Immature Grans (Abs): 0 10*3/uL (ref 0.0–0.1)
Immature Granulocytes: 0 %
Lymphocytes Absolute: 2.2 10*3/uL (ref 0.7–3.1)
Lymphs: 34 %
MCH: 30.5 pg (ref 26.6–33.0)
MCHC: 33.7 g/dL (ref 31.5–35.7)
MCV: 91 fL (ref 79–97)
Monocytes Absolute: 0.3 10*3/uL (ref 0.1–0.9)
Monocytes: 5 %
Neutrophils Absolute: 3.8 10*3/uL (ref 1.4–7.0)
Neutrophils: 59 %
RBC: 3.77 x10E6/uL (ref 3.77–5.28)
RDW: 13.7 % (ref 11.7–15.4)
WBC: 6.6 10*3/uL (ref 3.4–10.8)

## 2020-11-12 LAB — HEMOGLOBIN A1C: Hgb A1c MFr Bld: 6 % (ref 4.6–6.5)

## 2020-11-12 LAB — BASIC METABOLIC PANEL
BUN: 18 mg/dL (ref 6–23)
CO2: 24 mEq/L (ref 19–32)
Calcium: 9.9 mg/dL (ref 8.4–10.5)
Chloride: 106 mEq/L (ref 96–112)
Creatinine, Ser: 1.39 mg/dL — ABNORMAL HIGH (ref 0.40–1.20)
GFR: 33.88 mL/min — ABNORMAL LOW (ref >60.00)
Glucose, Bld: 112 mg/dL — ABNORMAL HIGH (ref 70–99)
Potassium: 4 mEq/L (ref 3.5–5.1)
Sodium: 139 mEq/L (ref 135–145)

## 2020-11-12 LAB — PROTIME-INR
INR: 1 ratio (ref 0.8–1.0)
Prothrombin Time: 11.1 s (ref 9.6–13.1)

## 2020-11-14 ENCOUNTER — Encounter: Payer: Self-pay | Admitting: Family Medicine

## 2020-11-16 DIAGNOSIS — M1711 Unilateral primary osteoarthritis, right knee: Secondary | ICD-10-CM | POA: Diagnosis not present

## 2020-11-16 DIAGNOSIS — M25661 Stiffness of right knee, not elsewhere classified: Secondary | ICD-10-CM | POA: Diagnosis not present

## 2020-11-18 ENCOUNTER — Other Ambulatory Visit: Payer: Self-pay | Admitting: Orthopedic Surgery

## 2020-11-18 DIAGNOSIS — Z01818 Encounter for other preprocedural examination: Secondary | ICD-10-CM

## 2020-11-18 DIAGNOSIS — M1711 Unilateral primary osteoarthritis, right knee: Secondary | ICD-10-CM | POA: Diagnosis not present

## 2020-11-18 NOTE — Progress Notes (Signed)
Please enter orders for surgery scheduled for 11-23-20.

## 2020-11-19 ENCOUNTER — Encounter (HOSPITAL_COMMUNITY): Payer: Self-pay

## 2020-11-19 DIAGNOSIS — M1711 Unilateral primary osteoarthritis, right knee: Secondary | ICD-10-CM | POA: Diagnosis present

## 2020-11-19 NOTE — Patient Instructions (Addendum)
DUE TO COVID-19 ONLY ONE VISITOR IS ALLOWED TO COME WITH YOU AND STAY IN THE WAITING ROOM ONLY DURING PRE OP AND PROCEDURE.   **NO VISITORS ARE ALLOWED IN THE SHORT STAY AREA OR RECOVERY ROOM!!**   Your procedure is scheduled on: Monday, Oct. 24, 2022   Report to [facility] Main Entrance    Report to admitting at 11:25 AM   Call this number if you have problems the morning of surgery [phone]   Do not eat food :After Midnight.   May have liquids until  11:00 AM  day of surgery  CLEAR LIQUID DIET  Foods Allowed                                                                     Foods Excluded  Water, Black Coffee and tea, regular and decaf                             liquids that you cannot  Plain Jell-O in any flavor  (No red)                                           see through such as: Fruit ices (not with fruit pulp)                                     milk, soups, orange juice              Iced Popsicles (No red)                                    All solid food                                   Apple juices Sports drinks like Gatorade (No red) Lightly seasoned clear broth or consume(fat free) Sugar, honey syrup  Sample Menu Breakfast                                Lunch                                     Supper Cranberry juice                    Beef broth                            Chicken broth Jell-O                                     Grape juice  Apple juice Coffee or tea                        Jell-O                                      Popsicle                                                Coffee or tea                        Coffee or tea         The day of surgery:  Drink ONE (1) Pre-Surgery Clear Ensure at 11:00 AM the morning of surgery. Drink in one sitting. Do not sip.  This drink was given to you during your hospital  pre-op appointment visit. Nothing else to drink after completing the  Pre-Surgery Clear Ensure.           If you have questions, please contact your surgeon's office.     Oral Hygiene is also important to reduce your risk of infection.                                    Remember - BRUSH YOUR TEETH THE MORNING OF SURGERY WITH YOUR REGULAR TOOTHPASTE   Do NOT smoke after Midnight   Take these medicines the morning of surgery with A SIP OF WATER: Famotidine (Pepcid), Fluoxetine (Prozac), Levothyroxine (Synthroid), Pantoprazole (Protonix), Pravastatin (Pravachol)                              You may not have any metal on your body including hair pins, jewelry, and body piercing             Do not wear make-up, lotions, powders, perfumes/cologne, or deodorant  Do not wear nail polish including gel and S&S, artificial/acrylic nails, or any other type of covering on natural nails including finger and toenails. If you have artificial nails, gel coating, etc. that needs to be removed by a nail salon please have this removed prior to surgery or surgery may need to be canceled/ delayed if the surgeon/ anesthesia feels like they are unable to be safely monitored.   Do not shave  48 hours prior to surgery.    Do not bring valuables to the hospital. [facility] IS NOT             RESPONSIBLE   FOR VALUABLES.   Contacts, dentures or bridgework may not be worn into surgery.   Patients discharged on the day of surgery will not be allowed to drive home.   Special Instructions: Bring a copy of your healthcare power of attorney and living will documents         the day of surgery if you haven't scanned them before.              Please read over the following fact sheets you were given: IF YOU HAVE QUESTIONS ABOUT YOUR PRE-OP INSTRUCTIONS PLEASE CALL [phone]    [facility] - Preparing for Surgery Before surgery,  you can play an important role.  Because skin is not sterile, your skin needs to be as free of germs as possible.  You can reduce the number of germs on your skin by washing with CHG  (chlorahexidine gluconate) soap before surgery.  CHG is an antiseptic cleaner which kills germs and bonds with the skin to continue killing germs even after washing. Please DO NOT use if you have an allergy to CHG or antibacterial soaps.  If your skin becomes reddened/irritated stop using the CHG and inform your nurse when you arrive at Short Stay. Do not shave (including legs and underarms) for at least 48 hours prior to the first CHG shower.  You may shave your face/neck.  Please follow these instructions carefully:  1.  Shower with CHG Soap the night before surgery and the  morning of surgery.  2.  If you choose to wash your hair, wash your hair first as usual with your normal  shampoo.  3.  After you shampoo, rinse your hair and body thoroughly to remove the shampoo.                             4.  Use CHG as you would any other liquid soap.  You can apply chg directly to the skin and wash.  Gently with a scrungie or clean washcloth.  5.  Apply the CHG Soap to your body ONLY FROM THE NECK DOWN.   Do   not use on face/ open                           Wound or open sores. Avoid contact with eyes, ears mouth and   genitals (private parts).                       Wash face,  Genitals (private parts) with your normal soap.             6.  Wash thoroughly, paying special attention to the area where your    surgery  will be performed.  7.  Thoroughly rinse your body with warm water from the neck down.  8.  DO NOT shower/wash with your normal soap after using and rinsing off the CHG Soap.                9.  Pat yourself dry with a clean towel.            10.  Wear clean pajamas.            11.  Place clean sheets on your bed the night of your first shower and do not  sleep with pets. Day of Surgery : Do not apply any lotions/deodorants the morning of surgery.  Please wear clean clothes to the hospital/surgery center.  FAILURE TO FOLLOW THESE INSTRUCTIONS MAY RESULT IN THE CANCELLATION OF YOUR  SURGERY  PATIENT SIGNATURE_________________________________  NURSE SIGNATURE__________________________________  ________________________________________________________________________     Rogelia Mire  An incentive spirometer is a tool that can help keep your lungs clear and active. This tool measures how well you are filling your lungs with each breath. Taking long deep breaths may help reverse or decrease the chance of developing breathing (pulmonary) problems (especially infection) following: A long period of time when you are unable to move or be active. BEFORE THE PROCEDURE  If the spirometer includes an indicator to  show your best effort, your nurse or respiratory therapist will set it to a desired goal. If possible, sit up straight or lean slightly forward. Try not to slouch. Hold the incentive spirometer in an upright position. INSTRUCTIONS FOR USE  Sit on the edge of your bed if possible, or sit up as far as you can in bed or on a chair. Hold the incentive spirometer in an upright position. Breathe out normally. Place the mouthpiece in your mouth and seal your lips tightly around it. Breathe in slowly and as deeply as possible, raising the piston or the ball toward the top of the column. Hold your breath for 3-5 seconds or for as long as possible. Allow the piston or ball to fall to the bottom of the column. Remove the mouthpiece from your mouth and breathe out normally. Rest for a few seconds and repeat Steps 1 through 7 at least 10 times every 1-2 hours when you are awake. Take your time and take a few normal breaths between deep breaths. The spirometer may include an indicator to show your best effort. Use the indicator as a goal to work toward during each repetition. After each set of 10 deep breaths, practice coughing to be sure your lungs are clear. If you have an incision (the cut made at the time of surgery), support your incision when coughing by placing a  pillow or rolled up towels firmly against it. Once you are able to get out of bed, walk around indoors and cough well. You may stop using the incentive spirometer when instructed by your caregiver.  RISKS AND COMPLICATIONS Take your time so you do not get dizzy or light-headed. If you are in pain, you may need to take or ask for pain medication before doing incentive spirometry. It is harder to take a deep breath if you are having pain. AFTER USE Rest and breathe slowly and easily. It can be helpful to keep track of a log of your progress. Your caregiver can provide you with a simple table to help with this. If you are using the spirometer at home, follow these instructions: SEEK MEDICAL CARE IF:  You are having difficultly using the spirometer. You have trouble using the spirometer as often as instructed. Your pain medication is not giving enough relief while using the spirometer. You develop fever of 100.5 F (38.1 C) or higher. SEEK IMMEDIATE MEDICAL CARE IF:  You cough up bloody sputum that had not been present before. You develop fever of 102 F (38.9 C) or greater. You develop worsening pain at or near the incision site. MAKE SURE YOU:  Understand these instructions. Will watch your condition. Will get help right away if you are not doing well or get worse. Document Released: 05/30/2006 Document Revised: 04/11/2011 Document Reviewed: 07/31/2006 [employer] Patient Information 2014 [employer], [place].   ________________________________________________________________________    WHAT IS A BLOOD TRANSFUSION? Blood Transfusion Information  A transfusion is the replacement of blood or some of its parts. Blood is made up of multiple cells which provide different functions. Red blood cells carry oxygen and are used for blood loss replacement. White blood cells fight against infection. Platelets control bleeding. Plasma helps clot blood. Other blood products are available for  specialized needs, such as hemophilia or other clotting disorders. BEFORE THE TRANSFUSION  Who gives blood for transfusions?  Healthy volunteers who are fully evaluated to make sure their blood is safe. This is blood bank blood. Transfusion therapy is the safest it has  ever been in the practice of medicine. Before blood is taken from a donor, a complete history is taken to make sure that person has no history of diseases nor engages in risky social behavior (examples are intravenous drug use or sexual activity with multiple partners). The donor's travel history is screened to minimize risk of transmitting infections, such as malaria. The donated blood is tested for signs of infectious diseases, such as HIV and hepatitis. The blood is then tested to be sure it is compatible with you in order to minimize the chance of a transfusion reaction. If you or a relative donates blood, this is often done in anticipation of surgery and is not appropriate for emergency situations. It takes many days to process the donated blood. RISKS AND COMPLICATIONS Although transfusion therapy is very safe and saves many lives, the main dangers of transfusion include:  Getting an infectious disease. Developing a transfusion reaction. This is an allergic reaction to something in the blood you were given. Every precaution is taken to prevent this. The decision to have a blood transfusion has been considered carefully by your caregiver before blood is given. Blood is not given unless the benefits outweigh the risks. AFTER THE TRANSFUSION Right after receiving a blood transfusion, you will usually feel much better and more energetic. This is especially true if your red blood cells have gotten low (anemic). The transfusion raises the level of the red blood cells which carry oxygen, and this usually causes an energy increase. The nurse administering the transfusion will monitor you carefully for complications. HOME CARE INSTRUCTIONS   No special instructions are needed after a transfusion. You may find your energy is better. Speak with your caregiver about any limitations on activity for underlying diseases you may have. SEEK MEDICAL CARE IF:  Your condition is not improving after your transfusion. You develop redness or irritation at the intravenous (IV) site. SEEK IMMEDIATE MEDICAL CARE IF:  Any of the following symptoms occur over the next 12 hours: Shaking chills. You have a temperature by mouth above 102 F (38.9 C), not controlled by medicine. Chest, back, or muscle pain. People around you feel you are not acting correctly or are confused. Shortness of breath or difficulty breathing. Dizziness and fainting. You get a rash or develop hives. You have a decrease in urine output. Your urine turns a dark color or changes to pink, red, or brown. Any of the following symptoms occur over the next 10 days: You have a temperature by mouth above 102 F (38.9 C), not controlled by medicine. Shortness of breath. Weakness after normal activity. The white part of the eye turns yellow (jaundice). You have a decrease in the amount of urine or are urinating less often. Your urine turns a dark color or changes to pink, red, or brown. Document Released: 01/15/2000 Document Revised: 04/11/2011 Document Reviewed: 09/03/2007 [facility] Patient Information 2014 [place], [place].  _______________________________________________________________________

## 2020-11-19 NOTE — H&P (Signed)
TOTAL KNEE ADMISSION H&P  Patient is being admitted for right total knee arthroplasty.  Subjective:  Chief Complaint:right knee pain.  HPI: Tonya Johns, 87 y.o. female, has a history of pain and functional disability in the right knee due to arthritis and has failed non-surgical conservative treatments for greater than 12 weeks to includeNSAID's and/or analgesics, corticosteriod injections, use of assistive devices, weight reduction as appropriate, and activity modification.  Onset of symptoms was gradual, starting  several  years ago with gradually worsening course since that time. The patient noted no past surgery on the right knee(s).  Patient currently rates pain in the right knee(s) at 10 out of 10 with activity. Patient has night pain, worsening of pain with activity and weight bearing, pain that interferes with activities of daily living, pain with passive range of motion, crepitus, and joint swelling.  Patient has evidence of periarticular osteophytes and joint space narrowing by imaging studies.  There is no active infection.  Patient Active Problem List   Diagnosis Date Noted   Osteoarthritis of right knee 11/19/2020   Incomplete left bundle branch block (LBBB) 10/08/2020   Nonrheumatic aortic valve insufficiency 10/08/2020   Obesity (BMI 30-39.9) 01/07/2019   Abnormal EKG 03/23/2018   Dizziness 03/23/2018   Gait instability 03/23/2018   Bradycardia 03/23/2018   Primary osteoarthritis of left knee 01/09/2017   Degenerative arthritis of left knee 01/05/2017   Elevated blood pressure reading in office without diagnosis of hypertension 03/09/2015   Lumbar back pain 05/22/2014   Uterine prolapse 02/19/2014   Hypothyroidism following radioiodine therapy 07/26/2011   Elevated glucose 04/07/2011   Multinodular goiter 12/10/2010   Knee pain, bilateral 11/25/2010   Depression 11/25/2010   Heart murmur 11/25/2010   Physical exam 07/14/2010   GERD (gastroesophageal reflux disease)  06/15/2010   Hyperlipidemia 06/15/2010   Past Medical History:  Diagnosis Date   Anemia    Arthritis    Diverticulitis    GERD (gastroesophageal reflux disease)    Hyperlipidemia    Hypothyroidism    Leg swelling    Osteoporosis     Past Surgical History:  Procedure Laterality Date   EYE SURGERY Bilateral    cataract removal   NO PAST SURGERIES     TOTAL KNEE ARTHROPLASTY Left 01/09/2017   Procedure: TOTAL KNEE ARTHROPLASTY;  Surgeon: Gean Birchwood, MD;  Location: [facility] OR;  Service: Orthopedics;  Laterality: Left;    No current facility-administered medications for this encounter.   Current Outpatient Medications  Medication Sig Dispense Refill Last Dose   acetaminophen (TYLENOL) 500 MG tablet Take 1,000 mg by mouth every 8 (eight) hours as needed for moderate pain.      Camphor-Menthol-Methyl Sal (SALONPAS) 3.02-06-08 % PTCH Apply 1 patch topically daily as needed (pain).      Coenzyme Q10 (CO Q10) 100 MG CAPS Take 100 mg by mouth at bedtime.      famotidine (PEPCID) 20 MG tablet Take 20 mg by mouth daily.      FLUoxetine (PROZAC) 10 MG capsule TAKE 1 CAPSULE BY MOUTH  DAILY 90 capsule 3    levothyroxine (SYNTHROID) 100 MCG tablet TAKE 1 TABLET BY MOUTH  DAILY 90 tablet 3    Menthol, Topical Analgesic, (BIOFREEZE ROLL-ON) 4 % GEL Apply 1 application topically daily as needed (pain).      Menthol, Topical Analgesic, (BLUE-EMU MAXIMUM STRENGTH) 2.5 % LIQD Apply 1 application topically daily as needed (pain).      Multiple Vitamin (MULTIVITAMIN WITH MINERALS) TABS tablet Take  1 tablet by mouth at bedtime.      pantoprazole (PROTONIX) 40 MG tablet Take 1 tablet (40 mg total) by mouth daily. 30 tablet 3    pravastatin (PRAVACHOL) 80 MG tablet TAKE 1 TABLET BY MOUTH  DAILY 90 tablet 3    sodium chloride (OCEAN) 0.65 % SOLN nasal spray Place 2 sprays into both nostrils as needed for congestion.      COVID-19 mRNA bivalent vaccine, Pfizer, injection Inject into the muscle. 0.3 mL 0     Allergies  Allergen Reactions   Penicillins Rash    Did it involve swelling of the face/tongue/throat, SOB, or low BP? No Did it involve sudden or severe rash/hives, skin peeling, or any reaction on the inside of your mouth or nose? Yes Did you need to seek medical attention at a hospital or doctor's office? No When did it last happen?      approx 2005 If all above answers are "NO", may proceed with cephalosporin use.     Social History   Tobacco Use   Smoking status: Never   Smokeless tobacco: Never  Substance Use Topics   Alcohol use: No    Family History  Problem Relation Age of Onset   Alcohol abuse Brother    Alcohol abuse Sister    Arthritis Mother    Stroke Mother    Hypertension Mother    Arthritis Father    Heart disease Father    Hyperlipidemia Brother    Hyperlipidemia Sister    Sudden death Brother    Mental illness Sister    Diabetes Sister    Cancer Daughter        breast     Review of Systems  Constitutional: Negative.   HENT:  Positive for tinnitus.   Eyes: Negative.   Respiratory: Negative.    Cardiovascular: Negative.   Gastrointestinal: Negative.   Endocrine: Negative.   Genitourinary: Negative.   Musculoskeletal:  Positive for arthralgias.  Skin: Negative.   Allergic/Immunologic: Negative.   Neurological: Negative.   Hematological: Negative.   Psychiatric/Behavioral:  Positive for sleep disturbance.    Objective:  Physical Exam Constitutional:      Appearance: Normal appearance. She is normal weight.  HENT:     Head: Normocephalic and atraumatic.     Nose: Nose normal.  Eyes:     Pupils: Pupils are equal, round, and reactive to light.  Cardiovascular:     Pulses: Normal pulses.  Pulmonary:     Effort: Pulmonary effort is normal.  Musculoskeletal:        General: Tenderness present.     Cervical back: Normal range of motion and neck supple.     Comments: she has a range from 10 to 95 on the right.    Skin:    General:  Skin is warm and dry.  Neurological:     General: No focal deficit present.     Mental Status: She is alert and oriented to person, place, and time. Mental status is at baseline.  Psychiatric:        Mood and Affect: Mood normal.        Behavior: Behavior normal.        Thought Content: Thought content normal.        Judgment: Judgment normal.    Vital signs in last 24 hours:    Labs:   Estimated body mass index is 31.55 kg/m as calculated from the following:   Height as of 11/04/20: 5\' 4"  (1.626  m).   Weight as of 11/04/20: 83.4 kg.   Imaging Review Plain radiographs demonstrate   On the right side the patient has had arthritis but medially lateral subluxation of tibia centimeter with peripheral osteophytes.      Assessment/Plan:  End stage arthritis, right knee   The patient history, physical examination, clinical judgment of the provider and imaging studies are consistent with end stage degenerative joint disease of the right knee(s) and total knee arthroplasty is deemed medically necessary. The treatment options including medical management, injection therapy arthroscopy and arthroplasty were discussed at length. The risks and benefits of total knee arthroplasty were presented and reviewed. The risks due to aseptic loosening, infection, stiffness, patella tracking problems, thromboembolic complications and other imponderables were discussed. The patient acknowledged the explanation, agreed to proceed with the plan and consent was signed. Patient is being admitted for inpatient treatment for surgery, pain control, PT, OT, prophylactic antibiotics, VTE prophylaxis, progressive ambulation and ADL's and discharge planning. The patient is planning to be discharged home with home health services     Patient's anticipated LOS is less than 2 midnights, meeting these requirements: - Younger than 27 - Lives within 1 hour of care - Has a competent adult at home to recover with  post-op recover - NO history of  - Chronic pain requiring opiods  - Diabetes  - Coronary Artery Disease  - Heart failure  - Heart attack  - Stroke  - DVT/VTE  - Cardiac arrhythmia  - Respiratory Failure/COPD  - Renal failure  - Anemia  - Advanced Liver disease

## 2020-11-20 ENCOUNTER — Encounter (HOSPITAL_COMMUNITY): Payer: Self-pay

## 2020-11-20 ENCOUNTER — Encounter (HOSPITAL_COMMUNITY)
Admission: RE | Admit: 2020-11-20 | Discharge: 2020-11-20 | Disposition: A | Discharge status: Home / Self Care | Payer: Medicare Other | Source: Ambulatory Visit | Attending: Orthopedic Surgery | Admitting: Orthopedic Surgery

## 2020-11-20 ENCOUNTER — Other Ambulatory Visit: Payer: Self-pay

## 2020-11-20 ENCOUNTER — Encounter: Payer: Self-pay | Admitting: Family Medicine

## 2020-11-20 ENCOUNTER — Ambulatory Visit (HOSPITAL_COMMUNITY)
Admission: RE | Admit: 2020-11-20 | Discharge: 2020-11-20 | Disposition: A | Discharge status: Home / Self Care | Payer: Medicare Other | Source: Ambulatory Visit | Attending: Orthopedic Surgery | Admitting: Orthopedic Surgery

## 2020-11-20 VITALS — BP 159/62 | HR 70 | Temp 98.3°F | Resp 16 | Ht 64.0 in | Wt 182.0 lb

## 2020-11-20 DIAGNOSIS — Z01818 Encounter for other preprocedural examination: Secondary | ICD-10-CM

## 2020-11-20 HISTORY — DX: Nonrheumatic aortic (valve) insufficiency: I35.1

## 2020-11-20 LAB — CBC WITH DIFFERENTIAL/PLATELET
Abs Immature Granulocytes: 0.02 10*3/uL (ref 0.00–0.07)
Basophils Absolute: 0 10*3/uL (ref 0.0–0.1)
Basophils Relative: 1 %
Eosinophils Absolute: 0.1 10*3/uL (ref 0.0–0.5)
Eosinophils Relative: 2 %
HCT: 34.5 % — ABNORMAL LOW (ref 36.0–46.0)
Hemoglobin: 11.1 g/dL — ABNORMAL LOW (ref 12.0–15.0)
Immature Granulocytes: 0 %
Lymphocytes Relative: 38 %
Lymphs Abs: 2.2 10*3/uL (ref 0.7–4.0)
MCH: 30.3 pg (ref 26.0–34.0)
MCHC: 32.2 g/dL (ref 30.0–36.0)
MCV: 94.3 fL (ref 80.0–100.0)
Monocytes Absolute: 0.4 10*3/uL (ref 0.1–1.0)
Monocytes Relative: 6 %
Neutro Abs: 3.1 10*3/uL (ref 1.7–7.7)
Neutrophils Relative %: 53 %
Platelets: 218 10*3/uL (ref 150–400)
RBC: 3.66 MIL/uL — ABNORMAL LOW (ref 3.87–5.11)
RDW: 13.8 % (ref 11.5–15.5)
WBC: 5.8 10*3/uL (ref 4.0–10.5)
nRBC: 0 % (ref 0.0–0.2)

## 2020-11-20 LAB — SURGICAL PCR SCREEN
MRSA, PCR: NEGATIVE
Staphylococcus aureus: NEGATIVE

## 2020-11-20 LAB — BASIC METABOLIC PANEL
Anion gap: 6 (ref 5–15)
BUN: 18 mg/dL (ref 8–23)
CO2: 26 mmol/L (ref 22–32)
Calcium: 9.2 mg/dL (ref 8.9–10.3)
Chloride: 107 mmol/L (ref 98–111)
Creatinine, Ser: 1.39 mg/dL — ABNORMAL HIGH (ref 0.44–1.00)
GFR, Estimated: 36 mL/min — ABNORMAL LOW (ref >60)
Glucose, Bld: 88 mg/dL (ref 70–99)
Potassium: 3.9 mmol/L (ref 3.5–5.1)
Sodium: 139 mmol/L (ref 135–145)

## 2020-11-20 LAB — APTT: aPTT: 32 seconds (ref 24–36)

## 2020-11-20 LAB — PROTIME-INR
INR: 1 (ref 0.8–1.2)
Prothrombin Time: 13.5 seconds (ref 11.4–15.2)

## 2020-11-20 NOTE — Care Plan (Signed)
Ortho Bundle Case Management Note  Patient Details  Name: Tonya Johns MRN: [id number] Date of Birth: 02-Aug-1930      Met with patient and her daughter in the office prior to surgery. She will discharge to home with family to assist. May need an overnight stay prior to discharge. She has equipment at home. HHPT and bath aide referral to [facility]. OPPT set up with [place]. Patient and MD in agreement with plan. Choice offered.               DME Arranged:    DME Agency:     HH Arranged:  PT, Nurse's Aide [place] Agency:  [place]  Additional Comments: Please contact me with any questions of if this plan should need to change.  Ladell Heads,  [place] Specialist  [phone] 11/20/2020, 4:00 PM

## 2020-11-20 NOTE — Progress Notes (Addendum)
PCP - Janeece Agee , NP  ( Neena Rhymes, MD lov  with Gerlene Burdock 09-29-20 epic Cardiologist - Jodelle Red LOV 10-08-20 epic  Clearance  PPM/ICD -  Device Orders -  Rep Notified -   Chest x-ray -  EKG - 10/08/20 epic Stress Test -  ECHO - 03-24-2018 epic Cardiac Cath -   Sleep Study -  CPAP -   Fasting Blood Sugar -  Checks Blood Sugar _____ times a day  Blood Thinner Instructions: Aspirin Instructions:  ERAS Protcol - PRE-SURGERY Ensure or G2-   COVID TEST-N/A   Pfizer fully vaccinated+ 3 boosters COVID vaccine -  Activity--Able to walk in home with Cane without SOB Anesthesia review:   Patient denies shortness of breath, fever, cough and chest pain at PAT appointment   All instructions explained to the patient, with a verbal understanding of the material. Patient agrees to go over the instructions while at home for a better understanding. Patient also instructed to self quarantine after being tested for COVID-19. The opportunity to ask questions was provided.

## 2020-11-20 NOTE — Progress Notes (Signed)
Pt. Unable to void at preop

## 2020-11-22 NOTE — Telephone Encounter (Signed)
Forms filled out and returned  Thank you  Luan Pulling

## 2020-11-23 ENCOUNTER — Other Ambulatory Visit: Payer: Self-pay

## 2020-11-23 ENCOUNTER — Encounter (HOSPITAL_COMMUNITY): Payer: Self-pay | Admitting: Orthopedic Surgery

## 2020-11-23 ENCOUNTER — Inpatient Hospital Stay (HOSPITAL_COMMUNITY): Payer: Medicare Other | Admitting: Anesthesiology

## 2020-11-23 ENCOUNTER — Encounter (HOSPITAL_COMMUNITY)
Admission: RE | Disposition: A | Discharge status: Home Health | Payer: Self-pay | Source: Home / Self Care | Attending: Orthopedic Surgery

## 2020-11-23 ENCOUNTER — Inpatient Hospital Stay (HOSPITAL_COMMUNITY)
Admission: RE | Admit: 2020-11-23 | Discharge: 2020-11-30 | DRG: 470 | Disposition: A | Discharge status: Home Health | Payer: Medicare Other | Attending: Orthopedic Surgery | Admitting: Orthopedic Surgery

## 2020-11-23 DIAGNOSIS — Z01818 Encounter for other preprocedural examination: Secondary | ICD-10-CM

## 2020-11-23 DIAGNOSIS — E785 Hyperlipidemia, unspecified: Secondary | ICD-10-CM | POA: Diagnosis present

## 2020-11-23 DIAGNOSIS — E890 Postprocedural hypothyroidism: Secondary | ICD-10-CM | POA: Diagnosis not present

## 2020-11-23 DIAGNOSIS — Z79890 Hormone replacement therapy (postmenopausal): Secondary | ICD-10-CM | POA: Diagnosis not present

## 2020-11-23 DIAGNOSIS — M810 Age-related osteoporosis without current pathological fracture: Secondary | ICD-10-CM | POA: Diagnosis present

## 2020-11-23 DIAGNOSIS — Z79899 Other long term (current) drug therapy: Secondary | ICD-10-CM

## 2020-11-23 DIAGNOSIS — D62 Acute posthemorrhagic anemia: Secondary | ICD-10-CM | POA: Diagnosis not present

## 2020-11-23 DIAGNOSIS — M1711 Unilateral primary osteoarthritis, right knee: Principal | ICD-10-CM | POA: Diagnosis present

## 2020-11-23 DIAGNOSIS — Z20822 Contact with and (suspected) exposure to covid-19: Secondary | ICD-10-CM | POA: Diagnosis present

## 2020-11-23 DIAGNOSIS — G8918 Other acute postprocedural pain: Secondary | ICD-10-CM | POA: Diagnosis not present

## 2020-11-23 DIAGNOSIS — E669 Obesity, unspecified: Secondary | ICD-10-CM | POA: Diagnosis present

## 2020-11-23 DIAGNOSIS — Z8249 Family history of ischemic heart disease and other diseases of the circulatory system: Secondary | ICD-10-CM

## 2020-11-23 DIAGNOSIS — Z6831 Body mass index (BMI) 31.0-31.9, adult: Secondary | ICD-10-CM | POA: Diagnosis not present

## 2020-11-23 DIAGNOSIS — Z833 Family history of diabetes mellitus: Secondary | ICD-10-CM

## 2020-11-23 DIAGNOSIS — Z96651 Presence of right artificial knee joint: Secondary | ICD-10-CM | POA: Diagnosis not present

## 2020-11-23 DIAGNOSIS — Z96652 Presence of left artificial knee joint: Secondary | ICD-10-CM | POA: Diagnosis not present

## 2020-11-23 DIAGNOSIS — I447 Left bundle-branch block, unspecified: Secondary | ICD-10-CM | POA: Diagnosis not present

## 2020-11-23 DIAGNOSIS — K219 Gastro-esophageal reflux disease without esophagitis: Secondary | ICD-10-CM | POA: Diagnosis not present

## 2020-11-23 HISTORY — PX: TOTAL KNEE ARTHROPLASTY: SHX125

## 2020-11-23 LAB — URINALYSIS, ROUTINE W REFLEX MICROSCOPIC
Bilirubin Urine: NEGATIVE
Glucose, UA: NEGATIVE mg/dL
Hgb urine dipstick: NEGATIVE
Ketones, ur: NEGATIVE mg/dL
Nitrite: NEGATIVE
Protein, ur: NEGATIVE mg/dL
Specific Gravity, Urine: 1.017 (ref 1.005–1.030)
pH: 5 (ref 5.0–8.0)

## 2020-11-23 LAB — SARS CORONAVIRUS 2 BY RT PCR (HOSPITAL ORDER, PERFORMED IN ~~LOC~~ HOSPITAL LAB): SARS Coronavirus 2: NEGATIVE

## 2020-11-23 SURGERY — ARTHROPLASTY, KNEE, TOTAL
Anesthesia: Spinal | Site: Knee | Laterality: Right

## 2020-11-23 MED ORDER — MORPHINE SULFATE (PF) 4 MG/ML IV SOLN: 1.0000 mg | INTRAVENOUS | Status: DC | PRN

## 2020-11-23 MED ORDER — FLEET ENEMA 7-19 GM/118ML RE ENEM
1.0000 | ENEMA | Freq: Once | RECTAL | Status: DC | PRN
Start: 2020-11-23 — End: 2020-11-30

## 2020-11-23 MED ORDER — DEXAMETHASONE SODIUM PHOSPHATE 10 MG/ML IJ SOLN
INTRAMUSCULAR | Status: AC
  Filled 2020-11-23: qty 1

## 2020-11-23 MED ORDER — DOCUSATE SODIUM 100 MG PO CAPS
100.0000 mg | ORAL_CAPSULE | Freq: Two times a day (BID) | ORAL | Status: DC
  Administered 2020-11-23 – 2020-11-30 (×12): 100 mg via ORAL
  Filled 2020-11-23 (×15): qty 1

## 2020-11-23 MED ORDER — ONDANSETRON HCL 4 MG/2ML IJ SOLN
INTRAMUSCULAR | Status: AC
  Filled 2020-11-23: qty 2

## 2020-11-23 MED ORDER — BUPIVACAINE IN DEXTROSE 0.75-8.25 % IT SOLN
INTRATHECAL | Status: DC | PRN
  Administered 2020-11-23: 1.6 mL via INTRATHECAL

## 2020-11-23 MED ORDER — OXYCODONE HCL 5 MG/5ML PO SOLN: 5.0000 mg | Freq: Once | ORAL | Status: DC | PRN

## 2020-11-23 MED ORDER — LIDOCAINE HCL (PF) 2 % IJ SOLN
INTRAMUSCULAR | Status: AC
  Filled 2020-11-23: qty 5

## 2020-11-23 MED ORDER — BISACODYL 5 MG PO TBEC: 5.0000 mg | DELAYED_RELEASE_TABLET | Freq: Every day | ORAL | Status: DC | PRN

## 2020-11-23 MED ORDER — PROPOFOL 1000 MG/100ML IV EMUL
INTRAVENOUS | Status: AC
  Filled 2020-11-23: qty 100

## 2020-11-23 MED ORDER — PHENYLEPHRINE HCL-NACL 20-0.9 MG/250ML-% IV SOLN
INTRAVENOUS | Status: DC | PRN
  Administered 2020-11-23: 20 ug/min via INTRAVENOUS

## 2020-11-23 MED ORDER — PROPOFOL 10 MG/ML IV BOLUS
INTRAVENOUS | Status: AC
  Filled 2020-11-23: qty 20

## 2020-11-23 MED ORDER — WATER FOR IRRIGATION, STERILE IR SOLN
Status: DC | PRN
  Administered 2020-11-23: 2000 mL

## 2020-11-23 MED ORDER — POVIDONE-IODINE 10 % EX SWAB
2.0000 "application " | Freq: Once | CUTANEOUS | Status: AC
  Administered 2020-11-23: 2 via TOPICAL

## 2020-11-23 MED ORDER — DEXAMETHASONE SODIUM PHOSPHATE 10 MG/ML IJ SOLN
INTRAMUSCULAR | Status: DC | PRN
  Administered 2020-11-23: 10 mg via INTRAVENOUS

## 2020-11-23 MED ORDER — LACTATED RINGERS IV SOLN
INTRAVENOUS | Status: DC
Start: 2020-11-23 — End: 2020-11-23

## 2020-11-23 MED ORDER — ACETAMINOPHEN 500 MG PO TABS
1000.0000 mg | ORAL_TABLET | Freq: Four times a day (QID) | ORAL | Status: AC
  Administered 2020-11-23 – 2020-11-24 (×4): 1000 mg via ORAL
  Filled 2020-11-23 (×4): qty 2

## 2020-11-23 MED ORDER — ASPIRIN EC 81 MG PO TBEC: 81.0000 mg | DELAYED_RELEASE_TABLET | Freq: Two times a day (BID) | ORAL | 0 refills | Status: AC

## 2020-11-23 MED ORDER — OXYCODONE-ACETAMINOPHEN 5-325 MG PO TABS: 1.0000 | ORAL_TABLET | ORAL | 0 refills | Status: DC | PRN

## 2020-11-23 MED ORDER — ACETAMINOPHEN 325 MG PO TABS
325.0000 mg | ORAL_TABLET | Freq: Four times a day (QID) | ORAL | Status: DC | PRN
  Administered 2020-11-25 – 2020-11-28 (×7): 650 mg via ORAL
  Administered 2020-11-28: 325 mg via ORAL
  Administered 2020-11-28 – 2020-11-30 (×4): 650 mg via ORAL
  Filled 2020-11-23 (×13): qty 2

## 2020-11-23 MED ORDER — LACTATED RINGERS IV SOLN: INTRAVENOUS | Status: DC

## 2020-11-23 MED ORDER — ZOLPIDEM TARTRATE 5 MG PO TABS: 5.0000 mg | ORAL_TABLET | Freq: Every evening | ORAL | Status: DC | PRN

## 2020-11-23 MED ORDER — PHENOL 1.4 % MT LIQD: 1.0000 | OROMUCOSAL | Status: DC | PRN

## 2020-11-23 MED ORDER — ALUM & MAG HYDROXIDE-SIMETH 200-200-20 MG/5ML PO SUSP: 30.0000 mL | ORAL | Status: DC | PRN

## 2020-11-23 MED ORDER — HYDROMORPHONE HCL 1 MG/ML IJ SOLN
0.5000 mg | INTRAMUSCULAR | Status: DC | PRN
  Administered 2020-11-23: 1 mg via INTRAVENOUS
  Administered 2020-11-24: 0.5 mg via INTRAVENOUS
  Filled 2020-11-23 (×2): qty 1

## 2020-11-23 MED ORDER — FENTANYL CITRATE PF 50 MCG/ML IJ SOSY
50.0000 ug | PREFILLED_SYRINGE | INTRAMUSCULAR | Status: DC
  Administered 2020-11-23: 50 ug via INTRAVENOUS
  Filled 2020-11-23: qty 2

## 2020-11-23 MED ORDER — TRANEXAMIC ACID-NACL 1000-0.7 MG/100ML-% IV SOLN
1000.0000 mg | Freq: Once | INTRAVENOUS | Status: AC
  Administered 2020-11-23: 1000 mg via INTRAVENOUS
  Filled 2020-11-23: qty 100

## 2020-11-23 MED ORDER — OXYCODONE HCL 5 MG PO TABS: 5.0000 mg | ORAL_TABLET | Freq: Once | ORAL | Status: DC | PRN

## 2020-11-23 MED ORDER — BUPIVACAINE LIPOSOME 1.3 % IJ SUSP
INTRAMUSCULAR | Status: AC
  Filled 2020-11-23: qty 20

## 2020-11-23 MED ORDER — PANTOPRAZOLE SODIUM 40 MG PO TBEC
40.0000 mg | DELAYED_RELEASE_TABLET | Freq: Every day | ORAL | Status: DC
Start: 2020-11-24 — End: 2020-11-30
  Administered 2020-11-24 – 2020-11-30 (×7): 40 mg via ORAL
  Filled 2020-11-23 (×7): qty 1

## 2020-11-23 MED ORDER — METOCLOPRAMIDE HCL 5 MG PO TABS
5.0000 mg | ORAL_TABLET | Freq: Three times a day (TID) | ORAL | Status: DC | PRN
Start: 2020-11-23 — End: 2020-11-30

## 2020-11-23 MED ORDER — ROPIVACAINE HCL 5 MG/ML IJ SOLN
INTRAMUSCULAR | Status: DC | PRN
  Administered 2020-11-23: 30 mL via PERINEURAL

## 2020-11-23 MED ORDER — CHLORHEXIDINE GLUCONATE 0.12 % MT SOLN
15.0000 mL | Freq: Once | OROMUCOSAL | Status: AC
Start: 2020-11-23 — End: 2020-11-23
  Administered 2020-11-23: 15 mL via OROMUCOSAL

## 2020-11-23 MED ORDER — ONDANSETRON HCL 4 MG/2ML IJ SOLN
4.0000 mg | Freq: Four times a day (QID) | INTRAMUSCULAR | Status: DC | PRN
  Administered 2020-11-24: 4 mg via INTRAVENOUS
  Filled 2020-11-23: qty 2

## 2020-11-23 MED ORDER — DIPHENHYDRAMINE HCL 12.5 MG/5ML PO ELIX: 12.5000 mg | ORAL_SOLUTION | ORAL | Status: DC | PRN

## 2020-11-23 MED ORDER — PROPOFOL 500 MG/50ML IV EMUL
INTRAVENOUS | Status: DC | PRN
  Administered 2020-11-23: 75 ug/kg/min via INTRAVENOUS

## 2020-11-23 MED ORDER — LIDOCAINE 2% (20 MG/ML) 5 ML SYRINGE
INTRAMUSCULAR | Status: DC | PRN
  Administered 2020-11-23: 40 mg via INTRAVENOUS

## 2020-11-23 MED ORDER — BUPIVACAINE-EPINEPHRINE (PF) 0.25% -1:200000 IJ SOLN
INTRAMUSCULAR | Status: DC | PRN
  Administered 2020-11-23: 30 mL via PERINEURAL

## 2020-11-23 MED ORDER — METOCLOPRAMIDE HCL 5 MG/ML IJ SOLN: 5.0000 mg | Freq: Three times a day (TID) | INTRAMUSCULAR | Status: DC | PRN

## 2020-11-23 MED ORDER — ONDANSETRON HCL 4 MG/2ML IJ SOLN: 4.0000 mg | Freq: Once | INTRAMUSCULAR | Status: DC | PRN

## 2020-11-23 MED ORDER — TRANEXAMIC ACID 1000 MG/10ML IV SOLN
INTRAVENOUS | Status: DC | PRN
  Administered 2020-11-23: 2000 mg via TOPICAL

## 2020-11-23 MED ORDER — MIDAZOLAM HCL 2 MG/2ML IJ SOLN
1.0000 mg | INTRAMUSCULAR | Status: DC
Start: 2020-11-23 — End: 2020-11-23
  Filled 2020-11-23: qty 2

## 2020-11-23 MED ORDER — TIZANIDINE HCL 2 MG PO TABS: 2.0000 mg | ORAL_TABLET | Freq: Four times a day (QID) | ORAL | 0 refills | Status: DC | PRN

## 2020-11-23 MED ORDER — MENTHOL 3 MG MT LOZG
1.0000 | LOZENGE | OROMUCOSAL | Status: DC | PRN
Start: 2020-11-23 — End: 2020-11-30

## 2020-11-23 MED ORDER — OXYCODONE HCL 5 MG PO TABS
10.0000 mg | ORAL_TABLET | ORAL | Status: DC | PRN
  Administered 2020-11-24 – 2020-11-26 (×3): 10 mg via ORAL
  Filled 2020-11-23: qty 2

## 2020-11-23 MED ORDER — SODIUM CHLORIDE (PF) 0.9 % IJ SOLN
INTRAMUSCULAR | Status: DC | PRN
  Administered 2020-11-23: 50 mL

## 2020-11-23 MED ORDER — POLYETHYLENE GLYCOL 3350 17 G PO PACK
17.0000 g | PACK | Freq: Every day | ORAL | Status: DC | PRN
  Administered 2020-11-25 – 2020-11-26 (×2): 17 g via ORAL
  Filled 2020-11-23 (×3): qty 1

## 2020-11-23 MED ORDER — FAMOTIDINE 20 MG PO TABS
20.0000 mg | ORAL_TABLET | Freq: Every day | ORAL | Status: DC
  Administered 2020-11-24 – 2020-11-30 (×7): 20 mg via ORAL
  Filled 2020-11-23 (×7): qty 1

## 2020-11-23 MED ORDER — VANCOMYCIN HCL IN DEXTROSE 1-5 GM/200ML-% IV SOLN
1000.0000 mg | INTRAVENOUS | Status: AC
Start: 2020-11-23 — End: 2020-11-23
  Administered 2020-11-23: 1000 mg via INTRAVENOUS
  Filled 2020-11-23: qty 200

## 2020-11-23 MED ORDER — OXYCODONE HCL 5 MG PO TABS
5.0000 mg | ORAL_TABLET | ORAL | Status: DC | PRN
  Administered 2020-11-23 – 2020-11-26 (×11): 10 mg via ORAL
  Filled 2020-11-23 (×13): qty 2

## 2020-11-23 MED ORDER — FLUOXETINE HCL 10 MG PO CAPS
10.0000 mg | ORAL_CAPSULE | Freq: Every day | ORAL | Status: DC
  Administered 2020-11-24 – 2020-11-30 (×7): 10 mg via ORAL
  Filled 2020-11-23 (×7): qty 1

## 2020-11-23 MED ORDER — ASPIRIN 81 MG PO CHEW
81.0000 mg | CHEWABLE_TABLET | Freq: Two times a day (BID) | ORAL | Status: DC
  Administered 2020-11-23 – 2020-11-30 (×14): 81 mg via ORAL
  Filled 2020-11-23 (×14): qty 1

## 2020-11-23 MED ORDER — PHENYLEPHRINE HCL-NACL 20-0.9 MG/250ML-% IV SOLN
INTRAVENOUS | Status: AC
  Filled 2020-11-23: qty 250

## 2020-11-23 MED ORDER — TRANEXAMIC ACID 1000 MG/10ML IV SOLN
2000.0000 mg | INTRAVENOUS | Status: DC
  Filled 2020-11-23: qty 20

## 2020-11-23 MED ORDER — BUPIVACAINE LIPOSOME 1.3 % IJ SUSP
INTRAMUSCULAR | Status: DC | PRN
  Administered 2020-11-23: 20 mL

## 2020-11-23 MED ORDER — BUPIVACAINE-EPINEPHRINE (PF) 0.25% -1:200000 IJ SOLN
INTRAMUSCULAR | Status: AC
  Filled 2020-11-23: qty 30

## 2020-11-23 MED ORDER — BUPIVACAINE LIPOSOME 1.3 % IJ SUSP: 20.0000 mL | Freq: Once | INTRAMUSCULAR | Status: DC

## 2020-11-23 MED ORDER — SODIUM CHLORIDE 0.9 % IR SOLN
Status: DC | PRN
  Administered 2020-11-23: 1000 mL

## 2020-11-23 MED ORDER — ORAL CARE MOUTH RINSE: 15.0000 mL | Freq: Once | OROMUCOSAL | Status: AC

## 2020-11-23 MED ORDER — 0.9 % SODIUM CHLORIDE (POUR BTL) OPTIME
TOPICAL | Status: DC | PRN
  Administered 2020-11-23: 1000 mL

## 2020-11-23 MED ORDER — ONDANSETRON HCL 4 MG/2ML IJ SOLN
INTRAMUSCULAR | Status: DC | PRN
  Administered 2020-11-23: 4 mg via INTRAVENOUS

## 2020-11-23 MED ORDER — LEVOTHYROXINE SODIUM 100 MCG PO TABS
100.0000 ug | ORAL_TABLET | Freq: Every day | ORAL | Status: DC
  Administered 2020-11-24 – 2020-11-30 (×7): 100 ug via ORAL
  Filled 2020-11-23 (×7): qty 1

## 2020-11-23 MED ORDER — KCL IN DEXTROSE-NACL 20-5-0.45 MEQ/L-%-% IV SOLN
INTRAVENOUS | Status: DC
Start: 2020-11-23 — End: 2020-11-30
  Filled 2020-11-23 (×2): qty 1000

## 2020-11-23 MED ORDER — TRANEXAMIC ACID-NACL 1000-0.7 MG/100ML-% IV SOLN
1000.0000 mg | INTRAVENOUS | Status: AC
  Administered 2020-11-23: 1000 mg via INTRAVENOUS
  Filled 2020-11-23: qty 100

## 2020-11-23 MED ORDER — ONDANSETRON HCL 4 MG PO TABS
4.0000 mg | ORAL_TABLET | Freq: Four times a day (QID) | ORAL | Status: DC | PRN
  Administered 2020-11-25: 4 mg via ORAL
  Filled 2020-11-23: qty 1

## 2020-11-23 MED ORDER — PROPOFOL 10 MG/ML IV BOLUS
INTRAVENOUS | Status: DC | PRN
  Administered 2020-11-23: 20 mg via INTRAVENOUS

## 2020-11-23 SURGICAL SUPPLY — 44 items
ATTUNE MED DOME PAT 41 KNEE (Knees) ×2 IMPLANT
ATTUNE PS FEM RT SZ 5 CEM KNEE (Femur) ×2 IMPLANT
ATTUNE PSRP INSR SZ5 5 KNEE (Insert) ×2 IMPLANT
BAG COUNTER SPONGE SURGICOUNT (BAG) IMPLANT
BAG DECANTER FOR FLEXI CONT (MISCELLANEOUS) ×2 IMPLANT
BAG ZIPLOCK 12X15 (MISCELLANEOUS) ×2 IMPLANT
BASE TIBIA ATTUNE KNEE SYS SZ6 (Knees) ×1 IMPLANT
BLADE SAG 18X100X1.27 (BLADE) ×2 IMPLANT
BLADE SAW SGTL 11.0X1.19X90.0M (BLADE) ×2 IMPLANT
BLADE SURG SZ10 CARB STEEL (BLADE) ×4 IMPLANT
BNDG ELASTIC 6X10 VLCR STRL LF (GAUZE/BANDAGES/DRESSINGS) ×2 IMPLANT
BOWL SMART MIX CTS (DISPOSABLE) ×2 IMPLANT
CEMENT HV SMART SET (Cement) ×4 IMPLANT
COVER SURGICAL LIGHT HANDLE (MISCELLANEOUS) ×2 IMPLANT
CUFF TOURN SGL QUICK 34 (TOURNIQUET CUFF) ×1
CUFF TRNQT CYL 34X4.125X (TOURNIQUET CUFF) ×1 IMPLANT
DECANTER SPIKE VIAL GLASS SM (MISCELLANEOUS) ×6 IMPLANT
DRAPE INCISE IOBAN 66X45 STRL (DRAPES) ×6 IMPLANT
DRAPE U-SHAPE 47X51 STRL (DRAPES) ×2 IMPLANT
DRSG AQUACEL AG ADV 3.5X10 (GAUZE/BANDAGES/DRESSINGS) ×2 IMPLANT
DURAPREP 26ML APPLICATOR (WOUND CARE) ×2 IMPLANT
ELECT REM PT RETURN 15FT ADLT (MISCELLANEOUS) ×2 IMPLANT
GLOVE SRG 8 PF TXTR STRL LF DI (GLOVE) ×1 IMPLANT
GLOVE SURG ENC MOIS LTX SZ7.5 (GLOVE) ×2 IMPLANT
GLOVE SURG ENC MOIS LTX SZ8.5 (GLOVE) ×2 IMPLANT
GLOVE SURG UNDER POLY LF SZ8 (GLOVE) ×1
GLOVE SURG UNDER POLY LF SZ9 (GLOVE) ×2 IMPLANT
GOWN STRL REUS W/TWL XL LVL3 (GOWN DISPOSABLE) ×4 IMPLANT
HANDPIECE INTERPULSE COAX TIP (DISPOSABLE) ×1
HOOD PEEL AWAY FLYTE STAYCOOL (MISCELLANEOUS) ×6 IMPLANT
NEEDLE HYPO 21X1.5 SAFETY (NEEDLE) ×4 IMPLANT
NS IRRIG 1000ML POUR BTL (IV SOLUTION) ×2 IMPLANT
PACK TOTAL KNEE CUSTOM (KITS) ×2 IMPLANT
PROTECTOR NERVE ULNAR (MISCELLANEOUS) ×2 IMPLANT
SET HNDPC FAN SPRY TIP SCT (DISPOSABLE) ×1 IMPLANT
SUT VIC AB 1 CTX 36 (SUTURE) ×1
SUT VIC AB 1 CTX36XBRD ANBCTR (SUTURE) ×1 IMPLANT
SUT VIC AB 3-0 CT1 27 (SUTURE) ×4
SUT VIC AB 3-0 CT1 TAPERPNT 27 (SUTURE) ×4 IMPLANT
SYR CONTROL 10ML LL (SYRINGE) ×4 IMPLANT
TIBIA ATTUNE KNEE SYS BASE SZ6 (Knees) ×2 IMPLANT
TRAY FOLEY MTR SLVR 16FR STAT (SET/KITS/TRAYS/PACK) ×2 IMPLANT
WATER STERILE IRR 1000ML POUR (IV SOLUTION) ×4 IMPLANT
WRAP KNEE MAXI GEL POST OP (GAUZE/BANDAGES/DRESSINGS) ×2 IMPLANT

## 2020-11-23 NOTE — Transfer of Care (Signed)
Immediate Anesthesia Transfer of Care Note  Patient: Tonya Johns  Procedure(s) Performed: RIGHT TOTAL KNEE ARTHROPLASTY (Right: Knee)  Patient Location: PACU  Anesthesia Type:MAC and Spinal  Level of Consciousness: awake, alert , oriented and patient cooperative  Airway & Oxygen Therapy: Patient Spontanous Breathing and Patient connected to face mask oxygen  Post-op Assessment: Report given to RN and Post -op Vital signs reviewed and stable  Post vital signs: Reviewed and stable  Last Vitals:  Vitals Value Taken Time  BP 155/75 11/23/20 1451  Temp    Pulse    Resp 17 11/23/20 1454  SpO2    Vitals shown include unvalidated device data.  Last Pain:  Vitals:   11/23/20 1202  TempSrc:   PainSc: 0-No pain         Complications: No notable events documented.

## 2020-11-23 NOTE — Plan of Care (Signed)
  Problem: Activity: Goal: Ability to avoid complications of mobility impairment will improve Outcome: Progressing   Problem: Clinical Measurements: Goal: Postoperative complications will be avoided or minimized Outcome: Progressing   Problem: Pain Management: Goal: Pain level will decrease with appropriate interventions Outcome: Progressing   Problem: Skin Integrity: Goal: Will show signs of wound healing Outcome: Progressing   

## 2020-11-23 NOTE — Interval H&P Note (Signed)
History and Physical Interval Note:  11/23/2020 12:33 PM  Tonya Johns  has presented today for surgery, with the diagnosis of RIGHT KNEE OSTEOARTHRITIS.  The various methods of treatment have been discussed with the patient and family. After consideration of risks, benefits and other options for treatment, the patient has consented to  Procedure(s): RIGHT TOTAL KNEE ARTHROPLASTY (Right) as a surgical intervention.  The patient's history has been reviewed, patient examined, no change in status, stable for surgery.  I have reviewed the patient's chart and labs.  Questions were answered to the patient's satisfaction.     Nestor Lewandowsky

## 2020-11-23 NOTE — Evaluation (Signed)
Physical Therapy Evaluation Patient Details Name: Tonya Johns MRN: [id number] DOB: Jun 28, 1930 Today's Date: 11/23/2020  History of Present Illness  pt is 89 yr admit s/p R TKA , with PMH of LTKA in 2018, RA, depression, DM, fibromyalogia, HTN, and back surgery  Clinical Impression  Pt tolerated first session POD0 well. Pt with decreased strength and activity tolerance and will benefit from PT to continue to educate and work on transfers, ambulation and  stair training. Pt stated the steps are challenging for her at baseline as well.       Recommendations for follow up therapy are one component of a multi-disciplinary discharge planning process, led by the attending physician.  Recommendations may be updated based on patient status, additional functional criteria and insurance authorization.  Follow Up Recommendations Follow physician's recommendations for discharge plan and follow up therapies (chart states HHPT with aide)    Assistance Recommended at Discharge Set up Supervision/Assistance  Functional Status Assessment Patient has had a recent decline in their functional status and demonstrates the ability to make significant improvements in function in a reasonable and predictable amount of time.  Equipment Recommendations  None recommended by PT    Recommendations for Other Services       Precautions / Restrictions Precautions Precautions: Knee Precaution Comments: educated with proper positioning of knee and precautions . Restrictions Weight Bearing Restrictions: No      Mobility  Bed Mobility Overal bed mobility: Needs Assistance Bed Mobility: Supine to Sit;Sit to Supine     Supine to sit: Min assist     General bed mobility comments: assist and cues for sequencing , assist with LE and upper body    Transfers Overall transfer level: Needs assistance Equipment used: Rolling walker (2 wheels) Transfers: Sit to/from Stand Sit to Stand: Min assist            General transfer comment: assit with a little boost from surface. and cues for RW hand placement and safety    Ambulation/Gait Ambulation/Gait assistance: Min assist Gait Distance (Feet): 5 Feet Assistive device: Rolling walker (2 wheels) Gait Pattern/deviations: Step-to pattern     General Gait Details: tolerated a few steps with pivot from bed to recliner  Stairs            Wheelchair Mobility    Modified Rankin (Stroke Patients Only)       Balance Overall balance assessment: Needs assistance Sitting-balance support: Feet supported;No upper extremity supported Sitting balance-Leahy Scale: Good     Standing balance support: Bilateral upper extremity supported;During functional activity Standing balance-Leahy Scale: Fair                               Pertinent Vitals/Pain Pain Assessment: 0-10 Pain Score: 4  Pain Location: R knee and thigh area Pain Descriptors / Indicators: Aching;Sore Pain Intervention(s): Limited activity within patient's tolerance;Monitored during session    Home Living Family/patient expects to be discharged to:: Private residence Living Arrangements: Children Available Help at Discharge: Family (dtr to help pt) Type of Home: House Home Access: Stairs to enter Entrance Stairs-Rails: Right Entrance Stairs-Number of Steps: 5   Home Layout: One level Home Equipment: Shower seat;Shower seat - built Equities trader (2 wheels);Cane - single point Additional Comments: Pt's chart states that the daughter will be able to stay and help patietn upon DC .    Prior Function Prior Level of Function : Independent/Modified Independent  Mobility Comments: pt states she uses a "waling stick" and likes to get int eh yard and pick weeds, and move about.       Hand Dominance        Extremity/Trunk Assessment        Lower Extremity Assessment Lower Extremity Assessment: RLE deficits/detail RLE Deficits /  Details: 0-70 in supine and sitting. pt able to perform SLR with slight assistance so grossly 3/5 for 4 reps .       Communication   Communication: No difficulties  Cognition Arousal/Alertness: Awake/alert Behavior During Therapy: WFL for tasks assessed/performed Overall Cognitive Status: Within Functional Limits for tasks assessed                                          General Comments      Exercises Total Joint Exercises Ankle Circles/Pumps: AROM;Both;10 reps;Supine Quad Sets: AROM;Supine;Right;10 reps Heel Slides: AROM;Supine;Right;5 reps Hip ABduction/ADduction: AROM;Supine;Right;5 reps Straight Leg Raises: AROM;Supine;Right;5 reps Goniometric ROM: 0-70   Assessment/Plan    PT Assessment Patient needs continued PT services  PT Problem List Decreased strength;Decreased range of motion;Decreased activity tolerance;Decreased mobility       PT Treatment Interventions DME instruction;Gait training;Therapeutic exercise;Stair training;Functional mobility training;Therapeutic activities;Patient/family education    PT Goals (Current goals can be found in the Care Plan section)  Acute Rehab PT Goals Patient Stated Goal: I want to be active and be able to get around better PT Goal Formulation: With patient Time For Goal Achievement: 12/06/20    Frequency 7X/week   Barriers to discharge        Co-evaluation               AM-PAC PT "6 Clicks" Mobility  Outcome Measure Help needed turning from your back to your side while in a flat bed without using bedrails?: A Little Help needed moving from lying on your back to sitting on the side of a flat bed without using bedrails?: A Little Help needed moving to and from a bed to a chair (including a wheelchair)?: A Little Help needed standing up from a chair using your arms (e.g., wheelchair or bedside chair)?: A Little Help needed to walk in hospital room?: A Little Help needed climbing 3-5 steps with a  railing? : A Little 6 Click Score: 18    End of Session Equipment Utilized During Treatment: Gait belt Activity Tolerance: Patient tolerated treatment well Patient left: in chair;with call bell/phone within reach;with family/visitor present Nurse Communication: Mobility status PT Visit Diagnosis: Other abnormalities of gait and mobility (R26.89)    Time: 1830-1915 PT Time Calculation (min) (ACUTE ONLY): 45 min   Charges:   PT Evaluation $PT Eval Low Complexity: 1 Low PT Treatments $Therapeutic Exercise: 8-22 mins $Therapeutic Activity: 8-22 mins        Alicia Seib, PT, MPT Acute Rehabilitation Services Office: [phone] Pager: [phone] 11/23/2020   Marella Bile 11/23/2020, 9:11 PM

## 2020-11-23 NOTE — Discharge Instructions (Signed)

## 2020-11-23 NOTE — Anesthesia Procedure Notes (Signed)
Spinal  Patient location during procedure: OR Start time: 11/23/2020 12:45 PM End time: 11/23/2020 12:48 PM Reason for block: surgical anesthesia Staffing Performed: anesthesiologist  Anesthesiologist: Mal Amabile, MD Preanesthetic Checklist Completed: patient identified, IV checked, site marked, risks and benefits discussed, surgical consent, monitors and equipment checked, pre-op evaluation and timeout performed Spinal Block Patient position: sitting Prep: DuraPrep and site prepped and draped Patient monitoring: heart rate, cardiac monitor, continuous pulse ox and blood pressure Approach: midline Location: L3-4 Injection technique: single-shot Needle Needle type: Pencan  Needle gauge: 24 G Needle length: 9 cm Needle insertion depth: 6 cm Assessment Sensory level: T4 Events: CSF return Additional Notes Patient tolerated procedure well. Adequate sensory level.

## 2020-11-23 NOTE — Op Note (Signed)
PATIENT ID:      Tonya Johns  MRN:     [id number] DOB/AGE:    [birth year] / 85 y.o.       OPERATIVE REPORT   DATE OF PROCEDURE:  11/23/2020      PREOPERATIVE DIAGNOSIS:   RIGHT KNEE OSTEOARTHRITIS      Estimated body mass index is 31.24 kg/m as calculated from the following:   Height as of this encounter: 5\' 4"  (1.626 m).   Weight as of this encounter: 82.6 kg.                                                       POSTOPERATIVE DIAGNOSIS:   Same                                                                  PROCEDURE:  Procedure(s): RIGHT TOTAL KNEE ARTHROPLASTY Using DepuyAttune RP implants #5R Femur, #6Tibia, 5 mm Attune RP bearing, 41 Patella    SURGEON:  ASSISTANT:   Nestor Lewandowsky. Tomi Likens   (Present and scrubbed throughout the case, critical for assistance with exposure, retraction, instrumentation, and closure.)        ANESTHESIA: Spinal, 20cc Exparel, 50cc 0.25% Marcaine EBL: 300 cc FLUID REPLACEMENT: 1500 cc crystaloid TOURNIQUET: DRAINS: None TRANEXAMIC ACID: 1gm IV, 2gm topical COMPLICATIONS:  None         INDICATIONS FOR PROCEDURE: The patient has  RIGHT KNEE OSTEOARTHRITIS, Varus deformities, XR shows bone on bone arthritis, lateral subluxation of tibia. Patient has failed all conservative measures including anti-inflammatory medicines, narcotics, attempts at exercise and weight loss, cortisone injections and viscosupplementation.  Risks and benefits of surgery have been discussed, questions answered.   DESCRIPTION OF PROCEDURE: The patient identified by armband, received  IV antibiotics, in the holding area at [facility]. Patient taken to the operating room, appropriate anesthetic monitors were attached, and Spinal anesthesia was  induced. IV Tranexamic acid was given.Tourniquet applied high to the operative thigh. Lateral post and foot positioner applied to the table, the lower extremity was then prepped and draped in usual sterile fashion from the  toes to the tourniquet. Time-out procedure was performed. [facility]. [facility] PAC, was present and scrubbed throughout the case, critical for assistance with, positioning, exposure, retraction, instrumentation, and closure.The skin and subcutaneous tissue along the incision was injected with 20 cc of a mixture of Exparel and Marcaine solution, using a 20-gauge by 1-1/2 inch needle. We began the operation, with the knee flexed 130 degrees, by making the anterior midline incision starting at handbreadth above the patella going over the patella 1 cm medial to and 4 cm distal to the tibial tubercle. Small bleeders in the skin and the subcutaneous tissue identified and cauterized. Transverse retinaculum was incised and reflected medially and a medial parapatellar arthrotomy was accomplished. the patella was everted and theprepatellar fat pad resected. The superficial medial collateral ligament was then elevated from anterior to posterior along the proximal flare of the tibia and anterior half of the menisci resected. The knee was hyperflexed exposing bone on bone arthritis. Peripheral and  notch osteophytes as well as the cruciate ligaments were then resected. We continued to work our way around posteriorly along the proximal tibia, and externally rotated the tibia subluxing it out from underneath the femur. A McHale PCL retractor was placed through the notch and a lateral Hohmann retractor placed, and we then entered the proximal tibia in line with the Depuy starter drill in line with the axis of the tibia followed by an intramedullary guide rod and 0-degree posterior slope cutting guide. The tibial cutting guide, 4 degree posterior sloped, was pinned into place allowing resection of 0 mm of bone medially and 12 mm of bone laterally. Satisfied with the tibial resection, we then entered the distal femur 2 mm anterior to the PCL origin with the intramedullary guide rod and applied the distal femoral cutting guide set at 9 mm,  with 5 degrees of valgus. This was pinned along the epicondylar axis. At this point, the distal femoral cut was accomplished without difficulty. We then sized for a #5R femoral component and pinned the guide in 3 degrees of external rotation. The chamfer cutting guide was pinned into place. The anterior, posterior, and chamfer cuts were accomplished without difficulty followed by the Attune RP box cutting guide and the box cut. We also removed posterior osteophytes from the posterior femoral condyles. The posterior capsule was injected with Exparel solution. The knee was brought into full extension. We checked our extension gap and fit a 5 mm bearing. Distracting in extension with a lamina spreader,  bleeders in the posterior capsule, Posterior medial and posterior lateral gutter were cauterized.  The transexamic acid-soaked sponge was then placed in the gap of the knee in extension. The knee was flexed 30. The posterior patella cut was accomplished with the 9.5 mm Attune cutting guide, sized for a 57mm dome, and the fixation pegs drilled.The knee was then once again hyperflexed exposing the proximal tibia. We sized for a # 6 tibial base plate, applied the smokestack and the conical reamer followed by the the Delta fin keel punch. We then hammered into place the Attune RP trial femoral component, drilled the lugs, inserted a  5 mm trial bearing, trial patellar button, and took the knee through range of motion from 0-130 degrees. Medial and lateral ligamentous stability was checked. No thumb pressure was required for patellar Tracking. The tourniquet was not used. All trial components were removed, mating surfaces irrigated with pulse lavage, and dried with suction and sponges. 10 cc of the Exparel solution was applied to the cancellus bone of the patella distal femur and proximal tibia.  After waiting 30 seconds, the bony surfaces were again, dried with sponges. A double batch of DePuy HV cement was mixed and  applied to all bony metallic mating surfaces except for the posterior condyles of the femur itself. In order, we hammered into place the tibial tray and removed excess cement, the femoral component and removed excess cement. The final Attune RP bearing was inserted, and the knee brought to full extension with compression. The patellar button was clamped into place, and excess cement removed. The knee was held at 30 flexion with compression, while the cement cured. The wound was irrigated out with normal saline solution pulse lavage. The rest of the Exparel was injected into the parapatellar arthrotomy, subcutaneous tissues, and periosteal tissues. The parapatellar arthrotomy was closed with running #1 Vicryl suture. The subcutaneous tissue with 3-0 undyed Vicryl suture, and the skin with running 3-0 SQ vicryl. An Aquacil and Ace  wrap were applied. The patient was taken to recovery room without difficulty.   Nestor Lewandowsky 11/23/2020, 12:34 PM

## 2020-11-23 NOTE — Anesthesia Preprocedure Evaluation (Addendum)
Anesthesia Evaluation  Patient identified by MRN, date of birth, ID band Patient awake    Reviewed: Allergy & Precautions, NPO status , Patient's Chart, lab work & pertinent test results, reviewed documented beta blocker date and time   Airway Mallampati: II  TM Distance: >3 FB Neck ROM: Full    Dental  (+) Edentulous Upper, Edentulous Lower   Pulmonary    Pulmonary exam normal breath sounds clear to auscultation       Cardiovascular Normal cardiovascular exam+ dysrhythmias + Valvular Problems/Murmurs AI  Rhythm:Regular Rate:Normal  Echo 03/24/2018 1. The left ventricle has normal systolic function with an ejection fraction of 60-65%. The cavity size was normal. There is mild asymmetric left ventricular hypertrophy of the anteroseptal wall. Left ventricular diastolic Doppler parameters are consistent with impaired relaxation.  2. The right ventricle has normal systolic function. The cavity was normal. There is no increase in right ventricular wall thickness.  3. Right atrial size was mildly dilated.  4. The mitral valve is degenerative.  5. The tricuspid valve is normal in structure and degenerative.  6. The aortic valve is tricuspid Mild thickening of the aortic valve Mild calcification of the aortic valve. Aortic valve regurgitation is mild to moderate by color flow Doppler. The jet is centrally-directed.  7. The pulmonic valve was normal in structure.  EKG 10/08/2020 NSR, incomplete LBBB pattern   Neuro/Psych    GI/Hepatic Neg liver ROS, GERD  Medicated and Controlled,diverticulitis   Endo/Other  Hypothyroidism S/P Radioactive Iodine therapy Hyperlipidemia Obesity  Renal/GU Renal InsufficiencyRenal disease     Musculoskeletal   Abdominal (+) + obese,   Peds  Hematology  (+) anemia ,   Anesthesia Other Findings   Reproductive/Obstetrics                            Anesthesia  Physical Anesthesia Plan  ASA: 3  Anesthesia Plan: Spinal   Post-op Pain Management:  Regional for Post-op pain   Induction: Intravenous  PONV Risk Score and Plan: 3 and Treatment may vary due to age or medical condition, Propofol infusion and Ondansetron  Airway Management Planned: Natural Airway and Simple Face Mask  Additional Equipment: None  Intra-op Plan:   Post-operative Plan:   Informed Consent: I have reviewed the patients History and Physical, chart, labs and discussed the procedure including the risks, benefits and alternatives for the proposed anesthesia with the patient or authorized representative who has indicated his/her understanding and acceptance.       Plan Discussed with: CRNA and Anesthesiologist  Anesthesia Plan Comments:         Anesthesia Quick Evaluation

## 2020-11-23 NOTE — Progress Notes (Signed)
Orthopedic Tech Progress Note Patient Details:  Tonya Johns 18-Jul-1930 [id number]  Ortho Devices Type of Ortho Device: Bone foam zero knee Ortho Device/Splint Interventions: Application   Post Interventions Patient Tolerated: Well Instructions Provided: Care of device  Saul Fordyce 11/23/2020, 3:04 PM

## 2020-11-23 NOTE — Progress Notes (Signed)
PT Note  Patient Details Name: Tonya Johns MRN: [id number] DOB: 1931/01/29  Eval completed, pt tolerated getting out of bed and few steps to the recliner this evening with RW. Full write up to follow.         Marella Bile 11/23/2020, 7:20 PM

## 2020-11-23 NOTE — Anesthesia Procedure Notes (Signed)
Anesthesia Regional Block: Adductor canal block   Pre-Anesthetic Checklist: , timeout performed,  Correct Patient, Correct Site, Correct Laterality,  Correct Procedure, Correct Position, site marked,  Risks and benefits discussed,  Surgical consent,  Pre-op evaluation,  At surgeon's request and post-op pain management  Laterality: Right  Prep: chloraprep       Needles:  Injection technique: Single-shot  Needle Type: Echogenic Stimulator Needle     Needle Length: 10cm  Needle Gauge: 21   Needle insertion depth: 7 cm   Additional Needles:   Procedures:,,,, ultrasound used (permanent image in chart),,    Narrative:  Start time: 11/23/2020 11:12 AM End time: 11/23/2020 11:17 AM Injection made incrementally with aspirations every 5 mL.  Performed by: Personally  Anesthesiologist: Mal Amabile, MD  Additional Notes: Timeout performed. Patient sedated. Relevant anatomy ID'd using [place]. Incremental 2-42ml injection of LA with frequent aspiration. Patient tolerated procedure well.    Right Adductor Canal Block

## 2020-11-23 NOTE — Progress Notes (Signed)
AssistedDr. Foster with right, ultrasound guided, adductor canal block. Side rails up, monitors on throughout procedure. See vital signs in flow sheet. Tolerated Procedure well.  

## 2020-11-23 NOTE — Anesthesia Postprocedure Evaluation (Signed)
Anesthesia Post Note  Patient: Teka Chanda  Procedure(s) Performed: RIGHT TOTAL KNEE ARTHROPLASTY (Right: Knee)     Patient location during evaluation: PACU Anesthesia Type: Spinal Level of consciousness: oriented and awake and alert Pain management: pain level controlled Vital Signs Assessment: post-procedure vital signs reviewed and stable Respiratory status: spontaneous breathing, respiratory function stable and nonlabored ventilation Cardiovascular status: blood pressure returned to baseline and stable Postop Assessment: no headache, no backache, no apparent nausea or vomiting, spinal receding and patient able to bend at knees Anesthetic complications: no   No notable events documented.  Last Vitals:  Vitals:   11/23/20 1655 11/23/20 1657  BP: (!) 180/80 (!) 168/76  Pulse: 72 76  Resp: 17   Temp: 36.6 C   SpO2: 99%     Last Pain:  Vitals:   11/23/20 1924  TempSrc:   PainSc: 6                  Allyce Bochicchio A.

## 2020-11-24 ENCOUNTER — Encounter (HOSPITAL_COMMUNITY): Payer: Self-pay | Admitting: Orthopedic Surgery

## 2020-11-24 LAB — CBC
HCT: 30.9 % — ABNORMAL LOW (ref 36.0–46.0)
Hemoglobin: 9.9 g/dL — ABNORMAL LOW (ref 12.0–15.0)
MCH: 30.2 pg (ref 26.0–34.0)
MCHC: 32 g/dL (ref 30.0–36.0)
MCV: 94.2 fL (ref 80.0–100.0)
Platelets: 195 10*3/uL (ref 150–400)
RBC: 3.28 MIL/uL — ABNORMAL LOW (ref 3.87–5.11)
RDW: 13.7 % (ref 11.5–15.5)
WBC: 13.1 10*3/uL — ABNORMAL HIGH (ref 4.0–10.5)
nRBC: 0 % (ref 0.0–0.2)

## 2020-11-24 LAB — BASIC METABOLIC PANEL
Anion gap: 5 (ref 5–15)
BUN: 15 mg/dL (ref 8–23)
CO2: 24 mmol/L (ref 22–32)
Calcium: 8.8 mg/dL — ABNORMAL LOW (ref 8.9–10.3)
Chloride: 106 mmol/L (ref 98–111)
Creatinine, Ser: 1.17 mg/dL — ABNORMAL HIGH (ref 0.44–1.00)
GFR, Estimated: 45 mL/min — ABNORMAL LOW (ref >60)
Glucose, Bld: 226 mg/dL — ABNORMAL HIGH (ref 70–99)
Potassium: 4.7 mmol/L (ref 3.5–5.1)
Sodium: 135 mmol/L (ref 135–145)

## 2020-11-24 NOTE — Plan of Care (Signed)
  Problem: Activity: Goal: Ability to avoid complications of mobility impairment will improve Outcome: Progressing   Problem: Clinical Measurements: Goal: Postoperative complications will be avoided or minimized Outcome: Progressing   Problem: Pain Management: Goal: Pain level will decrease with appropriate interventions Outcome: Progressing   Problem: Skin Integrity: Goal: Will show signs of wound healing Outcome: Progressing   

## 2020-11-24 NOTE — TOC Transition Note (Signed)
Transition of Care [facility]) - CM/SW Discharge Note  Patient Details  Name: Landrie Beale MRN: [id number] Date of Birth: 1930-04-29  [facility] CM/SW Contact:  Sherie Don, LCSW Phone Number: 11/24/2020, 10:13 AM  Clinical Narrative: Patient is expected to discharge home after working with PT. CSW met with patient and daughter to confirm discharge plan. Patient will discharge home with HHPT and an aide through [place] and then will transition to OPPT at [facility]. Patient has a rolling walker, toilet riser, and BSC at home so there are no DME needs at this time. TOC signing off.  Final next level of care: [place] Barriers to Discharge: No Barriers Identified  Patient Goals and CMS Choice Patient states their goals for this hospitalization and ongoing recovery are:: Discharge home with HHPT and then transition to [place] CMS Medicare.gov Compare Post Acute Care list provided to:: Patient Choice offered to / list presented to : Patient  Discharge Plan and Services        DME Arranged: N/A DME Agency: NA HH Arranged: PT, Nurse's Aide HH Agency: [place] Representative spoke with at [place] in orthopedist's office  Readmission Risk Interventions No flowsheet data found.

## 2020-11-24 NOTE — Progress Notes (Signed)
Physical Therapy Treatment Patient Details Name: Tonya Johns MRN: [id number] DOB: 06/29/1930 Today's Date: 11/24/2020   History of Present Illness pt is 89 yr admit s/p R TKA , with PMH of LTKA in 2018, RA, depression, DM, fibromyalogia, HTN, and back surgery    PT Comments    Progressing slowly with mobility. Pain rated 8/10. Will continue to follow and progress activity as tolerated.    Recommendations for follow up therapy are one component of a multi-disciplinary discharge planning process, led by the attending physician.  Recommendations may be updated based on patient status, additional functional criteria and insurance authorization.  Follow Up Recommendations  Follow physician's recommendations for discharge plan and follow up therapies     Assistance Recommended at Discharge Frequent or constant Supervision/Assistance  Equipment Recommendations  None recommended by PT    Recommendations for Other Services       Precautions / Restrictions Precautions Precautions: Knee Restrictions Weight Bearing Restrictions: No RLE Weight Bearing: Weight bearing as tolerated     Mobility  Bed Mobility Overal bed mobility: Needs Assistance Bed Mobility: Supine to Sit     Supine to sit: Min assist     General bed mobility comments: assist and cues for sequencing , assist with LE    Transfers Overall transfer level: Needs assistance Equipment used: Rolling walker (2 wheels) Transfers: Sit to/from Stand Sit to Stand: Min assist;From elevated surface           General transfer comment: assist with a little boost from surface. and cues for RW hand placement and safety    Ambulation/Gait Ambulation/Gait assistance: Min assist Gait Distance (Feet): 25 Feet (x2) Assistive device: Rolling walker (2 wheels) Gait Pattern/deviations: Step-to pattern;Trunk flexed;Antalgic     General Gait Details: Assist to stabilize throughout distance. Cues for safety, technique, sequence,  posture, RW proximity. Distance limited by pain, fatigue. Followed with recliner and used it to transport pt back to room   Stairs             Wheelchair Mobility    Modified Rankin (Stroke Patients Only)       Balance Overall balance assessment: Needs assistance         Standing balance support: Bilateral upper extremity supported;During functional activity Standing balance-Leahy Scale: Poor                              Cognition Arousal/Alertness: Awake/alert Behavior During Therapy: WFL for tasks assessed/performed Overall Cognitive Status: Within Functional Limits for tasks assessed                                          Exercises Total Joint Exercises Ankle Circles/Pumps: AROM;Both;10 reps;Supine Quad Sets: AROM;Supine;Right;10 reps Hip ABduction/ADduction: AAROM;Right;10 reps Straight Leg Raises: AAROM;Right;10 reps Knee Flexion: AAROM;Right;10 reps;Seated Goniometric ROM: ~10-60 degrees    General Comments        Pertinent Vitals/Pain Pain Assessment: 0-10 Pain Score: 8  Pain Location: R knee and thigh area Pain Descriptors / Indicators: Discomfort;Sore;Grimacing;Guarding;Aching Pain Intervention(s): Limited activity within patient's tolerance;Monitored during session;Ice applied;Repositioned    Home Living                          Prior Function            PT Goals (current goals can now  be found in the care plan section) Progress towards PT goals: Progressing toward goals    Frequency    7X/week      PT Plan Current plan remains appropriate    Co-evaluation              AM-PAC PT "6 Clicks" Mobility   Outcome Measure  Help needed turning from your back to your side while in a flat bed without using bedrails?: A Little Help needed moving from lying on your back to sitting on the side of a flat bed without using bedrails?: A Little Help needed moving to and from a bed to a chair  (including a wheelchair)?: A Little Help needed standing up from a chair using your arms (e.g., wheelchair or bedside chair)?: A Little Help needed to walk in hospital room?: A Little Help needed climbing 3-5 steps with a railing? : A Lot 6 Click Score: 17    End of Session Equipment Utilized During Treatment: Gait belt Activity Tolerance: Patient tolerated treatment well Patient left: in chair;with call bell/phone within reach;with family/visitor present   PT Visit Diagnosis: Other abnormalities of gait and mobility (R26.89);Pain Pain - Right/Left: Right Pain - part of body: Knee     Time: 4854-6270 PT Time Calculation (min) (ACUTE ONLY): 23 min  Charges:  $Gait Training: 8-22 mins $Therapeutic Exercise: 8-22 mins                         Faye Ramsay, PT Acute Rehabilitation  Office: [phone] Pager: [phone]

## 2020-11-24 NOTE — Progress Notes (Signed)
PATIENT ID: Tonya Johns  MRN: [id number]  DOB/AGE:  May 22, 1930 / 87 y.o.  1 Day Post-Op Procedure(s) (LRB): RIGHT TOTAL KNEE ARTHROPLASTY (Right)    PROGRESS NOTE Subjective: Patient is alert, oriented, no Nausea, no Vomiting, yes passing gas. Taking PO well. Denies SOB, Chest or Calf Pain. Using Incentive Spirometer, PAS in place. Ambulate Bed to chair, Patient reports pain as 2/10 .    Objective: Vital signs in last 24 hours: Vitals:   11/23/20 1657 11/23/20 2211 11/24/20 0200 11/24/20 0615  BP: (!) 168/76 (!) 185/82 (!) 169/81 (!) 165/72  Pulse: 76 81 74 69  Resp:  16 16 16   Temp:  98.2 F (36.8 C) 98 F (36.7 C) 97.9 F (36.6 C)  TempSrc:  Oral Oral Oral  SpO2:  94% 98% 98%  Weight:      Height:          Intake/Output from previous day: I/O last 3 completed shifts: In: 3041.1 [P.O.:240; I.V.:2701.1; IV Piggyback:100] Out: 2100 [Urine:1800; Blood:300]   Intake/Output this shift: No intake/output data recorded.   LABORATORY DATA: Recent Labs    11/24/20 0331  WBC 13.1*  HGB 9.9*  HCT 30.9*  PLT 195  NA 135  K 4.7  CL 106  CO2 24  BUN 15  CREATININE 1.17*  GLUCOSE 226*  CALCIUM 8.8*    Examination: Neurologically intact ABD soft Neurovascular intact Sensation intact distally Intact pulses distally Dorsiflexion/Plantar flexion intact Incision: no drainage No cellulitis present Compartment soft}  Assessment:   1 Day Post-Op Procedure(s) (LRB): RIGHT TOTAL KNEE ARTHROPLASTY (Right) ADDITIONAL DIAGNOSIS: Expected Acute Blood Loss Anemia, obesity, LBBB, hypothyroid  Patient's anticipated LOS is less than 2 midnights, meeting these requirements: - Younger than 11 - Lives within 1 hour of care - Has a competent adult at home to recover with post-op recover - NO history of  - Chronic pain requiring opiods  - Diabetes  - Coronary Artery Disease  - Heart failure  - Heart attack  - Stroke  - DVT/VTE  - Cardiac arrhythmia  - Respiratory  Failure/COPD  - Renal failure  - Anemia  - Advanced Liver disease     Plan: PT/OT WBAT, AROM and PROM  DVT Prophylaxis:  SCDx72hrs, ASA 81 mg BID x 2 weeks DISCHARGE PLAN: Home, Today patient passes PT DISCHARGE NEEDS: HHPT, Walker, and 3-in-1 comode seat     76 11/24/2020, 8:10 AM  Patient ID: 11/26/2020, female   DOB: 02/19/1930, 87 y.o.   MRN: [id number]

## 2020-11-24 NOTE — Plan of Care (Signed)

## 2020-11-24 NOTE — Progress Notes (Signed)
Physical Therapy Treatment Patient Details Name: Tonya Johns MRN: [id number] DOB: Jul 25, 1930 Today's Date: 11/24/2020   History of Present Illness pt is 89 yr admit s/p R TKA , with PMH of LTKA in 2018, RA, depression, DM, fibromyalogia, HTN, and back surgery    PT Comments    Progressing slowly. Mobility limited by pain and fatigue. Pain 5/10 at rest, 8/10 with activity. Unfortunately, pt has not been able to meet PT goals on today. Will continue to follow and progress activity as tolerated.    Recommendations for follow up therapy are one component of a multi-disciplinary discharge planning process, led by the attending physician.  Recommendations may be updated based on patient status, additional functional criteria and insurance authorization.  Follow Up Recommendations  Follow physician's recommendations for discharge plan and follow up therapies     Assistance Recommended at Discharge Frequent or constant Supervision/Assistance  Equipment Recommendations  None recommended by PT    Recommendations for Other Services       Precautions / Restrictions Precautions Precautions: Knee Restrictions Weight Bearing Restrictions: No RLE Weight Bearing: Weight bearing as tolerated     Mobility  Bed Mobility Overal bed mobility: Needs Assistance Bed Mobility: Supine to Sit;Sit to Supine     Supine to sit: Min assist;HOB elevated Sit to supine: Min assist;HOB elevated   General bed mobility comments: assist and cues for sequencing , assist with LE. increased time.    Transfers Overall transfer level: Needs assistance Equipment used: Rolling walker (2 wheels) Transfers: Sit to/from Stand Sit to Stand: Min assist;From elevated surface           General transfer comment: Assist with a little boost from surface. Cues for safety, technique, hand/LE placement.    Ambulation/Gait Ambulation/Gait assistance: Min assist Gait Distance (Feet): 30 Feet Assistive device: Rolling  walker (2 wheels) Gait Pattern/deviations: Step-to pattern;Trunk flexed;Antalgic     General Gait Details: Assist to stabilize throughout distance. Cues for safety, technique, sequence, posture, RW proximity. Distance limited by pain, fatigue.   Stairs             Wheelchair Mobility    Modified Rankin (Stroke Patients Only)       Balance Overall balance assessment: Needs assistance         Standing balance support: Bilateral upper extremity supported Standing balance-Leahy Scale: Poor                              Cognition Arousal/Alertness: Awake/alert Behavior During Therapy: WFL for tasks assessed/performed Overall Cognitive Status: Within Functional Limits for tasks assessed                                          Exercises     General Comments        Pertinent Vitals/Pain Pain Assessment: 0-10 Pain Score: 8  Pain Location: R knee and thigh area Pain Descriptors / Indicators: Discomfort;Sore;Grimacing;Guarding;Aching Pain Intervention(s): Limited activity within patient's tolerance;Monitored during session;Ice applied;Repositioned    Home Living                          Prior Function            PT Goals (current goals can now be found in the care plan section) Progress towards PT goals: Progressing toward goals  Frequency    7X/week      PT Plan Current plan remains appropriate    Co-evaluation              AM-PAC PT "6 Clicks" Mobility   Outcome Measure  Help needed turning from your back to your side while in a flat bed without using bedrails?: A Little Help needed moving from lying on your back to sitting on the side of a flat bed without using bedrails?: A Little Help needed moving to and from a bed to a chair (including a wheelchair)?: A Little Help needed standing up from a chair using your arms (e.g., wheelchair or bedside chair)?: A Little Help needed to walk in hospital  room?: A Little Help needed climbing 3-5 steps with a railing? : A Lot 6 Click Score: 17    End of Session Equipment Utilized During Treatment: Gait belt Activity Tolerance: Patient limited by fatigue;Patient limited by pain Patient left: in bed;with call bell/phone within reach;with bed alarm set;with family/visitor present   PT Visit Diagnosis: Other abnormalities of gait and mobility (R26.89);Pain Pain - Right/Left: Right Pain - part of body: Knee     Time: 1325-1344 PT Time Calculation (min) (ACUTE ONLY): 19 min  Charges:  $Gait Training: 8-22 mins $Therapeutic Exercise: 8-22 mins                         Faye Ramsay, PT Acute Rehabilitation  Office: [phone] Pager: [phone]

## 2020-11-24 NOTE — Plan of Care (Signed)
°  Problem: Education: °Goal: Knowledge of the prescribed therapeutic regimen will improve °Outcome: Progressing °  °Problem: Clinical Measurements: °Goal: Postoperative complications will be avoided or minimized °Outcome: Progressing °  °Problem: Pain Management: °Goal: Pain level will decrease with appropriate interventions °Outcome: Progressing °  °

## 2020-11-25 ENCOUNTER — Telehealth: Payer: Medicare Other

## 2020-11-25 LAB — CBC
HCT: 26.3 % — ABNORMAL LOW (ref 36.0–46.0)
Hemoglobin: 8.5 g/dL — ABNORMAL LOW (ref 12.0–15.0)
MCH: 30.5 pg (ref 26.0–34.0)
MCHC: 32.3 g/dL (ref 30.0–36.0)
MCV: 94.3 fL (ref 80.0–100.0)
Platelets: 181 10*3/uL (ref 150–400)
RBC: 2.79 MIL/uL — ABNORMAL LOW (ref 3.87–5.11)
RDW: 14 % (ref 11.5–15.5)
WBC: 13.1 10*3/uL — ABNORMAL HIGH (ref 4.0–10.5)
nRBC: 0 % (ref 0.0–0.2)

## 2020-11-25 NOTE — Progress Notes (Signed)
PATIENT ID: Tonya Johns  MRN: [id number]  DOB/AGE:  Aug 10, 1930 / 87 y.o.  2 Days Post-Op Procedure(s) (LRB): RIGHT TOTAL KNEE ARTHROPLASTY (Right)    PROGRESS NOTE Subjective: Patient is alert, oriented, no Nausea, no Vomiting, yes passing gas. Taking PO well. Denies SOB, Chest or Calf Pain. Using Incentive Spirometer, PAS in place. Ambulate WBAT with pt walking 30 ft with therapy, Patient reports pain as 5/10 at rest and 8/10 with activity.    Objective: Vital signs in last 24 hours: Vitals:   11/24/20 0932 11/24/20 1223 11/24/20 2039 11/25/20 0449  BP: (!) 145/62 (!) 172/64 (!) 189/67 (!) 161/63  Pulse: 68 64 66 75  Resp: 15 16 18 18   Temp: 98.6 F (37 C) 98.2 F (36.8 C) 99.3 F (37.4 C) 98.8 F (37.1 C)  TempSrc: Oral Oral Oral Oral  SpO2: 99% 99% 90% 95%  Weight:      Height:          Intake/Output from previous day: I/O last 3 completed shifts: In: 2324.1 [P.O.:930; I.V.:1394.1] Out: 800 [Urine:800]   Intake/Output this shift: No intake/output data recorded.   LABORATORY DATA: Recent Labs    11/24/20 0331 11/25/20 0323  WBC 13.1* 13.1*  HGB 9.9* 8.5*  HCT 30.9* 26.3*  PLT 195 181  NA 135  --   K 4.7  --   CL 106  --   CO2 24  --   BUN 15  --   CREATININE 1.17*  --   GLUCOSE 226*  --   CALCIUM 8.8*  --     Examination: Neurologically intact Neurovascular intact Sensation intact distally Intact pulses distally Dorsiflexion/Plantar flexion intact Incision: dressing C/D/I and no drainage No cellulitis present Compartment soft}  Assessment:   2 Days Post-Op Procedure(s) (LRB): RIGHT TOTAL KNEE ARTHROPLASTY (Right) ADDITIONAL DIAGNOSIS: Expected Acute Blood Loss Anemia, obesity, LBBB, hypothyroid Anticipated LOS equal to or greater than 2 midnights due to - Age 50 and older with one or more of the following:  - Obesity  - Expected need for hospital services (PT, OT, Nursing) required for safe  discharge  - Anticipated need for postoperative  skilled nursing care or inpatient rehab   OR   - Unanticipated findings during/Post Surgery: Slow post-op progression: GI, pain control, mobility    Plan: PT/OT WBAT, AROM and PROM  DVT Prophylaxis:  SCDx72hrs, ASA 81 mg BID x 2 weeks DISCHARGE PLAN: Home, when pt passes therapy goals DISCHARGE NEEDS: HHPT, Walker, and 3-in-1 comode seat     76 11/25/2020, 8:11 AM

## 2020-11-25 NOTE — Discharge Summary (Deleted)
Patient ID: Tonya Johns MRN: [id number] DOB/AGE: 08-Jul-1930 87 y.o.  Admit date: 11/23/2020 Discharge date: 11/25/2020  Admission Diagnoses:  Principal Problem:   Osteoarthritis of right knee Active Problems:   Arthritis of right knee   Discharge Diagnoses:  Same  Past Medical History:  Diagnosis Date   AI (aortic insufficiency)    mild-moderate AI   Anemia    Arthritis    Diverticulitis    GERD (gastroesophageal reflux disease)    Hyperlipidemia    Hypothyroidism    Leg swelling    Osteoporosis     Surgeries: Procedure(s): RIGHT TOTAL KNEE ARTHROPLASTY on 11/23/2020   Consultants:   Discharged Condition: Improved  Hospital Course: Florestine Carmical is an 87 y.o. female who was admitted 11/23/2020 for operative treatment ofOsteoarthritis of right knee. Patient has severe unremitting pain that affects sleep, daily activities, and work/hobbies. After pre-op clearance the patient was taken to the operating room on 11/23/2020 and underwent  Procedure(s): RIGHT TOTAL KNEE ARTHROPLASTY.    Patient was given perioperative antibiotics:  Anti-infectives (From admission, onward)    Start     Dose/Rate Route Frequency Ordered Stop   11/23/20 0915  vancomycin (VANCOCIN) IVPB 1000 mg/200 mL premix        1,000 mg 200 mL/hr over 60 Minutes Intravenous On call to O.R. 11/23/20 0901 11/23/20 1238        Patient was given sequential compression devices, early ambulation, and chemoprophylaxis to prevent DVT.  Patient benefited maximally from hospital stay and there were no complications.    Recent vital signs: Patient Vitals for the past 24 hrs:  BP Temp Temp src Pulse Resp SpO2  11/25/20 0449 (!) 161/63 98.8 F (37.1 C) Oral 75 18 95 %  11/24/20 2039 (!) 189/67 99.3 F (37.4 C) Oral 66 18 90 %  11/24/20 1223 (!) 172/64 98.2 F (36.8 C) Oral 64 16 99 %  11/24/20 0932 (!) 145/62 98.6 F (37 C) Oral 68 15 99 %     Recent laboratory studies:  Recent Labs    11/24/20 0331  11/25/20 0323  WBC 13.1* 13.1*  HGB 9.9* 8.5*  HCT 30.9* 26.3*  PLT 195 181  NA 135  --   K 4.7  --   CL 106  --   CO2 24  --   BUN 15  --   CREATININE 1.17*  --   GLUCOSE 226*  --   CALCIUM 8.8*  --      Discharge Medications:   Allergies as of 11/25/2020       Reactions   Penicillins Rash   Did it involve swelling of the face/tongue/throat, SOB, or low BP? No Did it involve sudden or severe rash/hives, skin peeling, or any reaction on the inside of your mouth or nose? Yes Did you need to seek medical attention at a hospital or doctor's office? No When did it last happen?      approx 2005 If all above answers are "NO", may proceed with cephalosporin use.        Medication List     STOP taking these medications    acetaminophen 500 MG tablet Commonly known as: TYLENOL       TAKE these medications    aspirin EC 81 MG tablet Take 1 tablet (81 mg total) by mouth 2 (two) times daily.   Biofreeze Roll-On 4 % Gel Generic drug: Menthol (Topical Analgesic) Apply 1 application topically daily as needed (pain).   Blue-Emu Maximum Strength 2.5 %  Liqd Generic drug: Menthol (Topical Analgesic) Apply 1 application topically daily as needed (pain).   Co Q10 100 MG Caps Take 100 mg by mouth at bedtime.   famotidine 20 MG tablet Commonly known as: PEPCID Take 20 mg by mouth daily.   FLUoxetine 10 MG capsule Commonly known as: PROZAC TAKE 1 CAPSULE BY MOUTH  DAILY   levothyroxine 100 MCG tablet Commonly known as: SYNTHROID TAKE 1 TABLET BY MOUTH  DAILY   multivitamin with minerals Tabs tablet Take 1 tablet by mouth at bedtime.   oxyCODONE-acetaminophen 5-325 MG tablet Commonly known as: PERCOCET/ROXICET Take 1 tablet by mouth every 4 (four) hours as needed for severe pain.   pantoprazole 40 MG tablet Commonly known as: PROTONIX Take 1 tablet (40 mg total) by mouth daily.   Pfizer COVID-19 Vac Bivalent injection Generic drug: COVID-19 mRNA bivalent  vaccine Proofreader) Inject into the muscle.   pravastatin 80 MG tablet Commonly known as: PRAVACHOL TAKE 1 TABLET BY MOUTH  DAILY   Salonpas 3.02-06-08 % Ptch Generic drug: Camphor-Menthol-Methyl Sal Apply 1 patch topically daily as needed (pain).   sodium chloride 0.65 % Soln nasal spray Commonly known as: OCEAN Place 2 sprays into both nostrils as needed for congestion.   tiZANidine 2 MG tablet Commonly known as: ZANAFLEX Take 1 tablet (2 mg total) by mouth every 6 (six) hours as needed.               Durable Medical Equipment  (From admission, onward)           Start     Ordered   11/23/20 1649  DME Walker rolling  Once       Question:  Patient needs a walker to treat with the following condition  Answer:  Status post right knee replacement   11/23/20 1648   11/23/20 1649  DME 3 n 1  Once        11/23/20 1648              Discharge Care Instructions  (From admission, onward)           Start     Ordered   11/25/20 0000  Weight bearing as tolerated        11/25/20 0814            Diagnostic Studies: DG Chest 2 View  Result Date: 11/20/2020 CLINICAL DATA:  87 year old female with preoperative chest x-ray EXAM: CHEST - 2 VIEW COMPARISON:  01/02/2017 FINDINGS: Cardiomediastinal silhouette unchanged in size and contour. No evidence of central vascular congestion. No interlobular septal thickening. No pneumothorax or pleural effusion. Coarsened interstitial markings, with no confluent airspace disease. No acute displaced fracture. Degenerative changes of the spine. IMPRESSION: No active cardiopulmonary disease. Electronically Signed   By: Gilmer Mor D.O.   On: 11/20/2020 14:45    Disposition: Discharge disposition: 01-Home or Self Care       Discharge Instructions     Call MD / Call 911   Complete by: As directed    If you experience chest pain or shortness of breath, CALL 911 and be transported to the hospital emergency room.  If you  develope a fever above 101 F, pus (white drainage) or increased drainage or redness at the wound, or calf pain, call your surgeon's office.   Constipation Prevention   Complete by: As directed    Drink plenty of fluids.  Prune juice may be helpful.  You may use a stool softener, such as Colace (  over the counter) 100 mg twice a day.  Use MiraLax (over the counter) for constipation as needed.   Diet - low sodium heart healthy   Complete by: As directed    Driving restrictions   Complete by: As directed    No driving for 2 weeks   Increase activity slowly as tolerated   Complete by: As directed    Patient may shower   Complete by: As directed    You may shower without a dressing once there is no drainage.  Do not wash over the wound.  If drainage remains, cover wound with plastic wrap and then shower.   Post-operative opioid taper instructions:   Complete by: As directed    POST-OPERATIVE OPIOID TAPER INSTRUCTIONS: It is important to wean off of your opioid medication as soon as possible. If you do not need pain medication after your surgery it is ok to stop day one. Opioids include: Codeine, Hydrocodone(Norco, Vicodin), Oxycodone(Percocet, oxycontin) and hydromorphone amongst others.  Long term and even short term use of opiods can cause: Increased pain response Dependence Constipation Depression Respiratory depression And more.  Withdrawal symptoms can include Flu like symptoms Nausea, vomiting And more Techniques to manage these symptoms Hydrate well Eat regular healthy meals Stay active Use relaxation techniques(deep breathing, meditating, yoga) Do Not substitute Alcohol to help with tapering If you have been on opioids for less than two weeks and do not have pain than it is ok to stop all together.  Plan to wean off of opioids This plan should start within one week post op of your joint replacement. Maintain the same interval or time between taking each dose and first  decrease the dose.  Cut the total daily intake of opioids by one tablet each day Next start to increase the time between doses. The last dose that should be eliminated is the evening dose.      Weight bearing as tolerated   Complete by: As directed         Follow-up Information     Gean Birchwood, MD. Go on 12/03/2020.   Specialty: Orthopedic Surgery Why: Your appointment is scheduled for 10:15. Contact information: [address] [place] [address] [phone]         [facility] Follow up.   Specialty: Home Health Services Why: HHPT will provide 6 Home therapy visits prior to your starting Outpatient physical therapy. A bath aide will also visit to assist with bathing Contact information: [address] [place] [address] [phone]         [facility], [place]. Go on 12/03/2020.   Why: Your outpatient physical therapy will start at 11:20. Please go over to the therapy side of the office after your visit with Dr Abbe Amsterdam information: Physical Therapy [address] [place] [address] [phone]                  Signed: Dannielle Burn 11/25/2020, 8:14 AM

## 2020-11-25 NOTE — Progress Notes (Signed)
PHYSICAL THERAPY PM session  Pt just got back to bed from using BSC with MAX c/o fatigue.  Pt has NOT met goals to D/C to home today.  Lori Kropski  PTA Acute  Rehabilitation Services Pager      [phone] Office      [phone]  

## 2020-11-25 NOTE — Progress Notes (Signed)
Physical Therapy Treatment Patient Details Name: Tonya Johns MRN: [id number] DOB: 10-Mar-1930 Today's Date: 11/25/2020   History of Present Illness pt is 89 yr admit s/p R TKA , with PMH of LTKA in 2018, RA, depression, DM, fibromyalogia, HTN, and back surgery    PT Comments    POD # 2 am session First attempt to see pt on [facility] extended time attempting to have a BM.  Then pt went back to bed.  Assisted OOB to amb required increased time.  General bed mobility comments: assist and cues for sequencing , assist with LE. increased time. General transfer comment: Assist with a little boost from surface. Cues for safety, technique, hand/LE placement.  Increased difficulty due to BMI. General Gait Details: decreased amb distance due to increased c/o nausea.  Recliner pulled to pt. Unable to amb in hallway and unable to attempt stair training due to nausea.    Recommendations for follow up therapy are one component of a multi-disciplinary discharge planning process, led by the attending physician.  Recommendations may be updated based on patient status, additional functional criteria and insurance authorization.  Follow Up Recommendations  Follow physician's recommendations for discharge plan and follow up therapies     Assistance Recommended at Discharge    Equipment Recommendations  None recommended by PT    Recommendations for Other Services       Precautions / Restrictions Precautions Precautions: Knee Precaution Comments: educated with proper positioning of knee and precautions . Restrictions Weight Bearing Restrictions: No RLE Weight Bearing: Weight bearing as tolerated     Mobility  Bed Mobility Overal bed mobility: Needs Assistance Bed Mobility: Supine to Sit     Supine to sit: Min assist;HOB elevated     General bed mobility comments: assist and cues for sequencing , assist with LE. increased time.    Transfers Overall transfer level: Needs assistance Equipment used:  Rolling walker (2 wheels) Transfers: Sit to/from Stand Sit to Stand: Min assist;From elevated surface           General transfer comment: Assist with a little boost from surface. Cues for safety, technique, hand/LE placement.  Increased difficulty due to BMI.    Ambulation/Gait Ambulation/Gait assistance: Min assist Gait Distance (Feet): 7 Feet Assistive device: Rolling walker (2 wheels) Gait Pattern/deviations: Step-to pattern;Trunk flexed;Antalgic Gait velocity: decreased   General Gait Details: decreased amb distance due to increased c/o nausea.  Recliner pulled to pt.   Stairs             Wheelchair Mobility    Modified Rankin (Stroke Patients Only)       Balance                                            Cognition Arousal/Alertness: Awake/alert Behavior During Therapy: WFL for tasks assessed/performed Overall Cognitive Status: Within Functional Limits for tasks assessed                                 General Comments: AxO x 3 pleasant        Exercises      General Comments        Pertinent Vitals/Pain Pain Assessment: 0-10 Pain Score: 5  Pain Location: R knee and thigh area Pain Descriptors / Indicators: Discomfort;Sore;Grimacing;Guarding;Aching Pain Intervention(s): Monitored during session;Repositioned;Ice applied;Premedicated before session  Home Living                          Prior Function            PT Goals (current goals can now be found in the care plan section) Progress towards PT goals: Progressing toward goals    Frequency    7X/week      PT Plan Current plan remains appropriate    Co-evaluation              AM-PAC PT "6 Clicks" Mobility   Outcome Measure  Help needed turning from your back to your side while in a flat bed without using bedrails?: A Little Help needed moving from lying on your back to sitting on the side of a flat bed without using bedrails?:  A Little Help needed moving to and from a bed to a chair (including a wheelchair)?: A Little Help needed standing up from a chair using your arms (e.g., wheelchair or bedside chair)?: A Little Help needed to walk in hospital room?: A Lot Help needed climbing 3-5 steps with a railing? : A Lot 6 Click Score: 16    End of Session Equipment Utilized During Treatment: Gait belt Activity Tolerance: Other (comment) (nausea) Patient left: in chair;with call bell/phone within reach;with chair alarm set Nurse Communication: Mobility status PT Visit Diagnosis: Other abnormalities of gait and mobility (R26.89);Pain Pain - Right/Left: Right Pain - part of body: Knee     Time: 9379-0240 PT Time Calculation (min) (ACUTE ONLY): 27 min  Charges:  $Gait Training: 8-22 mins $Therapeutic Activity: 8-22 mins                     Felecia Shelling  PTA Acute  Rehabilitation Services Pager      [phone] Office      [phone]

## 2020-11-25 NOTE — Plan of Care (Signed)
  Problem: Activity: Goal: Ability to avoid complications of mobility impairment will improve Outcome: Progressing Goal: Range of joint motion will improve Outcome: Progressing   Problem: Pain Management: Goal: Pain level will decrease with appropriate interventions Outcome: Progressing   

## 2020-11-26 ENCOUNTER — Other Ambulatory Visit: Payer: Self-pay | Admitting: Family Medicine

## 2020-11-26 ENCOUNTER — Telehealth: Payer: Medicare Other

## 2020-11-26 DIAGNOSIS — Z79890 Hormone replacement therapy (postmenopausal): Secondary | ICD-10-CM | POA: Diagnosis not present

## 2020-11-26 DIAGNOSIS — Z96652 Presence of left artificial knee joint: Secondary | ICD-10-CM | POA: Diagnosis present

## 2020-11-26 DIAGNOSIS — I447 Left bundle-branch block, unspecified: Secondary | ICD-10-CM | POA: Diagnosis present

## 2020-11-26 DIAGNOSIS — D62 Acute posthemorrhagic anemia: Secondary | ICD-10-CM | POA: Diagnosis not present

## 2020-11-26 DIAGNOSIS — Z20822 Contact with and (suspected) exposure to covid-19: Secondary | ICD-10-CM | POA: Diagnosis present

## 2020-11-26 DIAGNOSIS — Z8249 Family history of ischemic heart disease and other diseases of the circulatory system: Secondary | ICD-10-CM | POA: Diagnosis not present

## 2020-11-26 DIAGNOSIS — Z6831 Body mass index (BMI) 31.0-31.9, adult: Secondary | ICD-10-CM | POA: Diagnosis not present

## 2020-11-26 DIAGNOSIS — E785 Hyperlipidemia, unspecified: Secondary | ICD-10-CM | POA: Diagnosis present

## 2020-11-26 DIAGNOSIS — Z79899 Other long term (current) drug therapy: Secondary | ICD-10-CM | POA: Diagnosis not present

## 2020-11-26 DIAGNOSIS — Z833 Family history of diabetes mellitus: Secondary | ICD-10-CM | POA: Diagnosis not present

## 2020-11-26 DIAGNOSIS — M1711 Unilateral primary osteoarthritis, right knee: Secondary | ICD-10-CM | POA: Diagnosis present

## 2020-11-26 DIAGNOSIS — E890 Postprocedural hypothyroidism: Secondary | ICD-10-CM | POA: Diagnosis present

## 2020-11-26 DIAGNOSIS — K219 Gastro-esophageal reflux disease without esophagitis: Secondary | ICD-10-CM | POA: Diagnosis present

## 2020-11-26 DIAGNOSIS — M810 Age-related osteoporosis without current pathological fracture: Secondary | ICD-10-CM | POA: Diagnosis present

## 2020-11-26 DIAGNOSIS — E669 Obesity, unspecified: Secondary | ICD-10-CM | POA: Diagnosis present

## 2020-11-26 LAB — CBC
HCT: 23.5 % — ABNORMAL LOW (ref 36.0–46.0)
Hemoglobin: 8 g/dL — ABNORMAL LOW (ref 12.0–15.0)
MCH: 31.5 pg (ref 26.0–34.0)
MCHC: 34 g/dL (ref 30.0–36.0)
MCV: 92.5 fL (ref 80.0–100.0)
Platelets: 160 10*3/uL (ref 150–400)
RBC: 2.54 MIL/uL — ABNORMAL LOW (ref 3.87–5.11)
RDW: 13.8 % (ref 11.5–15.5)
WBC: 12.1 10*3/uL — ABNORMAL HIGH (ref 4.0–10.5)
nRBC: 0 % (ref 0.0–0.2)

## 2020-11-26 LAB — PREPARE RBC (CROSSMATCH)

## 2020-11-26 MED ORDER — ENSURE ENLIVE PO LIQD
237.0000 mL | Freq: Two times a day (BID) | ORAL | Status: DC
  Administered 2020-11-27 – 2020-11-29 (×6): 237 mL via ORAL

## 2020-11-26 MED ORDER — SODIUM CHLORIDE 0.9% IV SOLUTION
Freq: Once | INTRAVENOUS | Status: AC
Start: 2020-11-26 — End: 2020-11-26

## 2020-11-26 NOTE — Plan of Care (Signed)
  Problem: Activity: Goal: Ability to avoid complications of mobility impairment will improve Outcome: Progressing Goal: Range of joint motion will improve Outcome: Progressing   Problem: Pain Management: Goal: Pain level will decrease with appropriate interventions Outcome: Progressing   

## 2020-11-26 NOTE — Telephone Encounter (Signed)
UTD on visits, please escribe. Thank you! 

## 2020-11-26 NOTE — Progress Notes (Signed)
Physical Therapy Treatment Patient Details Name: Tonya Johns MRN: [id number] DOB: 01-19-1931 Today's Date: 11/26/2020   History of Present Illness pt is 39 yr admit s/p R TKA , with PMH of LTKA in 2018, RA, depression, DM, fibromyalogia, HTN, and back surgery    PT Comments    POD # 3 pm session Pt received one unit of blood.  Assisted OOB to [facility] was difficult.  General bed mobility comments: required increased time and Mod Assist to come to complete EOB while supporting R LE.  General transfer comment: pt required increased assist + 2 side by side to rise from elevated bed to [facility] then even greater assist from lower level BSC with 50% VC's on proper hand placement and safety with turn completion as pt tends to sit to early.General Gait Details: Required + 2 asisst for safety such that recliner was following as pt was only able to amb 18 feet due to MAX c/o fatigue.  Pt also c/o B UE weakness with difficulty fully supporting self upright.  Daughter present and observed session as well as assisted by following with recliner.  Pt will need another PT session as she has NOT met mobility goals to safely D/C to home.   Recommendations for follow up therapy are one component of a multi-disciplinary discharge planning process, led by the attending physician.  Recommendations may be updated based on patient status, additional functional criteria and insurance authorization.  Follow Up Recommendations  Follow physician's recommendations for discharge plan and follow up therapies     Assistance Recommended at Discharge Frequent or constant Supervision/Assistance  Equipment Recommendations  None recommended by PT    Recommendations for Other Services       Precautions / Restrictions Precautions Precautions: Knee Precaution Comments: educated with proper positioning of knee and precautions . Restrictions Weight Bearing Restrictions: No RLE Weight Bearing: Weight bearing as tolerated      Mobility  Bed Mobility Overal bed mobility: Needs Assistance Bed Mobility: Supine to Sit     Supine to sit: Mod assist     General bed mobility comments: required increased time and Mod Assist to come to complete EOB while supporting R LE    Transfers Overall transfer level: Needs assistance Equipment used: Rolling walker (2 wheels) Transfers: Sit to/from [employer] Transfers Sit to Stand: Mod assist;+2 physical assistance;+2 safety/equipment Stand pivot transfers: Mod assist;Max assist         General transfer comment: pt required increased assist + 2 side by side to rise from elevated bed to [facility] then even greater assist from lower level BSC with 50% VC's on proper hand placement and safety with turn completion as pt tends to sit to early.    Ambulation/Gait Ambulation/Gait assistance: Min assist;+2 physical assistance;+2 safety/equipment Gait Distance (Feet): 18 Feet Assistive device: Rolling walker (2 wheels) Gait Pattern/deviations: Step-to pattern;Trunk flexed;Antalgic Gait velocity: decreased   General Gait Details: Required + 2 asisst for safety such that recliner was following as pt was only able to amb 18 feet due to MAX c/o fatigue.  Pt also c/o B UE weakness with difficulty fully supporting self upright.   Stairs             Wheelchair Mobility    Modified Rankin (Stroke Patients Only)       Balance  Cognition Arousal/Alertness: Awake/alert Behavior During Therapy: WFL for tasks assessed/performed Overall Cognitive Status: Within Functional Limits for tasks assessed                                 General Comments: AxO x 3 pleasant        Exercises      General Comments        Pertinent Vitals/Pain Pain Assessment: 0-10 Pain Score: 3  Pain Location: R knee and thigh area Pain Descriptors / Indicators: Discomfort;Sore;Grimacing;Guarding;Aching Pain  Intervention(s): Monitored during session;Premedicated before session;Repositioned    Home Living                          Prior Function            PT Goals (current goals can now be found in the care plan section) Progress towards PT goals: Progressing toward goals    Frequency    7X/week      PT Plan Current plan remains appropriate    Co-evaluation              AM-PAC PT "6 Clicks" Mobility   Outcome Measure  Help needed turning from your back to your side while in a flat bed without using bedrails?: A Lot Help needed moving from lying on your back to sitting on the side of a flat bed without using bedrails?: A Lot Help needed moving to and from a bed to a chair (including a wheelchair)?: A Lot Help needed standing up from a chair using your arms (e.g., wheelchair or bedside chair)?: A Lot Help needed to walk in hospital room?: A Lot Help needed climbing 3-5 steps with a railing? : Total 6 Click Score: 11    End of Session Equipment Utilized During Treatment: Gait belt Activity Tolerance: Patient limited by fatigue Patient left: in chair;with call bell/phone within reach;with chair alarm set Nurse Communication: Mobility status (pt will need another PT session as currently pt has NOT met goals to safely D/C to home with daughter.) PT Visit Diagnosis: Other abnormalities of gait and mobility (R26.89);Pain Pain - Right/Left: Right Pain - part of body: Knee     Time: 8828-0034 PT Time Calculation (min) (ACUTE ONLY): 28 min  Charges:  $Gait Training: 8-22 mins $Therapeutic Activity: 8-22 mins                     {Kodey Xue  PTA Acute  Rehabilitation Services Pager      [phone] Office      [phone]

## 2020-11-26 NOTE — Progress Notes (Signed)
PATIENT ID: Tonya Johns  MRN: [id number]  DOB/AGE:  1930-03-12 / 87 y.o.  3 Days Post-Op Procedure(s) (LRB): RIGHT TOTAL KNEE ARTHROPLASTY (Right)    PROGRESS NOTE Subjective: Patient is alert, oriented, no Nausea, no Vomiting, yes passing gas. Taking PO well. Denies SOB, Chest or Calf Pain. Using Incentive Spirometer, PAS in place. Ambulate WBAT with pt walking 7 ft with therapy, Patient reports pain as moderate .    Objective: Vital signs in last 24 hours: Vitals:   11/25/20 0449 11/25/20 1443 11/25/20 2125 11/26/20 0441  BP: (!) 161/63 (!) 143/71 (!) 157/67 (!) 153/59  Pulse: 75 74 87 85  Resp: 18 18 16 16   Temp: 98.8 F (37.1 C) 98.9 F (37.2 C) 100.1 F (37.8 C) 98.7 F (37.1 C)  TempSrc: Oral  Oral Oral  SpO2: 95% 98% 96% 97%  Weight:      Height:          Intake/Output from previous day: I/O last 3 completed shifts: In: 600 [P.O.:600] Out: -    Intake/Output this shift: No intake/output data recorded.   LABORATORY DATA: Recent Labs    11/24/20 0331 11/25/20 0323 11/26/20 0338  WBC 13.1* 13.1* 12.1*  HGB 9.9* 8.5* 8.0*  HCT 30.9* 26.3* 23.5*  PLT 195 181 160  NA 135  --   --   K 4.7  --   --   CL 106  --   --   CO2 24  --   --   BUN 15  --   --   CREATININE 1.17*  --   --   GLUCOSE 226*  --   --   CALCIUM 8.8*  --   --     Examination: Neurologically intact Neurovascular intact Sensation intact distally Intact pulses distally Dorsiflexion/Plantar flexion intact Incision: dressing C/D/I No cellulitis present Compartment soft}  Assessment:   3 Days Post-Op Procedure(s) (LRB): RIGHT TOTAL KNEE ARTHROPLASTY (Right) ADDITIONAL DIAGNOSIS: Expected Acute Blood Loss Anemia,obesity, LBBB, hypothyroid  Anticipated LOS equal to or greater than 2 midnights due to - Age 68 and older with one or more of the following:  - Obesity  - Expected need for hospital services (PT, OT, Nursing) required for safe  discharge  - Anticipated need for postoperative  skilled nursing care or inpatient rehab  - Active co-morbidities: Anemia OR   - Unanticipated findings during/Post Surgery: Slow post-op progression: GI, pain control, mobility    Plan: PT/OT WBAT, AROM and PROM  DVT Prophylaxis:  SCDx72hrs, ASA 81 mg BID x 2 weeks DISCHARGE PLAN: Home, when pt passes therapy goals DISCHARGE NEEDS: HHPT, Walker, and 3-in-1 comode seat   Plan to give 1 unit of PRBC'S as the pt has symptomatic anemia.  Pt was unable to perform therapy yesterday due to fatigue.  76 11/26/2020, 8:34 AM

## 2020-11-26 NOTE — Progress Notes (Signed)
Physical Therapy Treatment Patient Details Name: Tonya Johns MRN: [id number] DOB: 1930/10/22 Today's Date: 11/26/2020   History of Present Illness pt is 66 yr admit s/p R TKA , with PMH of LTKA in 2018, RA, depression, DM, fibromyalogia, HTN, and back surgery    PT Comments    POD # 3 pm session Assisted with amb with daughter.  General transfer comment: with daughter assisted with amb in hallway with instructions on "hands on" safe handling and esp safety with turn completion prior to sit as pt tends to reach beyong her center to sit before completing turn.  Also instructed on advancing R LE prior to sit to decrease stress/pain knee flexion. General Gait Details: still requiring + 2 assist.  Very slow sluggish gait with difficulty achieving upright posture.  Increased c/o B UE weakness/fatigue.  Did apply pt's shoes this session.  Daughter "hands on" assisted with gait while Therapist closely followed with recliner.  Pt present with impaired balance esp with turns and back gait to recliner requiring increased assist and VC's to prevent LOB. Daughter DOES NOT feel comfortable taking pt home today and pt has NOT met Therapy goals.  Have yet to practice stairs.   Recommendations for follow up therapy are one component of a multi-disciplinary discharge planning process, led by the attending physician.  Recommendations may be updated based on patient status, additional functional criteria and insurance authorization.  Follow Up Recommendations  Home health PT     Assistance Recommended at Discharge Frequent or constant Supervision/Assistance  Equipment Recommendations  None recommended by PT    Recommendations for Other Services       Precautions / Restrictions Precautions Precautions: Knee Precaution Comments: educated with proper positioning of knee and precautions . Restrictions Weight Bearing Restrictions: No RLE Weight Bearing: Weight bearing as tolerated     Mobility  Bed  Mobility Overal bed mobility: Needs Assistance Bed Mobility: Supine to Sit     Supine to sit: Mod assist     General bed mobility comments: OOB in recliner    Transfers Overall transfer level: Needs assistance Equipment used: Rolling walker (2 wheels) Transfers: Sit to/from [employer] Transfers Sit to Stand: Mod assist;Min assist;+2 safety/equipment Stand pivot transfers: Mod assist;+2 safety/equipment         General transfer comment: with daughter assisted with amb in hallway with instructions on "hands on" safe handling and esp safety with turn completion prior to sit as pt tends to reach beyong her center to sit before completing turn.  Also instructed on advancing R LE prior to sit to decrease stress/pain knee flexion.    Ambulation/Gait Ambulation/Gait assistance: Min assist;+2 physical assistance;+2 safety/equipment Gait Distance (Feet): 24 Feet (12 feet x 2 in hallway requiring one seated rest break) Assistive device: Rolling walker (2 wheels) Gait Pattern/deviations: Step-to pattern;Trunk flexed;Antalgic Gait velocity: decreased   General Gait Details: still requiring + 2 assist.  Very slow sluggish gait with difficulty achieving upright posture.  Increased c/o B UE weakness/fatigue.  Did apply pt's shoes this session.  Daughter "hands on" assisted with gait while Therapist closely followed with recliner.  Pt present with impaired balance esp with turns and back gait to recliner requiring increased assist and VC's to prevent LOB.   Stairs             Wheelchair Mobility    Modified Rankin (Stroke Patients Only)       Balance  Cognition Arousal/Alertness: Awake/alert Behavior During Therapy: WFL for tasks assessed/performed Overall Cognitive Status: Within Functional Limits for tasks assessed                                 General Comments: AxO x 3 pleasant         Exercises      General Comments        Pertinent Vitals/Pain Pain Assessment: 0-10 Pain Score: 6  Pain Location: R knee Pain Descriptors / Indicators: Discomfort;Sore;Grimacing;Guarding;Aching Pain Intervention(s): Monitored during session;Premedicated before session;Repositioned;Ice applied    Home Living                          Prior Function            PT Goals (current goals can now be found in the care plan section) Progress towards PT goals: Progressing toward goals    Frequency    7X/week      PT Plan Current plan remains appropriate    Co-evaluation              AM-PAC PT "6 Clicks" Mobility   Outcome Measure  Help needed turning from your back to your side while in a flat bed without using bedrails?: A Lot Help needed moving from lying on your back to sitting on the side of a flat bed without using bedrails?: A Lot Help needed moving to and from a bed to a chair (including a wheelchair)?: A Lot Help needed standing up from a chair using your arms (e.g., wheelchair or bedside chair)?: A Lot Help needed to walk in hospital room?: A Lot Help needed climbing 3-5 steps with a railing? : Total 6 Click Score: 11    End of Session Equipment Utilized During Treatment: Gait belt Activity Tolerance: Patient limited by fatigue Patient left: in chair;with call bell/phone within reach;with chair alarm set Nurse Communication: Mobility status PT Visit Diagnosis: Other abnormalities of gait and mobility (R26.89);Pain Pain - Right/Left: Right Pain - part of body: Knee     Time: 7897-8478 PT Time Calculation (min) (ACUTE ONLY): 31 min  Charges:  $Gait Training: 8-22 mins $Therapeutic Activity: 8-22 mins                     Rica Koyanagi  PTA Acute  Rehabilitation Services Pager      [phone] Office      [phone]

## 2020-11-27 LAB — BPAM RBC
Blood Product Expiration Date: 202211272359
ISSUE DATE / TIME: 202210271002
Unit Type and Rh: 5100

## 2020-11-27 LAB — TYPE AND SCREEN
ABO/RH(D): O POS
Antibody Screen: NEGATIVE
Unit division: 0

## 2020-11-27 NOTE — Progress Notes (Signed)
Physical Therapy Treatment Patient Details Name: Tonya Johns MRN: [id number] DOB: Sep 30, 1930 Today's Date: 11/27/2020   History of Present Illness pt is 89 yr admit s/p R TKA , with PMH of LTKA in 2018, RA, depression, DM, fibromyalogia, HTN, and back surgery    PT Comments    POD # 4 am session General Comments: AxO x 3 but groggy, slow and present with inpaired safety cognition requiring repeat VC's (meds?) General bed mobility comments: required increased assist and increased time to transition to EOB.  Slow/Groggy.  General Gait Details: VERY slow, sluggish gait requiring increased time and present with impaired cognition (OXY Meds?)  Daughter also reported pt was confused last night "reaching for things" and "trying to get out of bed".  Decreased amb distance to and from bathroom only this session as pt became incont urine just prior to getting to toilet.  Max assist for hygiene.General Gait Details: VERY slow, sluggish gait requiring increased time and present with impaired cognition (OXY Meds?)  Daughter also reported pt was confused last night "reaching for things" and "trying to get out of bed".  Decreased amb distance to and from bathroom only this session as pt became incont urine just prior to getting to toilet.  Max assist for hygiene. Pt progressing very slowly nad daughter is unable to physically assist pt at current level.   Pt will need SNF at this point.  Will consult LPT.   Recommendations for follow up therapy are one component of a multi-disciplinary discharge planning process, led by the attending physician.  Recommendations may be updated based on patient status, additional functional criteria and insurance authorization.  Follow Up Recommendations  Skilled nursing-short term rehab (<3 hours/day)     Assistance Recommended at Discharge Frequent or constant Supervision/Assistance  Equipment Recommendations       Recommendations for Other Services       Precautions /  Restrictions Precautions Precautions: Knee Precaution Comments: educated with proper positioning of knee and precautions . Restrictions Weight Bearing Restrictions: No RLE Weight Bearing: Weight bearing as tolerated     Mobility  Bed Mobility Overal bed mobility: Needs Assistance Bed Mobility: Supine to Sit     Supine to sit: Mod assist     General bed mobility comments: required increased assist and increased time to transition to EOB.  Slow/Groggy    Transfers Overall transfer level: Needs assistance Equipment used: Rolling walker (2 wheels) Transfers: Sit to/from [employer] Sit to Stand: Mod assist Stand pivot transfers: Mod assist;Max assist         General transfer comment: required increased time and repeat VC's pt moving slwoly,groggy.  Greater difficulty rising from toilet.  Poaterior LOB.  Poor ability to perform self peri care and maintain a safe static stance.  HIGH FALL RISK.    Ambulation/Gait Ambulation/Gait assistance: Min assist Gait Distance (Feet): 16 Feet Assistive device: Rolling walker (2 wheels) Gait Pattern/deviations: Step-to pattern;Trunk flexed;Antalgic Gait velocity: decreased   General Gait Details: VERY slow, sluggish gait requiring increased time and present with impaired cognition (OXY Meds?)  Daughter also reported pt was confused last night "reaching for things" and "trying to get out of bed".  Decreased amb distance to and from bathroom only this session as pt became incont urine just prior to getting to toilet.  Max assist for hygiene.   Stairs             Wheelchair Mobility    Modified Rankin (Stroke Patients Only)  Balance                                            Cognition Arousal/Alertness: Awake/alert   Overall Cognitive Status: Impaired/Different from baseline                                 General Comments: AxO x 3 but groggy, slow and present with  inpaired safety cognition requiring repeat VC's (meds?)        Exercises      General Comments        Pertinent Vitals/Pain Pain Assessment: 0-10 Pain Score: 6  Pain Location: R knee Pain Descriptors / Indicators: Discomfort;Sore;Grimacing;Guarding;Aching Pain Intervention(s): Monitored during session;Repositioned;Premedicated before session;Ice applied    Home Living                          Prior Function            PT Goals (current goals can now be found in the care plan section)      Frequency    7X/week      PT Plan Current plan remains appropriate    Co-evaluation              AM-PAC PT "6 Clicks" Mobility   Outcome Measure  Help needed turning from your back to your side while in a flat bed without using bedrails?: A Lot Help needed moving from lying on your back to sitting on the side of a flat bed without using bedrails?: A Lot Help needed moving to and from a bed to a chair (including a wheelchair)?: A Lot Help needed standing up from a chair using your arms (e.g., wheelchair or bedside chair)?: A Lot Help needed to walk in hospital room?: A Lot Help needed climbing 3-5 steps with a railing? : Total 6 Click Score: 11    End of Session Equipment Utilized During Treatment: Gait belt Activity Tolerance: Patient limited by fatigue Patient left: in chair;with call bell/phone within reach;with chair alarm set Nurse Communication: Mobility status PT Visit Diagnosis: Other abnormalities of gait and mobility (R26.89);Pain Pain - Right/Left: Right Pain - part of body: Knee     Time: 0935-1005 PT Time Calculation (min) (ACUTE ONLY): 30 min  Charges:  $Gait Training: 8-22 mins $Therapeutic Activity: 8-22 mins                     {Alma Mohiuddin  PTA Acute  Rehabilitation Services Pager      [phone] Office      [phone]

## 2020-11-27 NOTE — Care Management Important Message (Signed)
Medicare IM printed for [facility] Social Work to give to the patient. 

## 2020-11-27 NOTE — Discharge Summary (Addendum)
Patient ID: Tonya Johns MRN: [id number] DOB/AGE: February 03, 1930 87 y.o.  Admit date: 11/23/2020 Discharge date: 11/30/2020  Admission Diagnoses:  Principal Problem:   Osteoarthritis of right knee Active Problems:   Arthritis of right knee   Discharge Diagnoses:  Same  Past Medical History:  Diagnosis Date   AI (aortic insufficiency)    mild-moderate AI   Anemia    Arthritis    Diverticulitis    GERD (gastroesophageal reflux disease)    Hyperlipidemia    Hypothyroidism    Leg swelling    Osteoporosis     Surgeries: Procedure(s): RIGHT TOTAL KNEE ARTHROPLASTY on 11/23/2020   Consultants:   Discharged Condition: Improved  Hospital Course: Tonya Johns is an 87 y.o. female who was admitted 11/23/2020 for operative treatment ofOsteoarthritis of right knee. Patient has severe unremitting pain that affects sleep, daily activities, and work/hobbies. After pre-op clearance the patient was taken to the operating room on 11/23/2020 and underwent  Procedure(s): RIGHT TOTAL KNEE ARTHROPLASTY.    Patient was given perioperative antibiotics:  Anti-infectives (From admission, onward)    Start     Dose/Rate Route Frequency Ordered Stop   11/23/20 0915  vancomycin (VANCOCIN) IVPB 1000 mg/200 mL premix        1,000 mg 200 mL/hr over 60 Minutes Intravenous On call to O.R. 11/23/20 0901 11/23/20 1238        Patient was given sequential compression devices, early ambulation, and chemoprophylaxis to prevent DVT.  Patient benefited maximally from hospital stay and there were no complications.    Recent vital signs: Patient Vitals for the past 24 hrs:  BP Temp Temp src Pulse Resp SpO2  11/30/20 0641 (!) 188/75 98.3 F (36.8 C) -- 67 16 97 %  11/29/20 2306 (!) 187/78 98.6 F (37 C) Oral 67 16 98 %  11/29/20 1532 (!) 142/71 97.7 F (36.5 C) Oral 73 -- 100 %     Recent laboratory studies: No results for input(s): WBC, HGB, HCT, PLT, NA, K, CL, CO2, BUN, CREATININE, GLUCOSE, INR, CALCIUM  in the last 72 hours.  Invalid input(s): PT, 2   Discharge Medications:   Allergies as of 11/30/2020       Reactions   Penicillins Rash   Did it involve swelling of the face/tongue/throat, SOB, or low BP? No Did it involve sudden or severe rash/hives, skin peeling, or any reaction on the inside of your mouth or nose? Yes Did you need to seek medical attention at a hospital or doctor's office? No When did it last happen?      approx 2005 If all above answers are "NO", may proceed with cephalosporin use.        Medication List     STOP taking these medications    acetaminophen 500 MG tablet Commonly known as: TYLENOL       TAKE these medications    aspirin EC 81 MG tablet Take 1 tablet (81 mg total) by mouth 2 (two) times daily.   Biofreeze Roll-On 4 % Gel Generic drug: Menthol (Topical Analgesic) Apply 1 application topically daily as needed (pain).   Blue-Emu Maximum Strength 2.5 % Liqd Generic drug: Menthol (Topical Analgesic) Apply 1 application topically daily as needed (pain).   Co Q10 100 MG Caps Take 100 mg by mouth at bedtime.   famotidine 20 MG tablet Commonly known as: PEPCID Take 20 mg by mouth daily.   FLUoxetine 10 MG capsule Commonly known as: PROZAC TAKE 1 CAPSULE BY MOUTH  DAILY  levothyroxine 100 MCG tablet Commonly known as: SYNTHROID TAKE 1 TABLET BY MOUTH  DAILY   multivitamin with minerals Tabs tablet Take 1 tablet by mouth at bedtime.   oxyCODONE-acetaminophen 5-325 MG tablet Commonly known as: PERCOCET/ROXICET Take 1 tablet by mouth every 4 (four) hours as needed for severe pain.   pantoprazole 40 MG tablet Commonly known as: PROTONIX Take 1 tablet (40 mg total) by mouth daily.   Pfizer COVID-19 Vac Bivalent injection Generic drug: COVID-19 mRNA bivalent vaccine Proofreader) Inject into the muscle.   pravastatin 80 MG tablet Commonly known as: PRAVACHOL TAKE 1 TABLET BY MOUTH  DAILY   Salonpas 3.02-06-08 % Ptch Generic  drug: Camphor-Menthol-Methyl Sal Apply 1 patch topically daily as needed (pain).   sodium chloride 0.65 % Soln nasal spray Commonly known as: OCEAN Place 2 sprays into both nostrils as needed for congestion.   tiZANidine 2 MG tablet Commonly known as: ZANAFLEX Take 1 tablet (2 mg total) by mouth every 6 (six) hours as needed.               Durable Medical Equipment  (From admission, onward)           Start     Ordered   11/23/20 1649  DME Walker rolling  Once       Question:  Patient needs a walker to treat with the following condition  Answer:  Status post right knee replacement   11/23/20 1648   11/23/20 1649  DME 3 n 1  Once        11/23/20 1648              Discharge Care Instructions  (From admission, onward)           Start     Ordered   11/30/20 0000  Weight bearing as tolerated        11/30/20 1233   11/27/20 0000  Weight bearing as tolerated        11/27/20 0650   11/25/20 0000  Weight bearing as tolerated        11/25/20 0814            Diagnostic Studies: DG Chest 2 View  Result Date: 11/20/2020 CLINICAL DATA:  87 year old female with preoperative chest x-ray EXAM: CHEST - 2 VIEW COMPARISON:  01/02/2017 FINDINGS: Cardiomediastinal silhouette unchanged in size and contour. No evidence of central vascular congestion. No interlobular septal thickening. No pneumothorax or pleural effusion. Coarsened interstitial markings, with no confluent airspace disease. No acute displaced fracture. Degenerative changes of the spine. IMPRESSION: No active cardiopulmonary disease. Electronically Signed   By: Gilmer Mor D.O.   On: 11/20/2020 14:45    Disposition: Discharge disposition: 01-Home or Self Care       Discharge Instructions     Call MD / Call 911   Complete by: As directed    If you experience chest pain or shortness of breath, CALL 911 and be transported to the hospital emergency room.  If you develope a fever above 101 F, pus (white  drainage) or increased drainage or redness at the wound, or calf pain, call your surgeon's office.   Call MD / Call 911   Complete by: As directed    If you experience chest pain or shortness of breath, CALL 911 and be transported to the hospital emergency room.  If you develope a fever above 101 F, pus (white drainage) or increased drainage or redness at the wound, or calf pain, call your  surgeon's office.   Call MD / Call 911   Complete by: As directed    If you experience chest pain or shortness of breath, CALL 911 and be transported to the hospital emergency room.  If you develope a fever above 101 F, pus (white drainage) or increased drainage or redness at the wound, or calf pain, call your surgeon's office.   Constipation Prevention   Complete by: As directed    Drink plenty of fluids.  Prune juice may be helpful.  You may use a stool softener, such as Colace (over the counter) 100 mg twice a day.  Use MiraLax (over the counter) for constipation as needed.   Constipation Prevention   Complete by: As directed    Drink plenty of fluids.  Prune juice may be helpful.  You may use a stool softener, such as Colace (over the counter) 100 mg twice a day.  Use MiraLax (over the counter) for constipation as needed.   Constipation Prevention   Complete by: As directed    Drink plenty of fluids.  Prune juice may be helpful.  You may use a stool softener, such as Colace (over the counter) 100 mg twice a day.  Use MiraLax (over the counter) for constipation as needed.   Diet - low sodium heart healthy   Complete by: As directed    Driving restrictions   Complete by: As directed    No driving for 2 weeks   Driving restrictions   Complete by: As directed    No driving for 2 weeks   Driving restrictions   Complete by: As directed    No driving for 2 weeks   Increase activity slowly as tolerated   Complete by: As directed    Increase activity slowly as tolerated   Complete by: As directed     Increase activity slowly as tolerated   Complete by: As directed    Patient may shower   Complete by: As directed    You may shower without a dressing once there is no drainage.  Do not wash over the wound.  If drainage remains, cover wound with plastic wrap and then shower.   Patient may shower   Complete by: As directed    You may shower without a dressing once there is no drainage.  Do not wash over the wound.  If drainage remains, cover wound with plastic wrap and then shower.   Patient may shower   Complete by: As directed    You may shower without a dressing once there is no drainage.  Do not wash over the wound.  If drainage remains, cover wound with plastic wrap and then shower.   Post-operative opioid taper instructions:   Complete by: As directed    POST-OPERATIVE OPIOID TAPER INSTRUCTIONS: It is important to wean off of your opioid medication as soon as possible. If you do not need pain medication after your surgery it is ok to stop day one. Opioids include: Codeine, Hydrocodone(Norco, Vicodin), Oxycodone(Percocet, oxycontin) and hydromorphone amongst others.  Long term and even short term use of opiods can cause: Increased pain response Dependence Constipation Depression Respiratory depression And more.  Withdrawal symptoms can include Flu like symptoms Nausea, vomiting And more Techniques to manage these symptoms Hydrate well Eat regular healthy meals Stay active Use relaxation techniques(deep breathing, meditating, yoga) Do Not substitute Alcohol to help with tapering If you have been on opioids for less than two weeks and do not have pain than  it is ok to stop all together.  Plan to wean off of opioids This plan should start within one week post op of your joint replacement. Maintain the same interval or time between taking each dose and first decrease the dose.  Cut the total daily intake of opioids by one tablet each day Next start to increase the time between  doses. The last dose that should be eliminated is the evening dose.      Post-operative opioid taper instructions:   Complete by: As directed    POST-OPERATIVE OPIOID TAPER INSTRUCTIONS: It is important to wean off of your opioid medication as soon as possible. If you do not need pain medication after your surgery it is ok to stop day one. Opioids include: Codeine, Hydrocodone(Norco, Vicodin), Oxycodone(Percocet, oxycontin) and hydromorphone amongst others.  Long term and even short term use of opiods can cause: Increased pain response Dependence Constipation Depression Respiratory depression And more.  Withdrawal symptoms can include Flu like symptoms Nausea, vomiting And more Techniques to manage these symptoms Hydrate well Eat regular healthy meals Stay active Use relaxation techniques(deep breathing, meditating, yoga) Do Not substitute Alcohol to help with tapering If you have been on opioids for less than two weeks and do not have pain than it is ok to stop all together.  Plan to wean off of opioids This plan should start within one week post op of your joint replacement. Maintain the same interval or time between taking each dose and first decrease the dose.  Cut the total daily intake of opioids by one tablet each day Next start to increase the time between doses. The last dose that should be eliminated is the evening dose.      Post-operative opioid taper instructions:   Complete by: As directed    POST-OPERATIVE OPIOID TAPER INSTRUCTIONS: It is important to wean off of your opioid medication as soon as possible. If you do not need pain medication after your surgery it is ok to stop day one. Opioids include: Codeine, Hydrocodone(Norco, Vicodin), Oxycodone(Percocet, oxycontin) and hydromorphone amongst others.  Long term and even short term use of opiods can cause: Increased pain response Dependence Constipation Depression Respiratory depression And more.   Withdrawal symptoms can include Flu like symptoms Nausea, vomiting And more Techniques to manage these symptoms Hydrate well Eat regular healthy meals Stay active Use relaxation techniques(deep breathing, meditating, yoga) Do Not substitute Alcohol to help with tapering If you have been on opioids for less than two weeks and do not have pain than it is ok to stop all together.  Plan to wean off of opioids This plan should start within one week post op of your joint replacement. Maintain the same interval or time between taking each dose and first decrease the dose.  Cut the total daily intake of opioids by one tablet each day Next start to increase the time between doses. The last dose that should be eliminated is the evening dose.      Weight bearing as tolerated   Complete by: As directed    Weight bearing as tolerated   Complete by: As directed    Weight bearing as tolerated   Complete by: As directed         Follow-up Information     Gean Birchwood, MD. Go on 12/03/2020.   Specialty: Orthopedic Surgery Why: Your appointment is scheduled for 10:15. Contact information: [address] [place] [address] [phone]         [facility] Follow up.   Specialty: Home Health Services Why: HHPT will provide 6 Home therapy visits prior to your starting Outpatient physical therapy. A bath aide will also visit to assist with bathing Contact information: [address] [place] [address] [phone]         [facility], [place]. Go on 12/03/2020.   Why: Your outpatient physical therapy will start at 11:20. Please go over to the therapy side of the office after your visit with Dr Abbe Amsterdam information: Physical Therapy [address] [place] [address] [phone]                  Signed: Dannielle Burn 11/30/2020, 12:33 PM

## 2020-11-27 NOTE — Plan of Care (Signed)
  Problem: Activity: Goal: Ability to avoid complications of mobility impairment will improve Outcome: Progressing Goal: Range of joint motion will improve Outcome: Progressing   Problem: Pain Management: Goal: Pain level will decrease with appropriate interventions Outcome: Progressing   

## 2020-11-27 NOTE — Progress Notes (Signed)
Physical Therapy Treatment Patient Details Name: Sarenity Ramaker MRN: [id number] DOB: [birth year] Today's Date: 11/27/2020   History of Present Illness pt is 89 yr admit s/p R TKA , with PMH of LTKA in 2018, RA, depression, DM, fibromyalogia, HTN, and back surgery    PT Comments    POD # 4 pm session Both daughters present during session.  All agree pt needs SNF.General Gait Details: still moving slow and sluggish.  Assisted with amb to bathroom then a limited distance in hallway.  Recliner following for safety. Both daughters present. Performed a few TE's AAROM followed by ICE.    Recommendations for follow up therapy are one component of a multi-disciplinary discharge planning process, led by the attending physician.  Recommendations may be updated based on patient status, additional functional criteria and insurance authorization.  Follow Up Recommendations  Skilled nursing-short term rehab (<3 hours/day)     Assistance Recommended at Discharge Frequent or constant Supervision/Assistance  Equipment Recommendations       Recommendations for Other Services       Precautions / Restrictions Precautions Precautions: Knee Precaution Comments: educated with proper positioning of knee and precautions . Restrictions Weight Bearing Restrictions: No RLE Weight Bearing: Weight bearing as tolerated     Mobility  Bed Mobility Overal bed mobility: Needs Assistance Bed Mobility: Supine to Sit     Supine to sit: Mod assist     General bed mobility comments: OOB in recliner    Transfers Overall transfer level: Needs assistance Equipment used: Rolling walker (2 wheels) Transfers: Sit to/from [employer] Transfers Sit to Stand: Mod assist Stand pivot transfers: Mod assist;Max assist         General transfer comment: still moving slowly and requires increased assist esp with toilet transfer and hygiene.    Ambulation/Gait Ambulation/Gait assistance: Min assist Gait  Distance (Feet): 28 Feet Assistive device: Rolling walker (2 wheels) Gait Pattern/deviations: Step-to pattern;Trunk flexed;Antalgic Gait velocity: decreased   General Gait Details: still moving slow and sluggish.  Assisted with amb to bathroom then a limited distance in hallway.  Recliner following for safety. Both daughters present.   Stairs             Wheelchair Mobility    Modified Rankin (Stroke Patients Only)       Balance                                            Cognition Arousal/Alertness: Awake/alert   Overall Cognitive Status: Impaired/Different from baseline                                 General Comments: AxO x 3 but groggy, slow and present with inpaired safety cognition requiring repeat VC's (meds?)        Exercises  10 reps AP, knee presses and HS    General Comments        Pertinent Vitals/Pain Pain Assessment: 0-10 Pain Score: 6  Pain Location: R knee Pain Descriptors / Indicators: Discomfort;Sore;Grimacing;Guarding;Aching Pain Intervention(s): Monitored during session;Repositioned;Premedicated before session;Ice applied    Home Living                          Prior Function            PT Goals (current goals  can now be found in the care plan section)      Frequency    7X/week      PT Plan Current plan remains appropriate    Co-evaluation              AM-PAC PT "6 Clicks" Mobility   Outcome Measure  Help needed turning from your back to your side while in a flat bed without using bedrails?: A Lot Help needed moving from lying on your back to sitting on the side of a flat bed without using bedrails?: A Lot Help needed moving to and from a bed to a chair (including a wheelchair)?: A Lot Help needed standing up from a chair using your arms (e.g., wheelchair or bedside chair)?: A Lot Help needed to walk in hospital room?: A Lot Help needed climbing 3-5 steps with a railing?  : Total 6 Click Score: 11    End of Session Equipment Utilized During Treatment: Gait belt Activity Tolerance: Patient limited by fatigue Patient left: in chair;with call bell/phone within reach;with chair alarm set Nurse Communication: Mobility status PT Visit Diagnosis: Other abnormalities of gait and mobility (R26.89);Pain Pain - Right/Left: Right Pain - part of body: Knee     Time: 6226-3335 PT Time Calculation (min) (ACUTE ONLY): 28 min  Charges:  $Gait Training: 8-22 mins $Therapeutic Exercise: 8-22 mins                      Felecia Shelling  PTA Acute  Rehabilitation Services Pager      [phone] Office      [phone]

## 2020-11-27 NOTE — Progress Notes (Signed)
PATIENT ID: Tonya Johns  MRN: [id number]  DOB/AGE:  10-24-1930 / 87 y.o.  4 Days Post-Op Procedure(s) (LRB): RIGHT TOTAL KNEE ARTHROPLASTY (Right)    PROGRESS NOTE Subjective: Patient is alert, oriented, no Nausea, no Vomiting, yes passing gas. Taking PO well. Denies SOB, Chest or Calf Pain. Using Incentive Spirometer, PAS in place. Ambulate WBAT with pt walking 24 ft, Patient reports pain as mild to moderate .    Objective: Vital signs in last 24 hours: Vitals:   11/26/20 1334 11/26/20 2048 11/26/20 2049 11/27/20 0557  BP: (!) 139/57 (!) 162/70  (!) 160/64  Pulse: 76 85  69  Resp: 16 16  16   Temp: 98 F (36.7 C) 100 F (37.8 C) 99.8 F (37.7 C) 98.3 F (36.8 C)  TempSrc: Oral Oral Oral Oral  SpO2: 96% 96%  94%  Weight:      Height:          Intake/Output from previous day: I/O last 3 completed shifts: In: 1831.3 [P.O.:1300; I.V.:100; Blood:431.3] Out: 250 [Urine:250]   Intake/Output this shift: Total I/O In: 120 [P.O.:120] Out: -    LABORATORY DATA: Recent Labs    11/25/20 0323 11/26/20 0338  WBC 13.1* 12.1*  HGB 8.5* 8.0*  HCT 26.3* 23.5*  PLT 181 160    Examination: Neurologically intact Neurovascular intact Sensation intact distally Intact pulses distally Dorsiflexion/Plantar flexion intact Incision: dressing C/D/I and no drainage No cellulitis present Compartment soft}  Assessment:   4 Days Post-Op Procedure(s) (LRB): RIGHT TOTAL KNEE ARTHROPLASTY (Right) ADDITIONAL DIAGNOSIS: Expected Acute Blood Loss Anemia, obesity, LBBB, hypothyroid  Plan: PT/OT WBAT, AROM and PROM  DVT Prophylaxis:  SCDx72hrs, ASA 81 mg BID x 2 weeks DISCHARGE PLAN: Home DISCHARGE NEEDS: HHPT, Walker, and 3-in-1 comode seat     11/28/20 11/27/2020, 6:48 AM

## 2020-11-27 NOTE — Progress Notes (Signed)
Patient 2+ max assist this shift. Very weak and unable to get from bedside commode to chair this a.m. without 3-person assist.

## 2020-11-28 NOTE — TOC Progression Note (Signed)
Transition of Care [facility]) - Progression Note   Patient Details  Name: Tonya Johns MRN: [id number] Date of Birth: Jan 08, 1931  Transition of Care [facility]) CM/SW [place], LCSW Phone Number: 11/28/2020, 3:57 PM  Clinical Narrative: PA is agreeable to patient being referred for SNF due to limited progress with PT. CSW met with patient, [name]) in person, and [name]) on the phone to discuss recommendations. Patient and family are agreeable to patient being referred out for SNF, but want to see if they can get family to be with her 24/7 at home while [facility] makes SNF referral in case they are able to safely take her home. Patient is vaccinated and boosted x2 for COVID.  FL2 done; PASRR verified. Initial referral faxed out. TOC awaiting bed offers.  Expected Discharge Plan: [place] Barriers to Discharge: SNF Pending bed offer  Expected Discharge Plan and Services Expected Discharge Plan: [place] Expected Discharge Date: 11/27/20               DME Arranged: N/A DME Agency: NA HH Arranged: PT, Nurse's Aide HH Agency: Magazine features editor spoke with at [place] in orthopedist's office  Readmission Risk Interventions No flowsheet data found.

## 2020-11-28 NOTE — Plan of Care (Signed)
°  Problem: Education: °Goal: Knowledge of the prescribed therapeutic regimen will improve °Outcome: Progressing °  °Problem: Clinical Measurements: °Goal: Postoperative complications will be avoided or minimized °Outcome: Progressing °  °Problem: Pain Management: °Goal: Pain level will decrease with appropriate interventions °Outcome: Progressing °  °

## 2020-11-28 NOTE — Progress Notes (Signed)
Physical Therapy Treatment Patient Details Name: Tonya Johns MRN: [id number] DOB: [birth year] Today's Date: 11/28/2020   History of Present Illness pt is 89 yr admit s/p R TKA , with PMH of LTKA in 2018, RA, depression, DM, fibromyalogia, HTN, and back surgery    PT Comments    POD # 5 am session General Comments: AxO x 3 improving now that she is no longer taking OXY.  Motor skills also improving. Pt already OOB in bathroom with NT.  Required assist for hygiene due to incont urine prior to reaching toilet.  Assisted with amb in hallway.  General transfer comment: increased ability to rise but with 50% VC's on proper hand placement and safety with turn completion.  Sloww but not as sluggish as yesterday. General Gait Details: slow but improved with increased gait distance/tolerance.  50% VC's on proper walker to self distance and upright posture.  Tolerated amb 43 feet.  Returned to room in recliner and applied ICE.  Pt too fatigue to perform TE's after.  Recommendations for follow up therapy are one component of a multi-disciplinary discharge planning process, led by the attending physician.  Recommendations may be updated based on patient status, additional functional criteria and insurance authorization.  Follow Up Recommendations  Skilled nursing-short term rehab (<3 hours/day)     Assistance Recommended at Discharge Frequent or constant Supervision/Assistance  Equipment Recommendations  None recommended by PT    Recommendations for Other Services       Precautions / Restrictions Precautions Precautions: Knee Precaution Comments: educated with proper positioning of knee and precautions . Restrictions Weight Bearing Restrictions: No RLE Weight Bearing: Weight bearing as tolerated     Mobility  Bed Mobility               General bed mobility comments: OOB in bathroom    Transfers Overall transfer level: Needs assistance Equipment used: Rolling walker (2  wheels) Transfers: Sit to/from [employer] Transfers Sit to Stand: Mod assist Stand pivot transfers: Mod assist;Max assist         General transfer comment: increased ability to rise but with 50% VC's on proper hand placement and safety with turn completion.  Sloww but not as sluggish as yesterday.    Ambulation/Gait Ambulation/Gait assistance: Min assist Gait Distance (Feet): 43 Feet Assistive device: Rolling walker (2 wheels) Gait Pattern/deviations: Step-to pattern;Trunk flexed;Antalgic Gait velocity: decreased   General Gait Details: slow but improved with increased gait distance/tolerance.  50% VC's on proper walker to self distance and upright posture.  Tolerated amb 43 feet.   Stairs             Wheelchair Mobility    Modified Rankin (Stroke Patients Only)       Balance                                            Cognition Arousal/Alertness: Awake/alert Behavior During Therapy: WFL for tasks assessed/performed Overall Cognitive Status: Impaired/Different from baseline                                 General Comments: AxO x 3 improving now that she is no longer taking OXY.  Motor skills also improving.        Exercises      General Comments  Pertinent Vitals/Pain Pain Assessment: 0-10 Pain Score: 5  Pain Location: R knee Pain Descriptors / Indicators: Discomfort;Sore;Grimacing;Guarding;Aching Pain Intervention(s): Monitored during session;Premedicated before session;Repositioned;Ice applied    Home Living                          Prior Function            PT Goals (current goals can now be found in the care plan section) Progress towards PT goals: Progressing toward goals    Frequency    7X/week      PT Plan Current plan remains appropriate    Co-evaluation              AM-PAC PT "6 Clicks" Mobility   Outcome Measure  Help needed turning from your back to your side  while in a flat bed without using bedrails?: A Lot Help needed moving from lying on your back to sitting on the side of a flat bed without using bedrails?: A Lot Help needed moving to and from a bed to a chair (including a wheelchair)?: A Lot Help needed standing up from a chair using your arms (e.g., wheelchair or bedside chair)?: A Lot Help needed to walk in hospital room?: A Lot Help needed climbing 3-5 steps with a railing? : Total 6 Click Score: 11    End of Session Equipment Utilized During Treatment: Gait belt Activity Tolerance: Patient limited by fatigue Patient left: in chair;with call bell/phone within reach;with chair alarm set;with family/visitor present Nurse Communication: Mobility status PT Visit Diagnosis: Other abnormalities of gait and mobility (R26.89);Pain Pain - Right/Left: Right Pain - part of body: Knee     Time: 1130-1145 PT Time Calculation (min) (ACUTE ONLY): 15 min  Charges:  $Gait Training: 8-22 mins                     Felecia Shelling  PTA Acute  Rehabilitation Services Pager      [phone] Office      [phone]

## 2020-11-28 NOTE — [place] FL2 (Signed)
[place] MEDICAID FL2 LEVEL OF CARE SCREENING TOOL     IDENTIFICATION  Patient Name: Tonya Johns Birthdate: 1930-03-22 Sex: female Admission Date (Current Location): 11/23/2020  [facility] and [place] Number:  Producer, television/film/video and Address:  [facility],  [address] [place], [place] [address]      Provider Number: [id number]  Attending Physician Name and Address:  Gean Birchwood, MD  Relative Name and Phone Number:  Edrick Kins (daughter) Ph: [phone]    Current Level of Care: Hospital Recommended Level of Care: Skilled Nursing Facility Prior Approval Number:    Date Approved/Denied:   PASRR Number: [id number]  Discharge Plan: SNF    Current Diagnoses: Patient Active Problem List   Diagnosis Date Noted   Arthritis of right knee 11/23/2020   Osteoarthritis of right knee 11/19/2020   Incomplete left bundle branch block (LBBB) 10/08/2020   Nonrheumatic aortic valve insufficiency 10/08/2020   Obesity (BMI 30-39.9) 01/07/2019   Abnormal EKG 03/23/2018   Dizziness 03/23/2018   Gait instability 03/23/2018   Bradycardia 03/23/2018   Primary osteoarthritis of left knee 01/09/2017   Degenerative arthritis of left knee 01/05/2017   Elevated blood pressure reading in office without diagnosis of hypertension 03/09/2015   Lumbar back pain 05/22/2014   Uterine prolapse 02/19/2014   Hypothyroidism following radioiodine therapy 07/26/2011   Elevated glucose 04/07/2011   Multinodular goiter 12/10/2010   Knee pain, bilateral 11/25/2010   Depression 11/25/2010   Heart murmur 11/25/2010   Physical exam 07/14/2010   GERD (gastroesophageal reflux disease) 06/15/2010   Hyperlipidemia 06/15/2010    Orientation RESPIRATION BLADDER Height & Weight     Self, Time, Situation, Place  Normal Continent Weight: 182 lb (82.6 kg) Height:  5\' 4"  (162.6 cm)  BEHAVIORAL SYMPTOMS/MOOD NEUROLOGICAL BOWEL NUTRITION STATUS   (N/A)  (N/A) Continent Diet (Regular diet)   AMBULATORY STATUS COMMUNICATION OF NEEDS Skin   Limited Assist Verbally Surgical wounds                       Personal Care Assistance Level of Assistance  Bathing, Feeding, Dressing Bathing Assistance: Limited assistance Feeding assistance: Independent Dressing Assistance: Limited assistance     Functional Limitations Info  Sight, Hearing, Speech Sight Info: Adequate Hearing Info: Adequate Speech Info: Adequate    SPECIAL CARE FACTORS FREQUENCY  OT (By licensed OT), PT (By licensed PT)     PT Frequency: 5x's/week OT Frequency: 5x's/week            Contractures Contractures Info: Not present    Additional Factors Info  Code Status, Allergies, Psychotropic Code Status Info: Full Allergies Info: Penicillins Psychotropic Info: Prozac         Current Medications (11/28/2020):  This is the current hospital active medication list Current Facility-Administered Medications  Medication Dose Route Frequency Provider Last Rate Last Admin   acetaminophen (TYLENOL) tablet 325-650 mg  325-650 mg Oral Q6H PRN 11/30/2020, PA-C   650 mg at 11/28/20 1150   alum & mag hydroxide-simeth (MAALOX/MYLANTA) 200-200-20 MG/5ML suspension 30 mL  30 mL Oral Q4H PRN 08-09-2000, PA-C       aspirin chewable tablet 81 mg  81 mg Oral BID Allena Katz, PA-C   81 mg at 11/28/20 0934   bisacodyl (DULCOLAX) EC tablet 5 mg  5 mg Oral Daily PRN 11/30/20 K, PA-C       dextrose 5 % and 0.45 % NaCl with KCl 20 mEq/L infusion  Intravenous Continuous Allena Katz, PA-C   Stopped at 11/24/20 9038   diphenhydrAMINE (BENADRYL) 12.5 MG/5ML elixir 12.5-25 mg  12.5-25 mg Oral Q4H PRN Allena Katz, PA-C       docusate sodium (COLACE) capsule 100 mg  100 mg Oral BID Allena Katz, PA-C   100 mg at 11/28/20 0934   famotidine (PEPCID) tablet 20 mg  20 mg Oral Daily Allena Katz, PA-C   20 mg at 11/28/20 0934   feeding supplement (ENSURE ENLIVE / ENSURE PLUS) liquid 237 mL  237  mL Oral BID BM Gean Birchwood, MD   237 mL at 11/28/20 1150   FLUoxetine (PROZAC) capsule 10 mg  10 mg Oral Daily Allena Katz, PA-C   10 mg at 11/28/20 3338   HYDROmorphone (DILAUDID) injection 0.5-1 mg  0.5-1 mg Intravenous Q4H PRN Allena Katz, PA-C   0.5 mg at 11/24/20 2014   levothyroxine (SYNTHROID) tablet 100 mcg  100 mcg Oral [id number] Allena Katz, PA-C   100 mcg at 11/28/20 0602   menthol-cetylpyridinium (CEPACOL) lozenge 3 mg  1 lozenge Oral PRN Allena Katz, PA-C       Or   phenol (CHLORASEPTIC) mouth spray 1 spray  1 spray Mouth/Throat PRN Allena Katz, PA-C       metoCLOPramide (REGLAN) tablet 5-10 mg  5-10 mg Oral Q8H PRN Allena Katz, PA-C       Or   metoCLOPramide (REGLAN) injection 5-10 mg  5-10 mg Intravenous Q8H PRN Dannielle Burn K, PA-C       ondansetron [facility]) tablet 4 mg  4 mg Oral Q6H PRN Allena Katz, PA-C   4 mg at 11/25/20 1116   Or   ondansetron (ZOFRAN) injection 4 mg  4 mg Intravenous Q6H PRN Allena Katz, PA-C   4 mg at 11/24/20 2014   oxyCODONE (Oxy IR/ROXICODONE) immediate release tablet 10-15 mg  10-15 mg Oral Q4H PRN Allena Katz, PA-C   10 mg at 11/26/20 1660   oxyCODONE (Oxy IR/ROXICODONE) immediate release tablet 5-10 mg  5-10 mg Oral Q4H PRN Allena Katz, PA-C   10 mg at 11/26/20 1825   pantoprazole (PROTONIX) EC tablet 40 mg  40 mg Oral Daily Allena Katz, PA-C   40 mg at 11/28/20 0934   polyethylene glycol (MIRALAX / GLYCOLAX) packet 17 g  17 g Oral Daily PRN Allena Katz, PA-C   17 g at 11/26/20 1826   sodium phosphate (FLEET) 7-19 GM/118ML enema 1 enema  1 enema Rectal Once PRN Allena Katz, PA-C       zolpidem (AMBIEN) tablet 5 mg  5 mg Oral QHS PRN Allena Katz, PA-C         Discharge Medications: Please see discharge summary for a list of discharge medications.  Relevant Imaging Results:  Relevant Lab Results:   Additional Information SSN: [id number]  Ewing Schlein, LCSW

## 2020-11-28 NOTE — Plan of Care (Signed)
  Problem: Activity: Goal: Range of joint motion will improve 11/28/2020 1051 by Sherren Kerns, RN Outcome: Progressing 11/28/2020 0749 by Sherren Kerns, RN Outcome: Progressing   Problem: Clinical Measurements: Goal: Postoperative complications will be avoided or minimized 11/28/2020 1051 by Sherren Kerns, RN Outcome: Progressing 11/28/2020 0749 by Sherren Kerns, RN Outcome: Progressing   Problem: Skin Integrity: Goal: Will show signs of wound healing 11/28/2020 1051 by Sherren Kerns, RN Outcome: Progressing 11/28/2020 0749 by Sherren Kerns, RN Outcome: Progressing

## 2020-11-28 NOTE — Plan of Care (Signed)
  Problem: Pain Management: Goal: Pain level will decrease with appropriate interventions Outcome: Progressing   Problem: Skin Integrity: Goal: Will show signs of wound healing Outcome: Progressing   

## 2020-11-28 NOTE — Progress Notes (Signed)
PATIENT ID: Tonya Johns  MRN: [id number]  DOB/AGE:  [birth year] / 87 y.o.  5 Days Post-Op Procedure(s) (LRB): RIGHT TOTAL KNEE ARTHROPLASTY (Right)    PROGRESS NOTE Subjective: Patient is alert, oriented, no Nausea, no Vomiting, yes passing gas. Taking PO well. Denies SOB, Chest or Calf Pain. Using Incentive Spirometer, PAS in place. Ambulate WBAT with pt walking 24 ft, Patient reports pain as mild to moderate .    Objective: Vital signs in last 24 hours: Vitals:   11/27/20 0557 11/27/20 1246 11/27/20 2132 11/28/20 0546  BP: (!) 160/64 (!) 143/64 (!) 173/69 (!) 150/62  Pulse: 69 76 72 74  Resp: 16 18 18 18   Temp: 98.3 F (36.8 C)  99.8 F (37.7 C) 98.9 F (37.2 C)  TempSrc: Oral  Oral Oral  SpO2: 94% 99% 99% 97%  Weight:      Height:          Intake/Output from previous day: I/O last 3 completed shifts: In: 600 [P.O.:600] Out: 500 [Urine:500]   Intake/Output this shift: Total I/O In: 240 [P.O.:240] Out: -    LABORATORY DATA: Recent Labs    11/26/20 0338  WBC 12.1*  HGB 8.0*  HCT 23.5*  PLT 160     Examination: Neurologically intact Neurovascular intact Sensation intact distally Intact pulses distally Dorsiflexion/Plantar flexion intact Incision: dressing C/D/I and no drainage No cellulitis present Compartment soft}  Assessment:   5 Days Post-Op Procedure(s) (LRB): RIGHT TOTAL KNEE ARTHROPLASTY (Right) ADDITIONAL DIAGNOSIS: Expected Acute Blood Loss Anemia, obesity, LBBB, hypothyroid  Plan: PT/OT WBAT, AROM and PROM  DVT Prophylaxis:  SCDx72hrs, ASA 81 mg BID x 2 weeks DISCHARGE PLAN: Home DISCHARGE NEEDS: HHPT, Walker, and 3-in-1 comode seat     11/28/20 11/28/2020, 7:59 AM

## 2020-11-28 NOTE — Progress Notes (Signed)
Physical Therapy Treatment Patient Details Name: Tonya Johns MRN: [id number] DOB: 1930-09-10 Today's Date: 11/28/2020   History of Present Illness pt is 89 yr admit s/p R TKA , with PMH of LTKA in 2018, RA, depression, DM, fibromyalogia, HTN, and back surgery    PT Comments    POD # 5 pm session General Comments: AxO x 3 improving now that she is no longer taking OXY.  Motor skills also improving. Assisted to [facility] General transfer comment: increased ability to rise but with 50% VC's on proper hand placement and safety with turn completion.  Slow but not as sluggish as yesterday.then amb in hallway.  General Gait Details: slow but improved with increased gait distance/tolerance.  50% VC's on proper walker to self distance and upright posture.  Tolerated amb 72  feet. Then returned to room to perform some TE's following HEP handout.  Instructed on proper tech, freq as well as use of ICE.   Knee flex approx 5 - 85 degrees.  Followed by ICE. Pt and daughters are pursuing SNF as pt lives home alone.    Recommendations for follow up therapy are one component of a multi-disciplinary discharge planning process, led by the attending physician.  Recommendations may be updated based on patient status, additional functional criteria and insurance authorization.  Follow Up Recommendations  Skilled nursing-short term rehab (<3 hours/day)     Assistance Recommended at Discharge Frequent or constant Supervision/Assistance  Equipment Recommendations  None recommended by PT    Recommendations for Other Services       Precautions / Restrictions Precautions Precautions: Knee Precaution Comments: educated with proper positioning of knee and precautions . Restrictions Weight Bearing Restrictions: No RLE Weight Bearing: Weight bearing as tolerated     Mobility  Bed Mobility               General bed mobility comments: OOB in recliner    Transfers Overall transfer level: Needs  assistance Equipment used: Rolling walker (2 wheels) Transfers: Sit to/from [employer] Transfers Sit to Stand: Mod assist Stand pivot transfers: Mod assist;Max assist         General transfer comment: increased ability to rise but with 50% VC's on proper hand placement and safety with turn completion.  Slow but not as sluggish as yesterday.    Ambulation/Gait Ambulation/Gait assistance: Min assist Gait Distance (Feet): 72 Feet Assistive device: Rolling walker (2 wheels) Gait Pattern/deviations: Step-to pattern;Trunk flexed;Antalgic Gait velocity: decreased   General Gait Details: slow but improved with increased gait distance/tolerance.  50% VC's on proper walker to self distance and upright posture.  Tolerated amb 72  feet.   Stairs             Wheelchair Mobility    Modified Rankin (Stroke Patients Only)       Balance                                            Cognition Arousal/Alertness: Awake/alert Behavior During Therapy: WFL for tasks assessed/performed Overall Cognitive Status: Impaired/Different from baseline                                 General Comments: AxO x 3 improving now that she is no longer taking OXY.  Motor skills also improving.  Exercises  Total Knee Replacement TE's following HEP handout 10 reps B LE ankle pumps 05 reps towel squeezes 05 reps knee presses 05 reps heel slides  05 reps SAQ's 05 reps SLR's 05 reps ABD Educated on use of gait belt to assist with TE's Followed by ICE     General Comments        Pertinent Vitals/Pain Pain Assessment: 0-10 Pain Score: 5  Pain Location: R knee Pain Descriptors / Indicators: Discomfort;Sore;Grimacing;Guarding;Aching Pain Intervention(s): Monitored during session;Premedicated before session;Repositioned;Ice applied    Home Living                          Prior Function            PT Goals (current goals can now be  found in the care plan section) Progress towards PT goals: Progressing toward goals    Frequency    7X/week      PT Plan Current plan remains appropriate    Co-evaluation              AM-PAC PT "6 Clicks" Mobility   Outcome Measure  Help needed turning from your back to your side while in a flat bed without using bedrails?: A Lot Help needed moving from lying on your back to sitting on the side of a flat bed without using bedrails?: A Lot Help needed moving to and from a bed to a chair (including a wheelchair)?: A Lot Help needed standing up from a chair using your arms (e.g., wheelchair or bedside chair)?: A Lot Help needed to walk in hospital room?: A Lot Help needed climbing 3-5 steps with a railing? : Total 6 Click Score: 11    End of Session Equipment Utilized During Treatment: Gait belt Activity Tolerance: Patient limited by fatigue Patient left: in chair;with call bell/phone within reach;with chair alarm set;with family/visitor present Nurse Communication: Mobility status PT Visit Diagnosis: Other abnormalities of gait and mobility (R26.89);Pain Pain - Right/Left: Right Pain - part of body: Knee     Time: 4356-8616 PT Time Calculation (min) (ACUTE ONLY): 26 min  Charges:  $Gait Training: 8-22 mins $Therapeutic Exercise: 8-22 mins                    Felecia Shelling  PTA Acute  Rehabilitation Services Pager      [phone] Office      [phone]

## 2020-11-29 MED ORDER — TRAMADOL HCL 50 MG PO TABS
50.0000 mg | ORAL_TABLET | Freq: Four times a day (QID) | ORAL | Status: DC | PRN
  Administered 2020-11-29 (×2): 50 mg via ORAL
  Filled 2020-11-29 (×2): qty 1

## 2020-11-29 MED ORDER — GLUCERNA SHAKE PO LIQD
237.0000 mL | Freq: Three times a day (TID) | ORAL | Status: DC
  Administered 2020-11-29 – 2020-11-30 (×5): 237 mL via ORAL
  Filled 2020-11-29 (×6): qty 237

## 2020-11-29 NOTE — Progress Notes (Signed)
Physical Therapy Treatment Patient Details Name: Tonya Johns MRN: [id number] DOB: [birth year] Today's Date: 11/29/2020   History of Present Illness pt is 63 yr admit s/p R TKA , with PMH of LTKA in 2018, RA, depression, DM, fibromyalogia, HTN, and back surgery    PT Comments    Pt continues very motivated and progressing steadily with mobility.  Pt and family hopeful for dc tomorrow.  Recommendations for follow up therapy are one component of a multi-disciplinary discharge planning process, led by the attending physician.  Recommendations may be updated based on patient status, additional functional criteria and insurance authorization.  Follow Up Recommendations  Skilled nursing-short term rehab (<3 hours/day)     Assistance Recommended at Discharge Frequent or constant Supervision/Assistance  Equipment Recommendations  None recommended by PT    Recommendations for Other Services       Precautions / Restrictions Precautions Precautions: Knee Restrictions Weight Bearing Restrictions: No RLE Weight Bearing: Weight bearing as tolerated     Mobility  Bed Mobility Overal bed mobility: Needs Assistance Bed Mobility: Supine to Sit     Supine to sit: Min guard;HOB elevated     General bed mobility comments: min guard for R LE only    Transfers Overall transfer level: Needs assistance Equipment used: Rolling walker (2 wheels) Transfers: Sit to/from Stand Sit to Stand: Min assist           General transfer comment: cues for LE management and use of UEs to self assist    Ambulation/Gait Ambulation/Gait assistance: Min assist Gait Distance (Feet): 100 Feet Assistive device: Rolling walker (2 wheels) Gait Pattern/deviations: Trunk flexed;Antalgic;Step-to pattern;Step-through pattern;Shuffle Gait velocity: decreased   General Gait Details: cues for posture, position from RW and stride length   Stairs             Wheelchair Mobility    Modified Rankin  (Stroke Patients Only)       Balance Overall balance assessment: Needs assistance Sitting-balance support: Feet supported;No upper extremity supported Sitting balance-Leahy Scale: Good     Standing balance support: Bilateral upper extremity supported Standing balance-Leahy Scale: Poor                              Cognition Arousal/Alertness: Awake/alert Behavior During Therapy: WFL for tasks assessed/performed Overall Cognitive Status: Within Functional Limits for tasks assessed                                          Exercises Total Joint Exercises Ankle Circles/Pumps: AROM;Both;Supine;15 reps Quad Sets: AROM;Right;Supine;10 reps Heel Slides: AROM;Supine;Right;10 reps Hip ABduction/ADduction: AAROM;Right;10 reps Straight Leg Raises: AAROM;Right;10 reps Goniometric ROM: -10 - 60 AAROM    General Comments        Pertinent Vitals/Pain Pain Assessment: 0-10 Pain Score: 6  Pain Location: R knee Pain Descriptors / Indicators: Discomfort;Sore;Grimacing;Guarding;Aching Pain Intervention(s): Limited activity within patient's tolerance;Monitored during session;Premedicated before session;Ice applied    Home Living                          Prior Function            PT Goals (current goals can now be found in the care plan section) Acute Rehab PT Goals Patient Stated Goal: I want to be active and be able to get around better PT  Goal Formulation: With patient Time For Goal Achievement: 12/06/20 Progress towards PT goals: Progressing toward goals    Frequency    7X/week      PT Plan Current plan remains appropriate    Co-evaluation              AM-PAC PT "6 Clicks" Mobility   Outcome Measure  Help needed turning from your back to your side while in a flat bed without using bedrails?: A Little Help needed moving from lying on your back to sitting on the side of a flat bed without using bedrails?: A Little Help  needed moving to and from a bed to a chair (including a wheelchair)?: A Lot Help needed standing up from a chair using your arms (e.g., wheelchair or bedside chair)?: A Lot Help needed to walk in hospital room?: A Little Help needed climbing 3-5 steps with a railing? : A Lot 6 Click Score: 15    End of Session Equipment Utilized During Treatment: Gait belt Activity Tolerance: Patient limited by fatigue;Patient tolerated treatment well Patient left: in chair;with call bell/phone within reach;with chair alarm set;with family/visitor present Nurse Communication: Mobility status PT Visit Diagnosis: Other abnormalities of gait and mobility (R26.89);Pain Pain - Right/Left: Right Pain - part of body: Knee     Time: 6045-4098 PT Time Calculation (min) (ACUTE ONLY): 37 min  Charges:  $Gait Training: 8-22 mins $Therapeutic Exercise: 8-22 mins                     Mauro Kaufmann PT Acute Rehabilitation Services Pager [phone] Office [phone]    [name] 11/29/2020, 11:41 AM

## 2020-11-29 NOTE — Progress Notes (Signed)
Provider notified of need for additional pain medication and also high bp at times. Provider already away of high bp and stated she would need to follow up with her primary.

## 2020-11-29 NOTE — Progress Notes (Signed)
PATIENT ID: Tonya Johns  MRN: [id number]  DOB/AGE:  85-Sep-1932 / 85 y.o.  6 Days Post-Op Procedure(s) (LRB): RIGHT TOTAL KNEE ARTHROPLASTY (Right)    PROGRESS NOTE Subjective: Patient is alert, oriented, no Nausea, no Vomiting.  Pain well controlled.     Objective: Vital signs in last 24 hours: Vitals:   11/28/20 1938 11/28/20 2101 11/29/20 0521 11/29/20 0700  BP: (!) 156/65 (!) 177/68 (!) 182/73 (!) 175/65  Pulse:  70 72   Resp:  20 16   Temp:  99.5 F (37.5 C) 98.2 F (36.8 C)   TempSrc:  Oral Oral   SpO2:  99% 100%   Weight:      Height:          Intake/Output from previous day: I/O last 3 completed shifts: In: 2020 [P.O.:2020] Out: 500 [Urine:500]   Intake/Output this shift: Total I/O In: 240 [P.O.:240] Out: -    LABORATORY DATA: No results for input(s): WBC, HGB, HCT, PLT, NA, K, CL, CO2, BUN, CREATININE, GLUCOSE, GLUCAP, INR, CALCIUM in the last 72 hours.  Invalid input(s): PT, 2   Examination: Neurologically intact Neurovascular intact Sensation intact distally Intact pulses distally Dorsiflexion/Plantar flexion intact Incision: dressing C/D/I and no drainage No cellulitis present Compartment soft}  Assessment:   6 Days Post-Op Procedure(s) (LRB): RIGHT TOTAL KNEE ARTHROPLASTY (Right) ADDITIONAL DIAGNOSIS: Expected Acute Blood Loss Anemia, obesity, LBBB, hypothyroid  Plan: PT/OT WBAT, AROM and PROM  DVT Prophylaxis:  SCDx72hrs, ASA 81 mg BID x 2 weeks DISCHARGE PLAN: Home DISCHARGE NEEDS: HHPT, Walker, and 3-in-1 comode seat     Ernestina Columbia 11/29/2020, 9:25 AM

## 2020-11-29 NOTE — Plan of Care (Signed)
  Problem: Activity: Goal: Ability to avoid complications of mobility impairment will improve 11/29/2020 0912 by Sherren Kerns, RN Outcome: Progressing 11/29/2020 0855 by Sherren Kerns, RN Outcome: Progressing   Problem: Skin Integrity: Goal: Will show signs of wound healing 11/29/2020 0912 by Sherren Kerns, RN Outcome: Progressing 11/29/2020 0855 by Sherren Kerns, RN Outcome: Progressing   Problem: Education: Goal: Knowledge of General Education information will improve Description: Including pain rating scale, medication(s)/side effects and non-pharmacologic comfort measures 11/29/2020 0912 by Sherren Kerns, RN Outcome: Progressing 11/29/2020 0855 by Sherren Kerns, RN Outcome: Progressing

## 2020-11-29 NOTE — Plan of Care (Signed)
  Problem: Activity: Goal: Range of joint motion will improve Outcome: Progressing   Problem: Pain Management: Goal: Pain level will decrease with appropriate interventions Outcome: Progressing   Problem: Education: Goal: Knowledge of General Education information will improve Description: Including pain rating scale, medication(s)/side effects and non-pharmacologic comfort measures Outcome: Progressing   Problem: Clinical Measurements: Goal: Ability to maintain clinical measurements within normal limits will improve Outcome: Progressing   Problem: Nutrition: Goal: Adequate nutrition will be maintained Outcome: Progressing

## 2020-11-29 NOTE — TOC Progression Note (Addendum)
Transition of Care [facility]) - Progression Note    Patient Details  Name: Tonya Johns MRN: [id number] Date of Birth: 1930/11/27  [facility] of [facility]) CM/SW Contact  Darleene Cleaver, [place] Phone Number: 11/29/2020, 11:35 AM  Clinical Narrative:     CSW spoke to patient's daughter to discuss SNF verse home health.  Per daughter they want to see how patient is doing tomorrow, and will see if they want to pursue SNF still or go home with home health.  CSW spoke to patient's daughter and asked if she had a preference for home health agency.    CSW contacted [facility] and they have been prearranged to accept for [facility] PT, and OT.  Patient should be able to discharge tomorrow, depending on if patient is medically ready for discharge.  CSW to continue to follow patient's progress throughout discharge planning.  Expected Discharge Plan: Skilled Nursing Facility Barriers to Discharge: SNF Pending bed offer  Expected Discharge Plan and Services Expected Discharge Plan: Skilled Nursing Facility         Expected Discharge Date: 11/27/20               DME Arranged: N/A DME Agency: NA       HH Arranged: PT, Nurse's Aide HH Agency: Programmer, multimedia spoke with at [facility] Agency: Prearranged in orthopedist's office   Social Determinants of Health (SDOH) Interventions    Readmission Risk Interventions No flowsheet data found.

## 2020-11-29 NOTE — Progress Notes (Signed)
Physical Therapy Treatment Patient Details Name: Tonya Johns MRN: [id number] DOB: [birth year] Today's Date: 11/29/2020   History of Present Illness pt is 16 yr admit s/p R TKA , with PMH of LTKA in 2018, RA, depression, DM, fibromyalogia, HTN, and back surgery    PT Comments    Pt continues very cooperative and progressing steadily with mobility.  This pm, pt up to ambulate increased distance and up to bathroom for toileting and hygiene standing at sink.  Pt and dtr present stating that with pts progress, they feel much more comfortable with dc to home with HHPT follow up.  Recommendations for follow up therapy are one component of a multi-disciplinary discharge planning process, led by the attending physician.  Recommendations may be updated based on patient status, additional functional criteria and insurance authorization.  Follow Up Recommendations  Home health PT     Assistance Recommended at Discharge Frequent or constant Supervision/Assistance  Equipment Recommendations  None recommended by PT    Recommendations for Other Services       Precautions / Restrictions Precautions Precautions: Knee Precaution Comments: educated with proper positioning of knee and precautions . Restrictions Weight Bearing Restrictions: No RLE Weight Bearing: Weight bearing as tolerated     Mobility  Bed Mobility Overal bed mobility: Needs Assistance Bed Mobility: Sit to Supine       Sit to supine: Min assist;HOB elevated   General bed mobility comments: min assist to bring R LE up onto bed    Transfers Overall transfer level: Needs assistance Equipment used: Rolling walker (2 wheels) Transfers: Sit to/from Stand Sit to Stand: Min guard           General transfer comment: cues for LE management and use of UEs to self assist    Ambulation/Gait Ambulation/Gait assistance: Min guard Gait Distance (Feet): 100 Feet (and 15' into bathroom) Assistive device: Rolling walker (2  wheels) Gait Pattern/deviations: Trunk flexed;Antalgic;Step-to pattern;Step-through pattern;Shuffle Gait velocity: decreased   General Gait Details: cues for posture, position from RW and stride length   Stairs             Wheelchair Mobility    Modified Rankin (Stroke Patients Only)       Balance Overall balance assessment: Needs assistance Sitting-balance support: Feet supported;No upper extremity supported Sitting balance-Leahy Scale: Good     Standing balance support: No upper extremity supported Standing balance-Leahy Scale: Fair                              Cognition Arousal/Alertness: Awake/alert Behavior During Therapy: WFL for tasks assessed/performed Overall Cognitive Status: Within Functional Limits for tasks assessed                                          Exercises      General Comments        Pertinent Vitals/Pain Pain Assessment: 0-10 Pain Score: 7  Pain Location: R knee Pain Descriptors / Indicators: Discomfort;Sore;Grimacing;Guarding;Aching Pain Intervention(s): Limited activity within patient's tolerance;Monitored during session;Premedicated before session;Ice applied    Home Living                          Prior Function            PT Goals (current goals can now be found in the care  plan section) Acute Rehab PT Goals Patient Stated Goal: I want to be active and be able to get around better PT Goal Formulation: With patient Time For Goal Achievement: 12/06/20 Progress towards PT goals: Progressing toward goals    Frequency    7X/week      PT Plan Discharge plan needs to be updated    Co-evaluation              AM-PAC PT "6 Clicks" Mobility   Outcome Measure  Help needed turning from your back to your side while in a flat bed without using bedrails?: A Little Help needed moving from lying on your back to sitting on the side of a flat bed without using bedrails?: A  Little Help needed moving to and from a bed to a chair (including a wheelchair)?: A Little Help needed standing up from a chair using your arms (e.g., wheelchair or bedside chair)?: A Little Help needed to walk in hospital room?: A Little Help needed climbing 3-5 steps with a railing? : A Lot 6 Click Score: 17    End of Session Equipment Utilized During Treatment: Gait belt Activity Tolerance: Patient tolerated treatment well Patient left: in bed;with call bell/phone within reach;with family/visitor present;with bed alarm set Nurse Communication: Mobility status PT Visit Diagnosis: Other abnormalities of gait and mobility (R26.89);Pain Pain - Right/Left: Right Pain - part of body: Knee     Time: 1411-1440 PT Time Calculation (min) (ACUTE ONLY): 29 min  Charges:  $Gait Training: 8-22 mins $Therapeutic Activity: 8-22 mins                     Mauro Kaufmann PT Acute Rehabilitation Services Pager [phone] Office [phone]    Tonya Johns 11/29/2020, 4:09 PM

## 2020-11-30 NOTE — Care Plan (Signed)
Ortho Bundle Case Management Note  Patient Details  Name: Tonya Johns MRN: [id number] Date of Birth: Oct 06, 1930     Chart reviewed. Patient is set up with [facility] for HHPT and HHOT if she is able to discharge to home today. She has equipment at home.  I have spoken with the agency and they are prepared to see her at home tomorrow.  In house PT will need to see her twice today prior to discharge for safey issues.   Please call me with questions  Thanks                DME Arranged:  N/A DME Agency:  NA  HH Arranged:  PT, Nurse's Aide HH Agency:  [facility]  Additional Comments: Please contact me with any questions of if this plan should need to change.  Shauna Hugh,  RN,BSN,MHA,CCM  [facility] Orthopaedic Specialist  [phone] 11/30/2020, 10:22 AM

## 2020-11-30 NOTE — Plan of Care (Signed)
°  Problem: Education: °Goal: Knowledge of the prescribed therapeutic regimen will improve °Outcome: Progressing °  °Problem: Clinical Measurements: °Goal: Postoperative complications will be avoided or minimized °Outcome: Progressing °  °Problem: Pain Management: °Goal: Pain level will decrease with appropriate interventions °Outcome: Progressing °  °

## 2020-11-30 NOTE — Progress Notes (Signed)
Discharge package printed and instructions given to patient and daughter. Daughter verbalizes understanding.

## 2020-11-30 NOTE — Plan of Care (Signed)
  Problem: Clinical Measurements: Goal: Postoperative complications will be avoided or minimized Outcome: Progressing   Problem: Pain Management: Goal: Pain level will decrease with appropriate interventions Outcome: Progressing   

## 2020-11-30 NOTE — Progress Notes (Signed)
Physical Therapy Treatment Patient Details Name: Tonya Johns MRN: [id number] DOB: Mar 26, 1930 Today's Date: 11/30/2020   History of Present Illness pt is 32 yr admit s/p R TKA , with PMH of LTKA in 2018, RA, depression, DM, fibromyalogia, HTN, and back surgery    PT Comments    POD # 7 MUCH improved to be able to D/C to home with family support today.   Recommendations for follow up therapy are one component of a multi-disciplinary discharge planning process, led by the attending physician.  Recommendations may be updated based on patient status, additional functional criteria and insurance authorization.  Follow Up Recommendations  Home health PT     Assistance Recommended at Discharge Frequent or constant Supervision/Assistance  Equipment Recommendations  None recommended by PT    Recommendations for Other Services       Precautions / Restrictions Precautions Precautions: Knee Precaution Comments: educated with proper positioning of knee and precautions . Restrictions Weight Bearing Restrictions: No RLE Weight Bearing: Weight bearing as tolerated     Mobility  Bed Mobility               General bed mobility comments: OOB in recliner    Transfers Overall transfer level: Needs assistance   Transfers: Sit to/from Stand;Stand Pivot Transfers Sit to Stand: Supervision Stand pivot transfers: Supervision         General transfer comment: self able to rise from recliner and toilet as well as self able to maintain a safe static standing balance due self peri care.    Ambulation/Gait Ambulation/Gait assistance: Supervision Gait Distance (Feet): 115 Feet Assistive device: Rolling walker (2 wheels) Gait Pattern/deviations: Trunk flexed;Antalgic;Step-to pattern;Step-through pattern;Shuffle Gait velocity: decreased   General Gait Details: slow but steady as well as a safe functional distance.  Does better with her sneakers.   Stairs Stairs:  (RAMP)            Wheelchair Mobility    Modified Rankin (Stroke Patients Only)       Balance                                            Cognition Arousal/Alertness: Awake/alert                                     General Comments: AxO x 3 appears at prior cognitive level        Exercises      General Comments        Pertinent Vitals/Pain Pain Assessment: 0-10 Pain Score: 6  Pain Location: R knee Pain Descriptors / Indicators: Discomfort;Sore;Grimacing;Guarding;Aching Pain Intervention(s): Repositioned;Ice applied;Premedicated before session    Home Living                          Prior Function            PT Goals (current goals can now be found in the care plan section) Progress towards PT goals: Progressing toward goals    Frequency    7X/week      PT Plan Current plan remains appropriate    Co-evaluation              AM-PAC PT "6 Clicks" Mobility   Outcome Measure  Help needed turning from your back to your  side while in a flat bed without using bedrails?: A Little Help needed moving from lying on your back to sitting on the side of a flat bed without using bedrails?: A Little Help needed moving to and from a bed to a chair (including a wheelchair)?: A Little Help needed standing up from a chair using your arms (e.g., wheelchair or bedside chair)?: A Little Help needed to walk in hospital room?: A Little Help needed climbing 3-5 steps with a railing? : A Little 6 Click Score: 18    End of Session Equipment Utilized During Treatment: Gait belt Activity Tolerance: Patient tolerated treatment well Patient left: in chair;with call bell/phone within reach Nurse Communication: Mobility status (pt has met goals to safely D/C to home with family today) PT Visit Diagnosis: Other abnormalities of gait and mobility (R26.89);Pain Pain - Right/Left: Right Pain - part of body: Knee     Time: 1345-1410 PT Time  Calculation (min) (ACUTE ONLY): 25 min  Charges:  $Gait Training: 8-22 mins $Therapeutic Activity: 8-22 mins                     Rica Koyanagi  PTA Acute  Rehabilitation Services Pager      [phone] Office      [phone]

## 2020-11-30 NOTE — Progress Notes (Signed)
PATIENT ID: Tonya Johns  MRN: [id number]  DOB/AGE:  03-20-1930 / 87 y.o.  7 Days Post-Op Procedure(s) (LRB): RIGHT TOTAL KNEE ARTHROPLASTY (Right)    PROGRESS NOTE Subjective: Patient is alert, oriented, no Nausea, no Vomiting, yes passing gas. Taking PO well. Denies SOB, Chest or Calf Pain. Using Incentive Spirometer, PAS in place. Ambulate WBAT with pt walking 100 ft with therapy yesterday, Patient reports pain as mild at rest .    Objective: Vital signs in last 24 hours: Vitals:   11/29/20 1143 11/29/20 1532 11/29/20 2306 11/30/20 0641  BP: 140/65 (!) 142/71 (!) 187/78 (!) 188/75  Pulse:  73 67 67  Resp:   16 16  Temp:  97.7 F (36.5 C) 98.6 F (37 C) 98.3 F (36.8 C)  TempSrc:  Oral Oral   SpO2:  100% 98% 97%  Weight:      Height:          Intake/Output from previous day: I/O last 3 completed shifts: In: 2040 [P.O.:2040] Out: -    Intake/Output this shift: No intake/output data recorded.   LABORATORY DATA: No results for input(s): WBC, HGB, HCT, PLT, NA, K, CL, CO2, BUN, CREATININE, GLUCOSE, GLUCAP, INR, CALCIUM in the last 72 hours.  Invalid input(s): PT, 2  Examination: Neurologically intact Neurovascular intact Sensation intact distally Intact pulses distally Dorsiflexion/Plantar flexion intact Incision: dressing C/D/I and no drainage No cellulitis present Compartment soft}  Assessment:   7 Days Post-Op Procedure(s) (LRB): RIGHT TOTAL KNEE ARTHROPLASTY (Right) ADDITIONAL DIAGNOSIS: Expected Acute Blood Loss Anemia, obesity, LBBB, hypothyroid  Anticipated LOS equal to or greater than 2 midnights due to - Age 85 and older with one or more of the following:  - Obesity  - Expected need for hospital services (PT, OT, Nursing) required for safe  discharge  - Anticipated need for postoperative skilled nursing care or inpatient rehab   OR   - Unanticipated findings during/Post Surgery: Slow post-op progression: GI, pain control, mobility    Plan: PT/OT  WBAT, AROM and PROM  DVT Prophylaxis:  SCDx72hrs, ASA 81 mg BID x 2 weeks DISCHARGE PLAN: Home DISCHARGE NEEDS: HHPT, Walker, and 3-in-1 comode seat     Dannielle Burn 11/30/2020, 12:29 PM

## 2020-11-30 NOTE — TOC Transition Note (Signed)
Transition of Care [facility]) - CM/SW Discharge Note  Patient Details  Name: Tonya Johns MRN: [id number] Date of Birth: [birth year]  [facility]) CM/SW Contact:  Sherie Don, LCSW Phone Number: 11/30/2020, 11:50 AM  Clinical Narrative: Patient was able to walk 100 feet with PT yesterday. CSW met with patient and her daughter, [name], to discuss progress and discharge plan. Daughter reported the family thinks the patient is safe to go home with HHPT and will not need SNF at this time. TOC signing off.  Final next level of care: [place] Barriers to Discharge: Barriers Resolved  Patient Goals and CMS Choice Patient states their goals for this hospitalization and ongoing recovery are:: Discharge home with HHPT and then transition to [facility] CMS Medicare.gov Compare Post Acute Care list provided to:: Patient Choice offered to / list presented to : Patient  Discharge Plan and Services         DME Arranged: N/A DME Agency: NA HH Arranged: PT, Nurse's Aide HH Agency: [place] Representative spoke with at [place] in orthopedist's office  Readmission Risk Interventions No flowsheet data found.

## 2020-12-01 DIAGNOSIS — Z471 Aftercare following joint replacement surgery: Secondary | ICD-10-CM | POA: Diagnosis not present

## 2020-12-01 DIAGNOSIS — R030 Elevated blood-pressure reading, without diagnosis of hypertension: Secondary | ICD-10-CM | POA: Diagnosis not present

## 2020-12-01 DIAGNOSIS — I447 Left bundle-branch block, unspecified: Secondary | ICD-10-CM | POA: Diagnosis not present

## 2020-12-01 DIAGNOSIS — K219 Gastro-esophageal reflux disease without esophagitis: Secondary | ICD-10-CM | POA: Diagnosis not present

## 2020-12-01 DIAGNOSIS — E042 Nontoxic multinodular goiter: Secondary | ICD-10-CM | POA: Diagnosis not present

## 2020-12-01 DIAGNOSIS — H9319 Tinnitus, unspecified ear: Secondary | ICD-10-CM | POA: Diagnosis not present

## 2020-12-01 DIAGNOSIS — G479 Sleep disorder, unspecified: Secondary | ICD-10-CM | POA: Diagnosis not present

## 2020-12-01 DIAGNOSIS — M810 Age-related osteoporosis without current pathological fracture: Secondary | ICD-10-CM | POA: Diagnosis not present

## 2020-12-01 DIAGNOSIS — E890 Postprocedural hypothyroidism: Secondary | ICD-10-CM | POA: Diagnosis not present

## 2020-12-01 DIAGNOSIS — F32A Depression, unspecified: Secondary | ICD-10-CM | POA: Diagnosis not present

## 2020-12-01 DIAGNOSIS — I351 Nonrheumatic aortic (valve) insufficiency: Secondary | ICD-10-CM | POA: Diagnosis not present

## 2020-12-01 DIAGNOSIS — Z7982 Long term (current) use of aspirin: Secondary | ICD-10-CM | POA: Diagnosis not present

## 2020-12-01 DIAGNOSIS — D649 Anemia, unspecified: Secondary | ICD-10-CM | POA: Diagnosis not present

## 2020-12-01 DIAGNOSIS — Z96653 Presence of artificial knee joint, bilateral: Secondary | ICD-10-CM | POA: Diagnosis not present

## 2020-12-01 DIAGNOSIS — E785 Hyperlipidemia, unspecified: Secondary | ICD-10-CM | POA: Diagnosis not present

## 2020-12-03 DIAGNOSIS — Z9889 Other specified postprocedural states: Secondary | ICD-10-CM | POA: Diagnosis not present

## 2020-12-03 DIAGNOSIS — Z471 Aftercare following joint replacement surgery: Secondary | ICD-10-CM | POA: Diagnosis not present

## 2020-12-03 DIAGNOSIS — Z7982 Long term (current) use of aspirin: Secondary | ICD-10-CM | POA: Diagnosis not present

## 2020-12-03 DIAGNOSIS — K219 Gastro-esophageal reflux disease without esophagitis: Secondary | ICD-10-CM | POA: Diagnosis not present

## 2020-12-03 DIAGNOSIS — D649 Anemia, unspecified: Secondary | ICD-10-CM | POA: Diagnosis not present

## 2020-12-03 DIAGNOSIS — G479 Sleep disorder, unspecified: Secondary | ICD-10-CM | POA: Diagnosis not present

## 2020-12-03 DIAGNOSIS — F32A Depression, unspecified: Secondary | ICD-10-CM | POA: Diagnosis not present

## 2020-12-03 DIAGNOSIS — E785 Hyperlipidemia, unspecified: Secondary | ICD-10-CM | POA: Diagnosis not present

## 2020-12-03 DIAGNOSIS — E042 Nontoxic multinodular goiter: Secondary | ICD-10-CM | POA: Diagnosis not present

## 2020-12-03 DIAGNOSIS — I447 Left bundle-branch block, unspecified: Secondary | ICD-10-CM | POA: Diagnosis not present

## 2020-12-03 DIAGNOSIS — M810 Age-related osteoporosis without current pathological fracture: Secondary | ICD-10-CM | POA: Diagnosis not present

## 2020-12-03 DIAGNOSIS — I351 Nonrheumatic aortic (valve) insufficiency: Secondary | ICD-10-CM | POA: Diagnosis not present

## 2020-12-03 DIAGNOSIS — E890 Postprocedural hypothyroidism: Secondary | ICD-10-CM | POA: Diagnosis not present

## 2020-12-03 DIAGNOSIS — R030 Elevated blood-pressure reading, without diagnosis of hypertension: Secondary | ICD-10-CM | POA: Diagnosis not present

## 2020-12-03 DIAGNOSIS — H9319 Tinnitus, unspecified ear: Secondary | ICD-10-CM | POA: Diagnosis not present

## 2020-12-03 DIAGNOSIS — Z96653 Presence of artificial knee joint, bilateral: Secondary | ICD-10-CM | POA: Diagnosis not present

## 2020-12-04 ENCOUNTER — Other Ambulatory Visit: Payer: Self-pay | Admitting: Family Medicine

## 2020-12-04 DIAGNOSIS — H9319 Tinnitus, unspecified ear: Secondary | ICD-10-CM | POA: Diagnosis not present

## 2020-12-04 DIAGNOSIS — F32A Depression, unspecified: Secondary | ICD-10-CM | POA: Diagnosis not present

## 2020-12-04 DIAGNOSIS — K219 Gastro-esophageal reflux disease without esophagitis: Secondary | ICD-10-CM | POA: Diagnosis not present

## 2020-12-04 DIAGNOSIS — M810 Age-related osteoporosis without current pathological fracture: Secondary | ICD-10-CM | POA: Diagnosis not present

## 2020-12-04 DIAGNOSIS — D649 Anemia, unspecified: Secondary | ICD-10-CM | POA: Diagnosis not present

## 2020-12-04 DIAGNOSIS — Z471 Aftercare following joint replacement surgery: Secondary | ICD-10-CM | POA: Diagnosis not present

## 2020-12-04 DIAGNOSIS — E785 Hyperlipidemia, unspecified: Secondary | ICD-10-CM | POA: Diagnosis not present

## 2020-12-04 DIAGNOSIS — E042 Nontoxic multinodular goiter: Secondary | ICD-10-CM | POA: Diagnosis not present

## 2020-12-04 DIAGNOSIS — Z7982 Long term (current) use of aspirin: Secondary | ICD-10-CM | POA: Diagnosis not present

## 2020-12-04 DIAGNOSIS — I351 Nonrheumatic aortic (valve) insufficiency: Secondary | ICD-10-CM | POA: Diagnosis not present

## 2020-12-04 DIAGNOSIS — R030 Elevated blood-pressure reading, without diagnosis of hypertension: Secondary | ICD-10-CM | POA: Diagnosis not present

## 2020-12-04 DIAGNOSIS — Z96653 Presence of artificial knee joint, bilateral: Secondary | ICD-10-CM | POA: Diagnosis not present

## 2020-12-04 DIAGNOSIS — E890 Postprocedural hypothyroidism: Secondary | ICD-10-CM | POA: Diagnosis not present

## 2020-12-04 DIAGNOSIS — G479 Sleep disorder, unspecified: Secondary | ICD-10-CM | POA: Diagnosis not present

## 2020-12-04 DIAGNOSIS — I447 Left bundle-branch block, unspecified: Secondary | ICD-10-CM | POA: Diagnosis not present

## 2020-12-07 DIAGNOSIS — K219 Gastro-esophageal reflux disease without esophagitis: Secondary | ICD-10-CM | POA: Diagnosis not present

## 2020-12-07 DIAGNOSIS — Z7982 Long term (current) use of aspirin: Secondary | ICD-10-CM | POA: Diagnosis not present

## 2020-12-07 DIAGNOSIS — I351 Nonrheumatic aortic (valve) insufficiency: Secondary | ICD-10-CM | POA: Diagnosis not present

## 2020-12-07 DIAGNOSIS — D649 Anemia, unspecified: Secondary | ICD-10-CM | POA: Diagnosis not present

## 2020-12-07 DIAGNOSIS — E785 Hyperlipidemia, unspecified: Secondary | ICD-10-CM | POA: Diagnosis not present

## 2020-12-07 DIAGNOSIS — Z96653 Presence of artificial knee joint, bilateral: Secondary | ICD-10-CM | POA: Diagnosis not present

## 2020-12-07 DIAGNOSIS — Z471 Aftercare following joint replacement surgery: Secondary | ICD-10-CM | POA: Diagnosis not present

## 2020-12-07 DIAGNOSIS — E890 Postprocedural hypothyroidism: Secondary | ICD-10-CM | POA: Diagnosis not present

## 2020-12-07 DIAGNOSIS — I447 Left bundle-branch block, unspecified: Secondary | ICD-10-CM | POA: Diagnosis not present

## 2020-12-07 DIAGNOSIS — M810 Age-related osteoporosis without current pathological fracture: Secondary | ICD-10-CM | POA: Diagnosis not present

## 2020-12-07 DIAGNOSIS — E042 Nontoxic multinodular goiter: Secondary | ICD-10-CM | POA: Diagnosis not present

## 2020-12-07 DIAGNOSIS — F32A Depression, unspecified: Secondary | ICD-10-CM | POA: Diagnosis not present

## 2020-12-07 DIAGNOSIS — H9319 Tinnitus, unspecified ear: Secondary | ICD-10-CM | POA: Diagnosis not present

## 2020-12-07 DIAGNOSIS — R030 Elevated blood-pressure reading, without diagnosis of hypertension: Secondary | ICD-10-CM | POA: Diagnosis not present

## 2020-12-07 DIAGNOSIS — G479 Sleep disorder, unspecified: Secondary | ICD-10-CM | POA: Diagnosis not present

## 2020-12-08 ENCOUNTER — Telehealth: Payer: Medicare Other

## 2020-12-08 ENCOUNTER — Telehealth: Payer: Self-pay

## 2020-12-08 NOTE — Telephone Encounter (Signed)
  Care Management   Follow Up Note   12/08/2020 Name: Pheobe Sandiford MRN: [id number] DOB: 1930-09-09  Referred by: Sheliah Hatch, MD Reason for referral : Chronic Care Management  An unsuccessful telephone outreach was attempted today. The patient was referred to the case management team for assistance with care management and care coordination.   Follow Up Plan:  No show to 12/08/20 - CPA to reschedule f/u CPP phone visit  Dahlia Byes, PharmD, CPP Clinical Pharmacist Practitioner  [facility]  [phone]

## 2020-12-09 DIAGNOSIS — M810 Age-related osteoporosis without current pathological fracture: Secondary | ICD-10-CM | POA: Diagnosis not present

## 2020-12-09 DIAGNOSIS — I351 Nonrheumatic aortic (valve) insufficiency: Secondary | ICD-10-CM | POA: Diagnosis not present

## 2020-12-09 DIAGNOSIS — F32A Depression, unspecified: Secondary | ICD-10-CM | POA: Diagnosis not present

## 2020-12-09 DIAGNOSIS — E042 Nontoxic multinodular goiter: Secondary | ICD-10-CM | POA: Diagnosis not present

## 2020-12-09 DIAGNOSIS — D649 Anemia, unspecified: Secondary | ICD-10-CM | POA: Diagnosis not present

## 2020-12-09 DIAGNOSIS — R030 Elevated blood-pressure reading, without diagnosis of hypertension: Secondary | ICD-10-CM | POA: Diagnosis not present

## 2020-12-09 DIAGNOSIS — G479 Sleep disorder, unspecified: Secondary | ICD-10-CM | POA: Diagnosis not present

## 2020-12-09 DIAGNOSIS — E890 Postprocedural hypothyroidism: Secondary | ICD-10-CM | POA: Diagnosis not present

## 2020-12-09 DIAGNOSIS — Z471 Aftercare following joint replacement surgery: Secondary | ICD-10-CM | POA: Diagnosis not present

## 2020-12-09 DIAGNOSIS — E785 Hyperlipidemia, unspecified: Secondary | ICD-10-CM | POA: Diagnosis not present

## 2020-12-09 DIAGNOSIS — Z7982 Long term (current) use of aspirin: Secondary | ICD-10-CM | POA: Diagnosis not present

## 2020-12-09 DIAGNOSIS — H9319 Tinnitus, unspecified ear: Secondary | ICD-10-CM | POA: Diagnosis not present

## 2020-12-09 DIAGNOSIS — Z96653 Presence of artificial knee joint, bilateral: Secondary | ICD-10-CM | POA: Diagnosis not present

## 2020-12-09 DIAGNOSIS — K219 Gastro-esophageal reflux disease without esophagitis: Secondary | ICD-10-CM | POA: Diagnosis not present

## 2020-12-09 DIAGNOSIS — I447 Left bundle-branch block, unspecified: Secondary | ICD-10-CM | POA: Diagnosis not present

## 2020-12-10 NOTE — Telephone Encounter (Signed)
Rescheduled to 12/16/20 telephone visit with CPP at 9:00am

## 2020-12-11 DIAGNOSIS — G479 Sleep disorder, unspecified: Secondary | ICD-10-CM | POA: Diagnosis not present

## 2020-12-11 DIAGNOSIS — Z471 Aftercare following joint replacement surgery: Secondary | ICD-10-CM | POA: Diagnosis not present

## 2020-12-11 DIAGNOSIS — R030 Elevated blood-pressure reading, without diagnosis of hypertension: Secondary | ICD-10-CM | POA: Diagnosis not present

## 2020-12-11 DIAGNOSIS — E890 Postprocedural hypothyroidism: Secondary | ICD-10-CM | POA: Diagnosis not present

## 2020-12-11 DIAGNOSIS — I351 Nonrheumatic aortic (valve) insufficiency: Secondary | ICD-10-CM | POA: Diagnosis not present

## 2020-12-11 DIAGNOSIS — Z96653 Presence of artificial knee joint, bilateral: Secondary | ICD-10-CM | POA: Diagnosis not present

## 2020-12-11 DIAGNOSIS — E042 Nontoxic multinodular goiter: Secondary | ICD-10-CM | POA: Diagnosis not present

## 2020-12-11 DIAGNOSIS — Z7982 Long term (current) use of aspirin: Secondary | ICD-10-CM | POA: Diagnosis not present

## 2020-12-11 DIAGNOSIS — I447 Left bundle-branch block, unspecified: Secondary | ICD-10-CM | POA: Diagnosis not present

## 2020-12-11 DIAGNOSIS — H9319 Tinnitus, unspecified ear: Secondary | ICD-10-CM | POA: Diagnosis not present

## 2020-12-11 DIAGNOSIS — D649 Anemia, unspecified: Secondary | ICD-10-CM | POA: Diagnosis not present

## 2020-12-11 DIAGNOSIS — E785 Hyperlipidemia, unspecified: Secondary | ICD-10-CM | POA: Diagnosis not present

## 2020-12-11 DIAGNOSIS — K219 Gastro-esophageal reflux disease without esophagitis: Secondary | ICD-10-CM | POA: Diagnosis not present

## 2020-12-11 DIAGNOSIS — M810 Age-related osteoporosis without current pathological fracture: Secondary | ICD-10-CM | POA: Diagnosis not present

## 2020-12-11 DIAGNOSIS — F32A Depression, unspecified: Secondary | ICD-10-CM | POA: Diagnosis not present

## 2020-12-14 DIAGNOSIS — Z96651 Presence of right artificial knee joint: Secondary | ICD-10-CM | POA: Diagnosis not present

## 2020-12-15 NOTE — Progress Notes (Signed)
Chronic Care Management Pharmacy Note   Summary:  Patient GERD symptoms have improved with Protonix.  No other changes to meds recently.  Past few office Bps elevated.  Reports systolic in 759F with PT.  Not checking BP at home currently.  Recommendations: No changes to meds FU on BP at December office visit.  No symptoms of HTN currently.  FU: 3 month BP check in 6 month PharmD.  12/16/2020 Name:  Tonya Johns MRN:  [id number] DOB:  November 08, 1930  Subjective: Tonya Johns is an 87 y.o. year old female who is a primary patient of Tabori, Aundra Millet, MD.  The CCM team was consulted for assistance with disease management and care coordination needs.    Engaged with patient by telephone for follow up visit in response to provider referral for pharmacy case management and/or care coordination services.   Consent to Services:  The patient was given information about Chronic Care Management services, agreed to services, and gave verbal consent prior to initiation of services.  Please see initial visit note for detailed documentation.   Patient Care Team: Midge Minium, MD as PCP - General (Family Medicine) Buford Dresser, MD as PCP - Cardiology (Cardiology) Renato Shin, MD as Consulting Physician (Endocrinology) Frederik Pear, MD as Consulting Physician (Orthopedic Surgery) Edythe Clarity, [facility] (Pharmacist)  Recent office visits: 02/18/2020 (PCP): some decline in GFR then stabilized on repeat labs, encouraged hydrated.   Hospital visits: None in previous 6 months  Objective: Lab Results  Component Value Date   CREATININE 1.17 (H) 11/24/2020   CREATININE 1.39 (H) 11/20/2020   CREATININE 1.39 (H) 11/11/2020   GFR 33.88 (L) 11/11/2020   GFR 33.05 (L) 09/29/2020   GFRNONAA 45 (L) 11/24/2020   GFRNONAA 36 (L) 11/20/2020  Last diabetic Eye exam: No results found for: HMDIABEYEEXA  Last diabetic Foot exam: No results found for: HMDIABFOOTEX  Lab Results  Component  Value Date   CHOL 180 07/16/2020   CHOL 181 03/13/2020   TRIG 112.0 07/16/2020   TRIG 118.0 03/13/2020   HDL 63.70 07/16/2020   HDL 58.60 03/13/2020   CHOLHDL 3 07/16/2020   CHOLHDL 3 03/13/2020   VLDL 22.4 07/16/2020   VLDL 23.6 03/13/2020   LDLCALC 94 07/16/2020   LDLCALC 99 03/13/2020   LDLDIRECT 124.0 09/14/2015   LDLDIRECT 195.4 02/14/2013   Hepatic Function Latest Ref Rng & Units 09/29/2020 07/16/2020 03/13/2020  Total Protein 6.0 - 8.3 g/dL 6.5 6.8 6.9  Albumin 3.5 - 5.2 g/dL 4.1 4.2 4.0  AST 0 - 37 U/L _0 ALT 0 - 35 U/L _1 Alk Phosphatase 39 - 117 U/L 77 72 73  Total Bilirubin 0.2 - 1.2 mg/dL 0.4 0.4 0.4  Bilirubin, Direct 0.0 - 0.3 mg/dL - 0.1 0.1   Lab Results  Component Value Date/Time   TSH 1.48 09/29/2020 04:10 PM   TSH 0.59 07/16/2020 10:38 AM   FREET4 1.14 10/25/2018 01:42 PM   FREET4 1.43 03/24/2018 08:48 AM   CBC Latest Ref Rng & Units 11/26/2020 11/25/2020 11/24/2020  WBC 4.0 - 10.5 K/uL 12.1(H) 13.1(H) 13.1(H)  Hemoglobin 12.0 - 15.0 g/dL 8.0(L) 8.5(L) 9.9(L)  Hematocrit 36.0 - 46.0 % 23.5(L) 26.3(L) 30.9(L)  Platelets 150 - 400 K/uL 160 181 195   Lab Results  Component Value Date/Time   VD25OH 39.83 03/09/2015 02:16 PM   VD25OH 42.61 08/16/2013 08:56 AM    Clinical ASCVD:  The ASCVD Risk score (Arnett DK, et al., 2019) failed to  calculate for the following reasons:   The 2019 ASCVD risk score is only valid for ages 68 to 29    Social History   Tobacco Use  Smoking Status Never  Smokeless Tobacco Never   BP Readings from Last 3 Encounters:  11/30/20 (!) 159/72  11/20/20 (!) 159/62  11/04/20 (!) 150/64   Pulse Readings from Last 3 Encounters:  11/30/20 75  11/20/20 70  11/04/20 70   Wt Readings from Last 3 Encounters:  11/23/20 182 lb (82.6 kg)  11/20/20 182 lb (82.6 kg)  11/04/20 183 lb 12.8 oz (83.4 kg)    Assessment: Review of patient past medical history, allergies, medications, health status, including review of  consultants reports, laboratory and other test data, was performed as part of comprehensive evaluation and provision of chronic care management services.   SDOH:  (Social Determinants of Health) assessments and interventions performed:   CCM Care Plan Allergies  Allergen Reactions   Penicillins Rash    Did it involve swelling of the face/tongue/throat, SOB, or low BP? No Did it involve sudden or severe rash/hives, skin peeling, or any reaction on the inside of your mouth or nose? Yes Did you need to seek medical attention at a hospital or doctor's office? No When did it last happen?      approx 2005 If all above answers are "NO", may proceed with cephalosporin use.    Medications Reviewed Today     Reviewed by Edythe Clarity, [facility] (Pharmacist) on 12/16/20 at [place] List Status: <None>   Medication Order Taking? Sig Documenting Provider Last Dose Status Informant  aspirin EC 81 MG tablet [id number] Yes Take 1 tablet (81 mg total) by mouth 2 (two) times daily. Leighton Parody, PA-C Taking Active   Camphor-Menthol-Methyl Sal [facility]) 3.02-06-08 % [facility] [id number] Yes Apply 1 patch topically daily as needed (pain). [provider] Taking Active Family Member  Coenzyme Q10 (CO Q10) 100 MG CAPS [id number] Yes Take 100 mg by mouth at bedtime. [provider] Taking Active Family Member  COVID-19 mRNA bivalent vaccine, [place], injection [id number] Yes Inject into the muscle. Carlyle Basques, MD Taking Active Family Member  famotidine (PEPCID) 20 MG tablet [id number] Yes TAKE 1 TABLET BY MOUTH  DAILY Midge Minium, MD Taking Active   FLUoxetine (PROZAC) 10 MG capsule [id number] Yes TAKE 1 CAPSULE BY MOUTH  DAILY Midge Minium, MD Taking Active   levothyroxine (SYNTHROID) 100 MCG tablet [id number] Yes TAKE 1 TABLET BY MOUTH  DAILY Midge Minium, MD Taking Active Family Member  Menthol, Topical Analgesic, (BIOFREEZE ROLL-ON) 4 % GEL [id number] Yes Apply 1 application  topically daily as needed (pain). [provider] Taking Active Family Member  Menthol, Topical Analgesic, (BLUE-EMU MAXIMUM STRENGTH) 2.5 % LIQD [id number] Yes Apply 1 application topically daily as needed (pain). [provider] Taking Active Family Member  Multiple Vitamin (MULTIVITAMIN WITH MINERALS) TABS tablet [id number] Yes Take 1 tablet by mouth at bedtime. [provider] Taking Active Family Member  oxyCODONE-acetaminophen (PERCOCET/ROXICET) 5-325 MG tablet [id number] Yes Take 1 tablet by mouth every 4 (four) hours as needed for severe pain. Leighton Parody, PA-C Taking Active   pantoprazole (PROTONIX) 40 MG tablet [id number] Yes Take 1 tablet (40 mg total) by mouth daily. Midge Minium, MD Taking Active Family Member  pravastatin (PRAVACHOL) 80 MG tablet [id number] Yes TAKE 1 TABLET BY MOUTH  DAILY Midge Minium, MD Taking Active   sodium chloride (OCEAN) 0.65 % SOLN  nasal spray [id number] Yes Place 2 sprays into both nostrils as needed for congestion. [provider] Taking Active Family Member  tiZANidine (ZANAFLEX) 2 MG tablet [id number] Yes Take 1 tablet (2 mg total) by mouth every 6 (six) hours as needed. Leighton Parody, PA-C Taking Active            Patient Active Problem List   Diagnosis Date Noted   Arthritis of right knee 11/23/2020   Osteoarthritis of right knee 11/19/2020   Incomplete left bundle branch block (LBBB) 10/08/2020   Nonrheumatic aortic valve insufficiency 10/08/2020   Obesity (BMI 30-39.9) 01/07/2019   Abnormal EKG 03/23/2018   Dizziness 03/23/2018   Gait instability 03/23/2018   Bradycardia 03/23/2018   Primary osteoarthritis of left knee 01/09/2017   Degenerative arthritis of left knee 01/05/2017   Elevated blood pressure reading in office without diagnosis of hypertension 03/09/2015   Lumbar back pain 05/22/2014   Uterine prolapse 02/19/2014   Hypothyroidism following radioiodine therapy 07/26/2011    Elevated glucose 04/07/2011   Multinodular goiter 12/10/2010   Knee pain, bilateral 11/25/2010   Depression 11/25/2010   Heart murmur 11/25/2010   Physical exam 07/14/2010   GERD (gastroesophageal reflux disease) 06/15/2010   Hyperlipidemia 06/15/2010   Immunization History  Administered Date(s) Administered   Fluad Quad(high Dose 65+) 10/02/2018, 09/29/2020   Influenza Split 11/25/2010, 11/16/2011   Influenza,inj,Quad PF,6+ Mos 12/06/2012, 11/07/2013, 10/23/2014, 11/26/2015, 09/20/2016, 09/30/2017   Influenza-Unspecified 10/15/2019   PFIZER Comirnaty(Gray Top)Covid-19 Tri-Sucrose Vaccine 06/15/2020   PFIZER(Purple Top)SARS-COV-2 Vaccination 02/10/2019, 03/03/2019, 10/15/2019   Pfizer Covid-19 Vaccine Bivalent Booster 14yr & up 10/27/2020   Pneumococcal Conjugate-13 02/19/2014   Pneumococcal Polysaccharide-23 03/10/2016   Tdap 07/15/2019   Zoster Recombinat (Shingrix) 04/28/2017, 09/30/2017    Conditions to be addressed/monitored: MDD HLD Hypothyroidism (following RAI) GERD OA  Care Plan : CCM Pharmacy Care Plan  Updates made by DEdythe Clarity RPH since 12/16/2020 12:00 AM     Problem: MDD HLD Hypothyroidism (following RAI) GERD OA   Priority: High     Long-Range Goal: Disease Management   Start Date: 05/28/2020  Expected End Date: 05/28/2021  Recent Progress: On track  Priority: High  Note:   Pharmacist Clinical Goal(s):  Over the next 365 days, patient will contact provider office for questions/concerns as evidenced notation of same in electronic health record through collaboration with PharmD and provider.   Interventions: 1:1 collaboration with TMidge Minium MD regarding development and update of comprehensive plan of care as evidenced by provider attestation and co-signature Inter-disciplinary care team collaboration (see longitudinal plan of care) Comprehensive medication review performed; medication list updated in electronic medical  record  Hyperlipidemia: (LDL goal < 100) -Controlled -labs have been stable, low CV risk -Current treatment: Pravastatin 80 mg once daily  -Current dietary patterns: no changes  -Current exercise habits: trying to stay active outdoors, needing  -Educated on Benefits of statin for ASCVD risk reduction; Importance of limiting foods high in cholesterol; -Counseled on diet and exercise extensively Recommended to continue current medication  Update 12/16/20 Has upcoming appt in December for lipid recheck LDL controlled at Feb visit.  Reports adherence with statin, no concerns for adverse effects at this time. Recheck lipids in Dec - continue current dose.  Depression (Goal: minimize symptoms) -Controlled per patient report. Has not been as involved in the community as she would like due to pandemic, arthritis related knee ain limiting level of activity and states that she plans to reach out to otho and  does not need referral.  -PHQ9 = 0  -Feels good about control of depression  -Current treatment  Fluoxetine 10 mg once daily  -No side effect concerns at this time   -Counseled on diet and exercise extensively  GERD (minimize symptoms, avoid triggers) -Controlled -Still occasionally having symptoms ~1x/week - is happy with control. Certain meats will provoke reflux, tries not eat late at night. -Current treatment  Pantoprazole 43m daily -Reviewed triggers today - no additional issues noted -Recommend continue medication   Update 12/16/20 Now taking Protonix for GERD symptoms, she states this has improved her symptoms.  Confirmed she was taking daily at appropriate time. Work on [employer]  She is pleased with improvement since starting this med. No changes - continue as current.  Patient Goals/Self-Care Activities - take medications as prescribed target a minimum of 150 minutes of moderate intensity exercise weekly  Medication Assistance: None required.  Patient affirms  current coverage meets needs.  Patient's preferred pharmacy [facility]:  [id number] [place], [place]one: [phone]: [phone] ValleyoMayfieldSuite [address] Ca[place]hone: [phone]: [id number]C [place]one: [phone]: [phone] pill box? Yes. Pt endorses 100% compliance  Follow Up:  Patient agrees to Care Plan and Follow-up. Plan: [facility] call 6 months.         Future Appointments  Date Time Provider DepTraill2/19/2022 10:30 AM TabMidge MiniumD [facility] PECSurf CityharmD Clinical Pharmacist Practitioner [phone]

## 2020-12-16 ENCOUNTER — Ambulatory Visit (INDEPENDENT_AMBULATORY_CARE_PROVIDER_SITE_OTHER): Payer: Medicare Other | Admitting: Pharmacist

## 2020-12-16 DIAGNOSIS — Z96651 Presence of right artificial knee joint: Secondary | ICD-10-CM | POA: Diagnosis not present

## 2020-12-16 DIAGNOSIS — E785 Hyperlipidemia, unspecified: Secondary | ICD-10-CM

## 2020-12-16 DIAGNOSIS — K219 Gastro-esophageal reflux disease without esophagitis: Secondary | ICD-10-CM

## 2020-12-16 NOTE — Patient Instructions (Addendum)
Visit Information   Goals Addressed             This Visit's Progress    Track and Manage My Blood Pressure-Hypertension       Timeframe:  Long-Range Goal Priority:  High Start Date: 12/16/20                            Expected End Date:  06/15/21                     Follow Up Date 03/18/21    - check blood pressure weekly - choose a place to take my blood pressure (home, clinic or office, retail store) - write blood pressure results in a log or diary    Why is this important?   You won't feel high blood pressure, but it can still hurt your blood vessels.  High blood pressure can cause heart or kidney problems. It can also cause a stroke.  Making lifestyle changes like losing a little weight or eating less salt will help.  Checking your blood pressure at home and at different times of the day can help to control blood pressure.  If the doctor prescribes medicine remember to take it the way the doctor ordered.  Call the office if you cannot afford the medicine or if there are questions about it.     Notes:        Patient Care Plan: CCM Pharmacy Care Plan     Problem Identified: MDD HLD Hypothyroidism (following RAI) GERD OA   Priority: High     Long-Range Goal: Disease Management   Start Date: 05/28/2020  Expected End Date: 05/28/2021  Recent Progress: On track  Priority: High  Note:   Pharmacist Clinical Goal(s):  Over the next 365 days, patient will contact provider office for questions/concerns as evidenced notation of same in electronic health record through collaboration with PharmD and provider.   Interventions: 1:1 collaboration with Sheliah Hatch, MD regarding development and update of comprehensive plan of care as evidenced by provider attestation and co-signature Inter-disciplinary care team collaboration (see longitudinal plan of care) Comprehensive medication review performed; medication list updated in electronic medical record  Hyperlipidemia:  (LDL goal < 100) -Controlled -labs have been stable, low CV risk -Current treatment: Pravastatin 80 mg once daily  -Current dietary patterns: no changes  -Current exercise habits: trying to stay active outdoors, needing  -Educated on Benefits of statin for ASCVD risk reduction; Importance of limiting foods high in cholesterol; -Counseled on diet and exercise extensively Recommended to continue current medication  Update 12/16/20 Has upcoming appt in December for lipid recheck LDL controlled at Feb visit.  Reports adherence with statin, no concerns for adverse effects at this time. Recheck lipids in Dec - continue current dose.  Depression (Goal: minimize symptoms) -Controlled per patient report. Has not been as involved in the community as she would like due to pandemic, arthritis related knee ain limiting level of activity and states that she plans to reach out to otho and does not need referral.  -PHQ9 = 0  -Feels good about control of depression  -Current treatment  Fluoxetine 10 mg once daily  -No side effect concerns at this time   -Counseled on diet and exercise extensively  GERD (minimize symptoms, avoid triggers) -Controlled -Still occasionally having symptoms ~1x/week - is happy with control. Certain meats will provoke reflux, tries not eat late at night. -Current treatment  Pantoprazole 40mg  daily -Reviewed triggers today - no additional issues noted -Recommend continue medication   Update 12/16/20 Now taking Protonix for GERD symptoms, she states this has improved her symptoms.  Confirmed she was taking daily at appropriate time. Work on 12/18/20.  She is pleased with improvement since starting this med. No changes - continue as current.  Patient Goals/Self-Care Activities - take medications as prescribed target a minimum of 150 minutes of moderate intensity exercise weekly  Medication Assistance: None required.  Patient affirms current coverage meets  needs.  Patient's preferred pharmacy is:  [id number] [employer], [place] - [address] [place] Phone: [phone] Fax: [phone]  [facility] - [place], [place] - [place] [address] [place], [address] [place], [address] [place] Phone: [phone] Fax: [phone]  [id number] [phone], [place] - [place] [address] [place] Phone: [phone] Fax: [phone]  Uses pill box? Yes. Pt endorses 100% compliance  Follow Up:  Patient agrees to Care Plan and Follow-up. Plan: [facility] f/u telephone call 6 months.         Patient verbalizes understanding of instructions provided today and agrees to view in MyChart.  Telephone follow up appointment with pharmacy team member scheduled for: 6 months  [facility], [place]  [place]

## 2020-12-21 DIAGNOSIS — Z96651 Presence of right artificial knee joint: Secondary | ICD-10-CM | POA: Diagnosis not present

## 2020-12-22 DIAGNOSIS — Z96651 Presence of right artificial knee joint: Secondary | ICD-10-CM | POA: Diagnosis not present

## 2020-12-28 DIAGNOSIS — Z96651 Presence of right artificial knee joint: Secondary | ICD-10-CM | POA: Diagnosis not present

## 2020-12-30 DIAGNOSIS — E785 Hyperlipidemia, unspecified: Secondary | ICD-10-CM | POA: Diagnosis not present

## 2020-12-30 DIAGNOSIS — Z96651 Presence of right artificial knee joint: Secondary | ICD-10-CM | POA: Diagnosis not present

## 2021-01-01 DIAGNOSIS — Z96651 Presence of right artificial knee joint: Secondary | ICD-10-CM | POA: Diagnosis not present

## 2021-01-05 DIAGNOSIS — Z96651 Presence of right artificial knee joint: Secondary | ICD-10-CM | POA: Diagnosis not present

## 2021-01-07 DIAGNOSIS — Z96651 Presence of right artificial knee joint: Secondary | ICD-10-CM | POA: Diagnosis not present

## 2021-01-10 ENCOUNTER — Other Ambulatory Visit: Payer: Self-pay | Admitting: Family Medicine

## 2021-01-12 DIAGNOSIS — Z96651 Presence of right artificial knee joint: Secondary | ICD-10-CM | POA: Diagnosis not present

## 2021-01-14 DIAGNOSIS — Z96651 Presence of right artificial knee joint: Secondary | ICD-10-CM | POA: Diagnosis not present

## 2021-01-18 ENCOUNTER — Encounter: Payer: Self-pay | Admitting: Family Medicine

## 2021-01-18 ENCOUNTER — Ambulatory Visit (INDEPENDENT_AMBULATORY_CARE_PROVIDER_SITE_OTHER): Payer: Medicare Other | Admitting: Family Medicine

## 2021-01-18 VITALS — BP 132/82 | HR 85 | Temp 98.4°F | Resp 16 | Wt 177.8 lb

## 2021-01-18 DIAGNOSIS — E669 Obesity, unspecified: Secondary | ICD-10-CM | POA: Diagnosis not present

## 2021-01-18 DIAGNOSIS — E785 Hyperlipidemia, unspecified: Secondary | ICD-10-CM

## 2021-01-18 DIAGNOSIS — E890 Postprocedural hypothyroidism: Secondary | ICD-10-CM | POA: Diagnosis not present

## 2021-01-18 DIAGNOSIS — M19019 Primary osteoarthritis, unspecified shoulder: Secondary | ICD-10-CM

## 2021-01-18 LAB — CBC WITH DIFFERENTIAL/PLATELET
Basophils Absolute: 0 10*3/uL (ref 0.0–0.1)
Basophils Relative: 0.5 % (ref 0.0–3.0)
Eosinophils Absolute: 0.1 10*3/uL (ref 0.0–0.7)
Eosinophils Relative: 1.2 % (ref 0.0–5.0)
HCT: 33.4 % — ABNORMAL LOW (ref 36.0–46.0)
Hemoglobin: 11.3 g/dL — ABNORMAL LOW (ref 12.0–15.0)
Lymphocytes Relative: 22.9 % (ref 12.0–46.0)
Lymphs Abs: 1.7 10*3/uL (ref 0.7–4.0)
MCHC: 33.7 g/dL (ref 30.0–36.0)
MCV: 91 fl (ref 78.0–100.0)
Monocytes Absolute: 0.5 10*3/uL (ref 0.1–1.0)
Monocytes Relative: 7.1 % (ref 3.0–12.0)
Neutro Abs: 5.1 10*3/uL (ref 1.4–7.7)
Neutrophils Relative %: 68.3 % (ref 43.0–77.0)
Platelets: 220 10*3/uL (ref 150.0–400.0)
RBC: 3.67 Mil/uL — ABNORMAL LOW (ref 3.87–5.11)
RDW: 14.8 % (ref 11.5–15.5)
WBC: 7.5 10*3/uL (ref 4.0–10.5)

## 2021-01-18 LAB — TSH: TSH: 1.7 u[IU]/mL (ref 0.35–5.50)

## 2021-01-18 MED ORDER — GLUCERNA SHAKE PO LIQD: 237.0000 mL | Freq: Two times a day (BID) | ORAL | 1 refills | Status: AC

## 2021-01-18 MED ORDER — DICLOFENAC SODIUM 1 % EX GEL: 4.0000 g | Freq: Four times a day (QID) | CUTANEOUS | 1 refills | Status: DC

## 2021-01-18 NOTE — Progress Notes (Signed)
° °  Subjective:    Patient ID: Tonya Johns, female    DOB: 1930-03-22, 87 y.o.   MRN: [id number]  HPI Hyperlipidemia- chronic problem, on Pravastatin 80mg  daily.  No CP, SOB, abd pain, N/V.  Hypothyroid- chronic problem, on Levothyroxine daily.  Decreased energy recently.  No changes to skin/hair/nails  Obesity- Pt is down 6 lbs since last visit.  BMI now 30.52  Pt reports 'i eat a little bit and then I get full'.  Trying to drink glucerna when she's not hungry.  Asking for prescription.  Bilateral shoulder arthritis- not currently taking anything   Review of Systems For ROS see HPI   This visit occurred during the SARS-CoV-2 public health emergency.  Safety protocols were in place, including screening questions prior to the visit, additional usage of staff PPE, and extensive cleaning of exam room while observing appropriate contact time as indicated for disinfecting solutions.      Objective:   Physical Exam Vitals reviewed.  Constitutional:      General: She is not in acute distress.    Appearance: Normal appearance. She is well-developed. She is not ill-appearing.  HENT:     Head: Normocephalic and atraumatic.  Eyes:     Conjunctiva/sclera: Conjunctivae normal.     Pupils: Pupils are equal, round, and reactive to light.  Neck:     Thyroid: No thyromegaly.  Cardiovascular:     Rate and Rhythm: Normal rate and regular rhythm.     Heart sounds: Normal heart sounds. No murmur heard. Pulmonary:     Effort: Pulmonary effort is normal. No respiratory distress.     Breath sounds: Normal breath sounds.  Abdominal:     General: There is no distension.     Palpations: Abdomen is soft.     Tenderness: There is no abdominal tenderness.  Musculoskeletal:     Cervical back: Normal range of motion and neck supple.  Lymphadenopathy:     Cervical: No cervical adenopathy.  Skin:    General: Skin is warm and dry.  Neurological:     General: No focal deficit present.     Mental  Status: She is alert and oriented to person, place, and time.  Psychiatric:        Mood and Affect: Mood normal.        Behavior: Behavior normal.          Assessment & Plan:  Shoulder arthritis- bilateral.  Start Voltaren gel for symptom relief.

## 2021-01-18 NOTE — Patient Instructions (Signed)
Schedule your complete physical in 6 months We'll notify you of your lab results and make any changes if needed USE the Voltaren (Diclofenac) Gel on your sore joints I sent the Glucerna prescription to [facility] Order [facility] [address] Call with any questions or concerns Stay Safe!  Stay Healthy! Happy Holidays!

## 2021-01-19 DIAGNOSIS — Z96651 Presence of right artificial knee joint: Secondary | ICD-10-CM | POA: Diagnosis not present

## 2021-01-19 LAB — LIPID PANEL
Cholesterol: 214 mg/dL — ABNORMAL HIGH (ref 0–200)
HDL: 58.2 mg/dL (ref >39.00)
LDL Cholesterol: 130 mg/dL — ABNORMAL HIGH (ref 0–99)
NonHDL: 155.9
Total CHOL/HDL Ratio: 4
Triglycerides: 132 mg/dL (ref 0.0–149.0)
VLDL: 26.4 mg/dL (ref 0.0–40.0)

## 2021-01-19 LAB — HEPATIC FUNCTION PANEL
ALT: 7 U/L (ref 0–35)
AST: 15 U/L (ref 0–37)
Albumin: 4 g/dL (ref 3.5–5.2)
Alkaline Phosphatase: 84 U/L (ref 39–117)
Bilirubin, Direct: 0.1 mg/dL (ref 0.0–0.3)
Total Bilirubin: 0.3 mg/dL (ref 0.2–1.2)
Total Protein: 6.7 g/dL (ref 6.0–8.3)

## 2021-01-19 LAB — BASIC METABOLIC PANEL
BUN: 16 mg/dL (ref 6–23)
CO2: 23 mEq/L (ref 19–32)
Calcium: 9.7 mg/dL (ref 8.4–10.5)
Chloride: 105 mEq/L (ref 96–112)
Creatinine, Ser: 1.37 mg/dL — ABNORMAL HIGH (ref 0.40–1.20)
GFR: 34.43 mL/min — ABNORMAL LOW (ref >60.00)
Glucose, Bld: 92 mg/dL (ref 70–99)
Potassium: 4 mEq/L (ref 3.5–5.1)
Sodium: 140 mEq/L (ref 135–145)

## 2021-01-21 ENCOUNTER — Telehealth: Payer: Self-pay

## 2021-01-21 ENCOUNTER — Other Ambulatory Visit: Payer: Self-pay

## 2021-01-21 ENCOUNTER — Encounter: Payer: Self-pay | Admitting: Family Medicine

## 2021-01-21 DIAGNOSIS — Z96651 Presence of right artificial knee joint: Secondary | ICD-10-CM | POA: Diagnosis not present

## 2021-01-21 NOTE — Telephone Encounter (Signed)
-----   Message from Sheliah Hatch, MD sent at 01/19/2021 12:36 PM EST ----- Labs are stable and look good w/ exception of total cholesterol and LDL (bad cholesterol)- both of which have gone up.  Make sure you are taking your Pravastatin daily and working on a healthy diet and regular physical activity.

## 2021-01-21 NOTE — Telephone Encounter (Signed)
Patient is aware of labs °

## 2021-01-27 DIAGNOSIS — Z96651 Presence of right artificial knee joint: Secondary | ICD-10-CM | POA: Diagnosis not present

## 2021-02-14 NOTE — Assessment & Plan Note (Signed)
Chronic problem.  Complains of decreased energy recently.  Unclear if this is due to thyroid or aging.  Check labs.  Adjust meds prn

## 2021-02-14 NOTE — Assessment & Plan Note (Signed)
Pt is down 6 lbs since last visit.  She reports her appetite is less and she feels full more quickly.  Will drink Glucerna when not hungry.  Prescription for Glucerna sent to mail order.  Will follow.

## 2021-02-14 NOTE — Assessment & Plan Note (Signed)
Chronic problem.  Currently tolerating pravastatin 80mg  daily w/o difficulty.  Check labs.  Adjust meds prn

## 2021-03-03 ENCOUNTER — Telehealth: Payer: Self-pay | Admitting: Pharmacist

## 2021-03-03 NOTE — Progress Notes (Signed)
Chronic Care Management Pharmacy Assistant   Name: Tonya Johns  MRN: [id number] DOB: 1930/02/26   Reason for Encounter: Disease State - Hypertension Call     Recent office visits:  01/18/21 Neena Rhymes, MD - Family Medicine - Hyperlipidemia - Labs were ordered. diclofenac Sodium (VOLTAREN) 1 % GEL and feeding supplement, GLUCERNA SHAKE, (GLUCERNA SHAKE) LIQD were prescribed. Follow up for Physical in 6 months.   Recent consult visits:  None noted.  Hospital visits: 11/23/20-11/30/20 Medication Reconciliation was completed by comparing discharge summary, patients EMR and Pharmacy list, and upon discussion with patient.  Admitted to the hospital on 11/23/20 due to arthritis of right knee. Discharge date was 11/30/20. Discharged from [facility].    New?Medications Started at [facility] Discharge:?? aspirin EC oxyCODONE-acetaminophen (PERCOCET/ROXICET) tiZANidine (ZANAFLEX)  Medication Changes at Hospital Discharge: None noted.  Medications Discontinued at Hospital Discharge: acetaminophen 500 MG tablet (TYLENOL)  Medications that remain the same after Hospital Discharge:??  All other medications will remain the same.    Medications: Outpatient Encounter Medications as of 03/03/2021  Medication Sig   aspirin EC 81 MG tablet Take 1 tablet (81 mg total) by mouth 2 (two) times daily.   Camphor-Menthol-Methyl Sal (SALONPAS) 3.02-06-08 % PTCH Apply 1 patch topically daily as needed (pain).   Coenzyme Q10 (CO Q10) 100 MG CAPS Take 100 mg by mouth at bedtime.   famotidine (PEPCID) 20 MG tablet TAKE 1 TABLET BY MOUTH  DAILY   feeding supplement, GLUCERNA SHAKE, (GLUCERNA SHAKE) LIQD Take 237 mLs by mouth 2 (two) times daily.   FLUoxetine (PROZAC) 10 MG capsule TAKE 1 CAPSULE BY MOUTH  DAILY   levothyroxine (SYNTHROID) 100 MCG tablet TAKE 1 TABLET BY MOUTH  DAILY   Menthol, Topical Analgesic, (BIOFREEZE ROLL-ON) 4 % GEL Apply 1 application topically daily as needed (pain).    Menthol, Topical Analgesic, (BLUE-EMU MAXIMUM STRENGTH) 2.5 % LIQD Apply 1 application topically daily as needed (pain).   Multiple Vitamin (MULTIVITAMIN WITH MINERALS) TABS tablet Take 1 tablet by mouth at bedtime.   pravastatin (PRAVACHOL) 80 MG tablet TAKE 1 TABLET BY MOUTH  DAILY   No facility-administered encounter medications on file as of 03/03/2021.    Current antihypertensive regimen:  None  How often are you checking your Blood Pressure?  Patient reported she has not been checking it because it was good the last time she went to the doctor.    Current home BP readings: Patient did not have any to report at this time.    What recent interventions/DTPs have been made by any provider to improve Blood Pressure control since last CPP Visit:  Patient reported she has not had any changes recently to her current medication regimen.    Any recent hospitalizations or ED visits since last visit with CPP?  Patient has had an hospitalization for knee replacement 11/23/20 - 11/30/20.   What diet changes have been made to improve Blood Pressure Control?  Patient reported limiting her salt intake in her diet.    What exercise is being done to improve your Blood Pressure Control?  Patient reported she is no longer in PT but does walk daily.     Adherence Review: Is the patient currently on ACE/ARB medication? No Does the patient have >5 day gap between last estimated fill dates? No   Care Gaps  AWV: done 08/17/20 Colonoscopy: done 01/31/10 DM Eye Exam: N/A DM Foot Exam:  N/A Microalbumin: unknown HbgAIC: done 11/11/20 (6.0) DEXA: done 09/12/13 Mammogram:  done 09/24/15   Star Rating Drugs: pravastatin (PRAVACHOL) 80 MG tablet - last filled 10/10/20 90 days   Future Appointments  Date Time Provider Department Center  06/16/2021  3:00 PM [facility] PHARMACIST [facility]  07/20/2021 10:00 AM Sheliah Hatch, MD [facility]    Eugenie Filler, [facility] Clinical Pharmacist  Assistant  [phone]

## 2021-06-16 ENCOUNTER — Telehealth: Payer: Medicare Other

## 2021-07-20 ENCOUNTER — Encounter: Payer: Medicare Other | Admitting: Family Medicine

## 2021-07-23 ENCOUNTER — Ambulatory Visit (INDEPENDENT_AMBULATORY_CARE_PROVIDER_SITE_OTHER): Payer: Medicare Other | Admitting: Family Medicine

## 2021-07-23 ENCOUNTER — Encounter: Payer: Self-pay | Admitting: Family Medicine

## 2021-07-23 VITALS — BP 120/62 | HR 59 | Temp 97.8°F | Resp 16 | Ht 63.0 in | Wt 177.4 lb

## 2021-07-23 DIAGNOSIS — E669 Obesity, unspecified: Secondary | ICD-10-CM

## 2021-07-23 DIAGNOSIS — Z0000 Encounter for general adult medical examination without abnormal findings: Secondary | ICD-10-CM | POA: Diagnosis not present

## 2021-07-23 LAB — HEPATIC FUNCTION PANEL
ALT: 11 U/L (ref 0–35)
AST: 18 U/L (ref 0–37)
Albumin: 4 g/dL (ref 3.5–5.2)
Alkaline Phosphatase: 82 U/L (ref 39–117)
Bilirubin, Direct: 0.1 mg/dL (ref 0.0–0.3)
Total Bilirubin: 0.3 mg/dL (ref 0.2–1.2)
Total Protein: 6.9 g/dL (ref 6.0–8.3)

## 2021-07-23 LAB — LIPID PANEL
Cholesterol: 187 mg/dL (ref 0–200)
HDL: 62.1 mg/dL (ref >39.00)
LDL Cholesterol: 93 mg/dL (ref 0–99)
NonHDL: 124.65
Total CHOL/HDL Ratio: 3
Triglycerides: 160 mg/dL — ABNORMAL HIGH (ref 0.0–149.0)
VLDL: 32 mg/dL (ref 0.0–40.0)

## 2021-07-23 LAB — CBC WITH DIFFERENTIAL/PLATELET
Basophils Absolute: 0 10*3/uL (ref 0.0–0.1)
Basophils Relative: 0.7 % (ref 0.0–3.0)
Eosinophils Absolute: 0.1 10*3/uL (ref 0.0–0.7)
Eosinophils Relative: 1.2 % (ref 0.0–5.0)
HCT: 35.7 % — ABNORMAL LOW (ref 36.0–46.0)
Hemoglobin: 12 g/dL (ref 12.0–15.0)
Lymphocytes Relative: 25.6 % (ref 12.0–46.0)
Lymphs Abs: 1.7 10*3/uL (ref 0.7–4.0)
MCHC: 33.5 g/dL (ref 30.0–36.0)
MCV: 91.4 fl (ref 78.0–100.0)
Monocytes Absolute: 0.4 10*3/uL (ref 0.1–1.0)
Monocytes Relative: 5.2 % (ref 3.0–12.0)
Neutro Abs: 4.6 10*3/uL (ref 1.4–7.7)
Neutrophils Relative %: 67.3 % (ref 43.0–77.0)
Platelets: 202 10*3/uL (ref 150.0–400.0)
RBC: 3.91 Mil/uL (ref 3.87–5.11)
RDW: 13.5 % (ref 11.5–15.5)
WBC: 6.8 10*3/uL (ref 4.0–10.5)

## 2021-07-23 LAB — TSH: TSH: 1.39 u[IU]/mL (ref 0.35–5.50)

## 2021-07-23 LAB — BASIC METABOLIC PANEL
BUN: 20 mg/dL (ref 6–23)
CO2: 30 mEq/L (ref 19–32)
Calcium: 9.7 mg/dL (ref 8.4–10.5)
Chloride: 102 mEq/L (ref 96–112)
Creatinine, Ser: 1.39 mg/dL — ABNORMAL HIGH (ref 0.40–1.20)
GFR: 33.72 mL/min — ABNORMAL LOW (ref >60.00)
Glucose, Bld: 111 mg/dL — ABNORMAL HIGH (ref 70–99)
Potassium: 4.1 mEq/L (ref 3.5–5.1)
Sodium: 139 mEq/L (ref 135–145)

## 2021-07-23 LAB — VITAMIN D 25 HYDROXY (VIT D DEFICIENCY, FRACTURES): VITD: 44.57 ng/mL (ref 30.00–100.00)

## 2021-07-23 NOTE — Assessment & Plan Note (Signed)
Chronic problem.  Ambulates w/ a cane so very limited exercise.  Encouraged healthy diet.  Check labs to risk stratify.  Will follow.

## 2021-08-18 ENCOUNTER — Ambulatory Visit (INDEPENDENT_AMBULATORY_CARE_PROVIDER_SITE_OTHER): Payer: Medicare Other

## 2021-08-18 DIAGNOSIS — Z0000 Encounter for general adult medical examination without abnormal findings: Secondary | ICD-10-CM

## 2021-08-18 NOTE — Patient Instructions (Signed)
Tonya Johns , Thank you for taking time to come for your Medicare Wellness Visit. I appreciate your ongoing commitment to your health goals. Please review the following plan we discussed and let me know if I can assist you in the future.   Screening recommendations/referrals: Colonoscopy: no longer required Mammogram: no longer required  Bone Density: declined  Recommended yearly ophthalmology/optometry visit for glaucoma screening and checkup Recommended yearly dental visit for hygiene and checkup  Vaccinations: Influenza vaccine: completed  Pneumococcal vaccine: completed  Tdap vaccine: 07/15/2019 Shingles vaccine: completed     Advanced directives: yes  Conditions/risks identified: none   Next appointment: none    Preventive Care 65 Years and Older, Female Preventive care refers to lifestyle choices and visits with your health care provider that can promote health and wellness. What does preventive care include? A yearly physical exam. This is also called an annual well check. Dental exams once or twice a year. Routine eye exams. Ask your health care provider how often you should have your eyes checked. Personal lifestyle choices, including: Daily care of your teeth and gums. Regular physical activity. Eating a healthy diet. Avoiding tobacco and drug use. Limiting alcohol use. Practicing safe sex. Taking low-dose aspirin every day. Taking vitamin and mineral supplements as recommended by your health care provider. What happens during an annual well check? The services and screenings done by your health care provider during your annual well check will depend on your age, overall health, lifestyle risk factors, and family history of disease. Counseling  Your health care provider may ask you questions about your: Alcohol use. Tobacco use. Drug use. Emotional well-being. Home and relationship well-being. Sexual activity. Eating habits. History of falls. Memory and  ability to understand (cognition). Work and work Astronomer. Reproductive health. Screening  You may have the following tests or measurements: Height, weight, and BMI. Blood pressure. Lipid and cholesterol levels. These may be checked every 5 years, or more frequently if you are over 55 years old. Skin check. Lung cancer screening. You may have this screening every year starting at age 33 if you have a 30-pack-year history of smoking and currently smoke or have quit within the past 15 years. Fecal occult blood test (FOBT) of the stool. You may have this test every year starting at age 34. Flexible sigmoidoscopy or colonoscopy. You may have a sigmoidoscopy every 5 years or a colonoscopy every 10 years starting at age 78. Hepatitis C blood test. Hepatitis B blood test. Sexually transmitted disease (STD) testing. Diabetes screening. This is done by checking your blood sugar (glucose) after you have not eaten for a while (fasting). You may have this done every 1-3 years. Bone density scan. This is done to screen for osteoporosis. You may have this done starting at age 53. Mammogram. This may be done every 1-2 years. Talk to your health care provider about how often you should have regular mammograms. Talk with your health care provider about your test results, treatment options, and if necessary, the need for more tests. Vaccines  Your health care provider may recommend certain vaccines, such as: Influenza vaccine. This is recommended every year. Tetanus, diphtheria, and acellular pertussis (Tdap, Td) vaccine. You may need a Td booster every 10 years. Zoster vaccine. You may need this after age 43. Pneumococcal 13-valent conjugate (PCV13) vaccine. One dose is recommended after age 51. Pneumococcal polysaccharide (PPSV23) vaccine. One dose is recommended after age 17. Talk to your health care provider about which screenings and vaccines you  need and how often you need them. This information is  not intended to replace advice given to you by your health care provider. Make sure you discuss any questions you have with your health care provider. Document Released: 02/13/2015 Document Revised: 10/07/2015 Document Reviewed: 11/18/2014 Elsevier Interactive Patient Education  2017 [employer] Prevention in the Home Falls can cause injuries. They can happen to people of all ages. There are many things you can do to make your home safe and to help prevent falls. What can I do on the outside of my home? Regularly fix the edges of walkways and driveways and fix any cracks. Remove anything that might make you trip as you walk through a door, such as a raised step or threshold. Trim any bushes or trees on the path to your home. Use bright outdoor lighting. Clear any walking paths of anything that might make someone trip, such as rocks or tools. Regularly check to see if handrails are loose or broken. Make sure that both sides of any steps have handrails. Any raised decks and porches should have guardrails on the edges. Have any leaves, snow, or ice cleared regularly. Use sand or salt on walking paths during winter. Clean up any spills in your garage right away. This includes oil or grease spills. What can I do in the bathroom? Use night lights. Install grab bars by the toilet and in the tub and shower. Do not use towel bars as grab bars. Use non-skid mats or decals in the tub or shower. If you need to sit down in the shower, use a plastic, non-slip stool. Keep the floor dry. Clean up any water that spills on the floor as soon as it happens. Remove soap buildup in the tub or shower regularly. Attach bath mats securely with double-sided non-slip rug tape. Do not have throw rugs and other things on the floor that can make you trip. What can I do in the bedroom? Use night lights. Make sure that you have a light by your bed that is easy to reach. Do not use any sheets or blankets that  are too big for your bed. They should not hang down onto the floor. Have a firm chair that has side arms. You can use this for support while you get dressed. Do not have throw rugs and other things on the floor that can make you trip. What can I do in the kitchen? Clean up any spills right away. Avoid walking on wet floors. Keep items that you use a lot in easy-to-reach places. If you need to reach something above you, use a strong step stool that has a grab bar. Keep electrical cords out of the way. Do not use floor polish or wax that makes floors slippery. If you must use wax, use non-skid floor wax. Do not have throw rugs and other things on the floor that can make you trip. What can I do with my stairs? Do not leave any items on the stairs. Make sure that there are handrails on both sides of the stairs and use them. Fix handrails that are broken or loose. Make sure that handrails are as long as the stairways. Check any carpeting to make sure that it is firmly attached to the stairs. Fix any carpet that is loose or worn. Avoid having throw rugs at the top or bottom of the stairs. If you do have throw rugs, attach them to the floor with carpet tape. Make sure that  you have a light switch at the top of the stairs and the bottom of the stairs. If you do not have them, ask someone to add them for you. What else can I do to help prevent falls? Wear shoes that: Do not have high heels. Have rubber bottoms. Are comfortable and fit you well. Are closed at the toe. Do not wear sandals. If you use a stepladder: Make sure that it is fully opened. Do not climb a closed stepladder. Make sure that both sides of the stepladder are locked into place. Ask someone to hold it for you, if possible. Clearly mark and make sure that you can see: Any grab bars or handrails. First and last steps. Where the edge of each step is. Use tools that help you move around (mobility aids) if they are needed. These  include: Canes. Walkers. Scooters. Crutches. Turn on the lights when you go into a dark area. Replace any light bulbs as soon as they burn out. Set up your furniture so you have a clear path. Avoid moving your furniture around. If any of your floors are uneven, fix them. If there are any pets around you, be aware of where they are. Review your medicines with your doctor. Some medicines can make you feel dizzy. This can increase your chance of falling. Ask your doctor what other things that you can do to help prevent falls. This information is not intended to replace advice given to you by your health care provider. Make sure you discuss any questions you have with your health care provider. Document Released: 11/13/2008 Document Revised: 06/25/2015 Document Reviewed: 02/21/2014 Elsevier Interactive Patient Education  2017 [employer].

## 2021-08-18 NOTE — Progress Notes (Signed)
Subjective:   Tonya Johns is a 88 y.o. female who presents for Medicare Annual (Subsequent) preventive examination.   I connected with Tonya Johns today by telephone and verified that I am speaking with the correct person using two identifiers. Location patient: home Location provider: work Persons participating in the virtual visit: patient, provider.   I discussed the limitations, risks, security and privacy concerns of performing an evaluation and management service by telephone and the availability of in person appointments. I also discussed with the patient that there may be a patient responsible charge related to this service. The patient expressed understanding and verbally consented to this telephonic visit.    Interactive audio and video telecommunications were attempted between this provider and patient, however failed, due to patient having technical difficulties OR patient did not have access to video capability.  We continued and completed visit with audio only.    Review of Systems     Cardiac Risk Factors include: advanced age (>68men, >5 women)     Objective:    Today's Vitals   There is no height or weight on file to calculate BMI.     08/18/2021    3:10 PM 11/23/2020    5:00 PM 11/23/2020    9:39 AM 11/20/2020    8:21 AM 08/17/2020    2:26 PM 08/13/2019   12:01 PM 07/13/2018    2:05 PM  Advanced Directives  Does Patient Have a Medical Advance Directive? Yes Yes Yes Yes Yes Yes Yes  Type of Estate agent of [place];Living will Healthcare Power of [place];Living will Healthcare Power of [place];Living will Healthcare Power of [place];Living will Healthcare Power of Attorney Living will;Healthcare Power of [employer] Power of [place];Living will  Does patient want to make changes to medical advance directive?  No - Patient declined No - Patient declined No - Patient declined     Copy of Healthcare Power of Attorney in Chart? No -  copy requested No - copy requested No - copy requested No - copy requested Yes - validated most recent copy scanned in chart (See row information) No - copy requested No - copy requested    Current Medications (verified) Outpatient Encounter Medications as of 08/18/2021  Medication Sig   aspirin EC 81 MG tablet Take 1 tablet (81 mg total) by mouth 2 (two) times daily.   Camphor-Menthol-Methyl Sal (SALONPAS) 3.02-06-08 % PTCH Apply 1 patch topically daily as needed (pain).   Coenzyme Q10 (CO Q10) 100 MG CAPS Take 100 mg by mouth at bedtime.   famotidine (PEPCID) 20 MG tablet TAKE 1 TABLET BY MOUTH  DAILY   feeding supplement, GLUCERNA SHAKE, (GLUCERNA SHAKE) LIQD Take 237 mLs by mouth 2 (two) times daily.   FLUoxetine (PROZAC) 10 MG capsule TAKE 1 CAPSULE BY MOUTH  DAILY   levothyroxine (SYNTHROID) 100 MCG tablet TAKE 1 TABLET BY MOUTH  DAILY   Menthol, Topical Analgesic, (BIOFREEZE ROLL-ON) 4 % GEL Apply 1 application topically daily as needed (pain).   Menthol, Topical Analgesic, (BLUE-EMU MAXIMUM STRENGTH) 2.5 % LIQD Apply 1 application topically daily as needed (pain).   Multiple Vitamin (MULTIVITAMIN WITH MINERALS) TABS tablet Take 1 tablet by mouth at bedtime.   pravastatin (PRAVACHOL) 80 MG tablet TAKE 1 TABLET BY MOUTH  DAILY   No facility-administered encounter medications on file as of 08/18/2021.    Allergies (verified) Penicillins   History: Past Medical History:  Diagnosis Date   AI (aortic insufficiency)    mild-moderate AI  Anemia    Arthritis    Diverticulitis    GERD (gastroesophageal reflux disease)    Hyperlipidemia    Hypothyroidism    Leg swelling    Osteoporosis    Past Surgical History:  Procedure Laterality Date   EYE SURGERY Bilateral    cataract removal   NO PAST SURGERIES     TOTAL KNEE ARTHROPLASTY Left 01/09/2017   Procedure: TOTAL KNEE ARTHROPLASTY;  Surgeon: Gean Birchwood, MD;  Location: [facility] OR;  Service: Orthopedics;  Laterality: Left;   TOTAL  KNEE ARTHROPLASTY Right 11/23/2020   Procedure: RIGHT TOTAL KNEE ARTHROPLASTY;  Surgeon: Gean Birchwood, MD;  Location: [facility];  Service: Orthopedics;  Laterality: Right;   Family History  Problem Relation Age of Onset   Alcohol abuse Brother    Alcohol abuse Sister    Arthritis Mother    Stroke Mother    Hypertension Mother    Arthritis Father    Heart disease Father    Hyperlipidemia Brother    Hyperlipidemia Sister    Sudden death Brother    Mental illness Sister    Diabetes Sister    Cancer Daughter        breast   Social History   Socioeconomic History   Marital status: Single    Spouse name: Not on file   Number of children: 3   Years of education: Not on file   Highest education level: Not on file  Occupational History   Occupation: retired  Tobacco Use   Smoking status: Never   Smokeless tobacco: Never  Vaping Use   Vaping Use: Never used  Substance and Sexual Activity   Alcohol use: No   Drug use: No   Sexual activity: Not Currently  Other Topics Concern   Not on file  Social History Narrative   Not on file   Social Determinants of Health   Financial Resource Strain: Low Risk  (08/18/2021)   Overall Financial Resource Strain (CARDIA)    Difficulty of Paying Living Expenses: Not hard at all  Food Insecurity: No Food Insecurity (08/18/2021)   Hunger Vital Sign    Worried About Running Out of Food in the Last Year: Never true    Ran Out of Food in the Last Year: Never true  Transportation Needs: No Transportation Needs (08/18/2021)   PRAPARE - Administrator, Civil Service (Medical): No    Lack of Transportation (Non-Medical): No  Physical Activity: Insufficiently Active (08/18/2021)   Exercise Vital Sign    Days of Exercise per Week: 2 days    Minutes of Exercise per Session: 20 min  Stress: Stress Concern Present (08/17/2020)   [employer] of Occupational Health - Occupational Stress Questionnaire    Feeling of Stress : To some extent   Social Connections: Moderately Integrated (08/18/2021)   Social Connection and Isolation Panel [NHANES]    Frequency of Communication with Friends and Family: Three times a week    Frequency of Social Gatherings with Friends and Family: Three times a week    Attends Religious Services: More than 4 times per year    Active Member of Clubs or Organizations: Yes    Attends Banker Meetings: More than 4 times per year    Marital Status: Widowed    Tobacco Counseling Counseling given: Not Answered   Clinical Intake:  Pre-visit preparation completed: Yes  Pain : No/denies pain     Nutritional Risks: None Diabetes: No  How often do you need  to have someone help you when you read instructions, pamphlets, or other written materials from your doctor or pharmacy?: 1 - Never What is the last grade level you completed in school?: GED  Diabetic?no  Interpreter Needed?: No  Information entered by :: L.Alyxander Kollmann,LPN   Activities of Daily Living    08/18/2021    3:13 PM 08/17/2021    5:39 PM  In your present state of health, do you have any difficulty performing the following activities:  Hearing? 0 0  Vision? 0 1  Difficulty concentrating or making decisions? 0 0  Walking or climbing stairs? 0 1  Dressing or bathing? 0 0  Doing errands, shopping? 0 1  Preparing Food and eating ? N N  Using the Toilet? N N  In the past six months, have you accidently leaked urine? N Y  Do you have problems with loss of bowel control? N N  Managing your Medications? N N  Managing your Finances? N N  Housekeeping or managing your Housekeeping? N Y    Patient Care Team: Sheliah Hatch, MD as PCP - General (Family Medicine) Jodelle Red, MD as PCP - Cardiology (Cardiology) Romero Belling, MD (Inactive) as Consulting Physician (Endocrinology) Gean Birchwood, MD as Consulting Physician (Orthopedic Surgery) Erroll Luna, [facility] (Pharmacist)  Indicate any recent Medical  Services you may have received from other than [facility] providers in the past year (date may be approximate).     Assessment:   This is a routine wellness examination for Mindie.  Hearing/Vision screen Vision Screening - Comments:: Annual eye exams wear glasses   Dietary issues and exercise activities discussed: Current Exercise Habits: Home exercise routine, Type of exercise: strength training/weights, Time (Minutes): 30, Frequency (Times/Week): 3, Weekly Exercise (Minutes/Week): 90, Intensity: Mild, Exercise limited by: None identified   Goals Addressed   None    Depression Screen    08/18/2021    3:11 PM 08/18/2021    3:09 PM 07/23/2021    1:17 PM 01/18/2021   10:34 AM 09/29/2020    3:08 PM 08/17/2020    2:30 PM 07/16/2020   10:10 AM  PHQ 2/9 Scores  PHQ - 2 Score 0 0 0 1 4 0 0  PHQ- 9 Score   2 5 13  0 0    Fall Risk    08/18/2021    3:11 PM 08/17/2021    5:39 PM 07/23/2021    1:17 PM 01/18/2021   10:34 AM 09/29/2020    3:08 PM  Fall Risk   Falls in the past year? 0 0 0 0 1  Number falls in past yr: 0  0  0  Injury with Fall? 0  0  0  Risk for fall due to : No Fall Risks  No Fall Risks  No Fall Risks  Follow up Falls evaluation completed;Education provided  Falls evaluation completed Falls evaluation completed     FALL RISK PREVENTION PERTAINING TO THE HOME:  Any stairs in or around the home? Yes  If so, are there any without handrails? No  Home free of loose throw rugs in walkways, pet beds, electrical cords, etc? Yes  Adequate lighting in your home to reduce risk of falls? Yes   ASSISTIVE DEVICES UTILIZED TO PREVENT FALLS:  Life alert? No  Use of a cane, walker or w/c? Yes  Grab bars in the bathroom? Yes  Shower chair or bench in shower? Yes  Elevated toilet seat or a handicapped toilet? Yes     Cognitive  Function: Normal cognitive status assessed by telephone conversation by this Nurse Health Advisor. No abnormalities found.      07/13/2018    2:12 PM  03/23/2017    9:05 AM 09/05/2014    3:14 PM  MMSE - Mini Mental State Exam  Not completed: Unable to complete    Orientation to time  5 5  Orientation to Place  5 5  Registration  3 3  Attention/ Calculation  4 5  Recall  3 2  Language- name 2 objects  2 2  Language- repeat  1 1  Language- follow 3 step command  3 3  Language- read & follow direction  1 1  Write a sentence  1 1  Copy design  0 1  Total score  28 29        08/13/2019   12:04 PM  6CIT Screen  What Year? 0 points  What month? 0 points  What time? 0 points  Count back from 20 0 points  Months in reverse 0 points  Repeat phrase 2 points  Total Score 2 points    Immunizations Immunization History  Administered Date(s) Administered   Fluad Quad(high Dose 65+) 10/02/2018, 09/29/2020   Influenza Split 11/25/2010, 11/16/2011   Influenza,inj,Quad PF,6+ Mos 12/06/2012, 11/07/2013, 10/23/2014, 11/26/2015, 09/20/2016, 09/30/2017   Influenza-Unspecified 10/15/2019   PFIZER Comirnaty(Gray Top)Covid-19 Tri-Sucrose Vaccine 06/15/2020   PFIZER(Purple Top)SARS-COV-2 Vaccination 02/10/2019, 03/03/2019, 10/15/2019   Pfizer Covid-19 Vaccine Bivalent Booster 64yrs & up 10/27/2020   Pneumococcal Conjugate-13 02/19/2014   Pneumococcal Polysaccharide-23 03/10/2016   Tdap 07/15/2019   Zoster Recombinat (Shingrix) 04/28/2017, 09/30/2017    TDAP status: Up to date  Flu Vaccine status: Up to date  Pneumococcal vaccine status: Up to date  Covid-19 vaccine status: Completed vaccines  Qualifies for Shingles Vaccine? Yes   Zostavax completed Yes   Shingrix Completed?: Yes  Screening Tests Health Maintenance  Topic Date Due   INFLUENZA VACCINE  08/31/2021   TETANUS/TDAP  07/14/2029   Pneumonia Vaccine 66+ Years old  Completed   DEXA SCAN  Completed   Zoster Vaccines- Shingrix  Completed   HPV VACCINES  Aged Out   COVID-19 Vaccine  Discontinued    Health Maintenance  There are no preventive care reminders to display  for this patient.  Colorectal cancer screening: No longer required.   Mammogram status: No longer required due to age.  Bone Density status: Ordered declined . Pt provided with contact info and advised to call to schedule appt.  Lung Cancer Screening: (Low Dose CT Chest recommended if Age 66-80 years, 30 pack-year currently smoking OR have quit w/in 15years.) does not qualify.   Lung Cancer Screening Referral: n/a  Additional Screening:  Hepatitis C Screening: does not qualify;   Vision Screening: Recommended annual ophthalmology exams for early detection of glaucoma and other disorders of the eye. Is the patient up to date with their annual eye exam?  Yes  Who is the provider or what is the name of the office in which the patient attends annual eye exams? [facility]  If pt is not established with a provider, would they like to be referred to a provider to establish care? No .   Dental Screening: Recommended annual dental exams for proper oral hygiene  Community Resource Referral / Chronic Care Management: CRR required this visit?  No   CCM required this visit?  No      Plan:     I have personally reviewed and noted the  following in the patient's chart:   Medical and social history Use of alcohol, tobacco or illicit drugs  Current medications and supplements including opioid prescriptions.  Functional ability and status Nutritional status Physical activity Advanced directives List of other physicians Hospitalizations, surgeries, and ER visits in previous 12 months Vitals Screenings to include cognitive, depression, and falls Referrals and appointments  In addition, I have reviewed and discussed with patient certain preventive protocols, quality metrics, and best practice recommendations. A written personalized care plan for preventive services as well as general preventive health recommendations were provided to patient.     March RummageLaura Joshual Terrio, LPN   [id number]/[id number]/2023   Nurse  Notes: none

## 2021-09-21 ENCOUNTER — Other Ambulatory Visit: Payer: Self-pay | Admitting: Family Medicine

## 2021-11-02 ENCOUNTER — Other Ambulatory Visit: Payer: Self-pay | Admitting: Family Medicine

## 2021-11-16 ENCOUNTER — Other Ambulatory Visit (HOSPITAL_BASED_OUTPATIENT_CLINIC_OR_DEPARTMENT_OTHER): Payer: Self-pay

## 2021-11-16 MED ORDER — FLUAD QUADRIVALENT 0.5 ML IM PRSY
PREFILLED_SYRINGE | INTRAMUSCULAR | 0 refills | Status: DC
  Filled 2021-11-16: qty 0.5, 1d supply, fill #0

## 2021-11-16 MED ORDER — COMIRNATY 30 MCG/0.3ML IM SUSY
PREFILLED_SYRINGE | INTRAMUSCULAR | 0 refills | Status: DC
  Filled 2021-11-16: qty 0.3, 1d supply, fill #0

## 2021-12-06 NOTE — Progress Notes (Signed)
Chronic Care Management Pharmacy Note   Summary:  Patient is doing very well overall, no medication changes recently.  She is having an issue with her eyes and is seeing an ophthalmologist about this.  History of cataracts but she says this may be related to glaucoma.  Recommendations: No changes to meds   FU: 3 month BP check in 6 month PharmD.  12/15/2021 Name:  Tonya Johns MRN:  [id number] DOB:  [birth year]  Subjective: Tonya Johns is an 88 y.o. year old female who is a primary patient of Tabori, Aundra Millet, MD.  The CCM team was consulted for assistance with disease management and care coordination needs.    Engaged with patient by telephone for follow up visit in response to provider referral for pharmacy case management and/or care coordination services.   Consent to Services:  The patient was given information about Chronic Care Management services, agreed to services, and gave verbal consent prior to initiation of services.  Please see initial visit note for detailed documentation.   Patient Care Team: Midge Minium, MD as PCP - General (Family Medicine) Buford Dresser, MD as PCP - Cardiology (Cardiology) Renato Shin, MD (Inactive) as Consulting Physician (Endocrinology) Frederik Pear, MD as Consulting Physician (Orthopedic Surgery) Edythe Clarity, [facility] (Pharmacist)  Recent office visits:  01/18/21 Annye Asa, MD - Family Medicine - Hyperlipidemia - Labs were ordered. diclofenac Sodium (VOLTAREN) 1 % GEL and feeding supplement, GLUCERNA SHAKE, (GLUCERNA SHAKE) LIQD were prescribed. Follow up for Physical in 6 months.    Recent consult visits:  None noted.   Hospital visits: 11/23/20-11/30/20 Medication Reconciliation was completed by comparing discharge summary, patient's EMR and Pharmacy list, and upon discussion with patient.   Admitted to the hospital on 11/23/20 due to arthritis of right knee. Discharge date was 11/30/20. Discharged from  [place]?Medications Started at [facility] Discharge:?? aspirin EC oxyCODONE-acetaminophen (PERCOCET/ROXICET) tiZANidine (ZANAFLEX)   Medication Changes at Hospital Discharge: None noted.   Medications Discontinued at Hospital Discharge: acetaminophen 500 MG tablet (TYLENOL)   Medications that remain the same after Hospital Discharge:??  All other medications will remain the same.   Objective: Lab Results  Component Value Date   CREATININE 1.39 (H) 07/23/2021   CREATININE 1.37 (H) 01/18/2021   CREATININE 1.17 (H) 11/24/2020   GFR 33.72 (L) 07/23/2021   GFR 34.43 (L) 01/18/2021   GFRNONAA 45 (L) 11/24/2020   GFRNONAA 36 (L) 11/20/2020   HGBA1C 6.0 11/11/2020   HGBA1C 6.0 09/29/2020   HGBA1C 6.3 11/25/2010  Last diabetic Eye exam: No results found for: "HMDIABEYEEXA"  Last diabetic Foot exam: No results found for: "HMDIABFOOTEX"  Lab Results  Component Value Date   CHOL 187 07/23/2021   CHOL 214 (H) 01/18/2021   TRIG 160.0 (H) 07/23/2021   TRIG 132.0 01/18/2021   HDL 62.10 07/23/2021   HDL 58.20 01/18/2021   CHOLHDL 3 07/23/2021   CHOLHDL 4 01/18/2021   VLDL 32.0 07/23/2021   VLDL 26.4 01/18/2021   LDLCALC 93 07/23/2021   LDLCALC 130 (H) 01/18/2021   LDLDIRECT 124.0 09/14/2015   LDLDIRECT 195.4 02/14/2013      Latest Ref Rng & Units 07/23/2021    1:46 PM 01/18/2021   11:05 AM 09/29/2020    4:10 PM  Hepatic Function  Total Protein 6.0 - 8.3 g/dL 6.9  6.7  6.5   Albumin 3.5 - 5.2 g/dL 4.0  4.0  4.1   AST 0 - 37 U/L 18  15  15   ALT 0 - 35 U/L _0 Alk Phosphatase 39 - 117 U/L 82  84  77   Total Bilirubin 0.2 - 1.2 mg/dL 0.3  0.3  0.4   Bilirubin, Direct 0.0 - 0.3 mg/dL 0.1  0.1     Lab Results  Component Value Date/Time   TSH 1.39 07/23/2021 01:46 PM   TSH 1.70 01/18/2021 11:05 AM   FREET4 1.14 10/25/2018 01:42 PM   FREET4 1.43 03/24/2018 08:48 AM      Latest Ref Rng & Units 07/23/2021    1:46 PM 01/18/2021   11:05 AM  11/26/2020    3:38 AM  CBC  WBC 4.0 - 10.5 K/uL 6.8  7.5  12.1   Hemoglobin 12.0 - 15.0 g/dL 12.0  11.3  8.0   Hematocrit 36.0 - 46.0 % 35.7  33.4  23.5   Platelets 150.0 - 400.0 K/uL 202.0  220.0  160    Lab Results  Component Value Date/Time   VD25OH 44.57 07/23/2021 01:46 PM   VD25OH 39.83 03/09/2015 02:16 PM    Clinical ASCVD:  The ASCVD Risk score (Arnett DK, et al., 2019) failed to calculate for the following reasons:   The 2019 ASCVD risk score is only valid for ages 37 to 37    Social History   Tobacco Use  Smoking Status Never  Smokeless Tobacco Never   BP Readings from Last 3 Encounters:  07/23/21 120/62  01/18/21 132/82  11/30/20 (!) 159/72   Pulse Readings from Last 3 Encounters:  07/23/21 (!) 59  01/18/21 85  11/30/20 75   Wt Readings from Last 3 Encounters:  07/23/21 177 lb 6 oz (80.5 kg)  01/18/21 177 lb 12.8 oz (80.6 kg)  11/23/20 182 lb (82.6 kg)    Assessment: Review of patient past medical history, allergies, medications, health status, including review of consultants reports, laboratory and other test data, was performed as part of comprehensive evaluation and provision of chronic care management services.   SDOH:  (Social Determinants of Health) assessments and interventions performed:  SDOH Interventions    Flowsheet Row Clinical Support from 08/18/2021 in [place] Office Visit from 09/29/2020 in [place] from 08/17/2020 in [place] Office Visit from 08/13/2019 in [place]  SDOH Interventions      Food Insecurity Interventions Intervention Not Indicated -- Intervention Not Indicated --  Housing Interventions Intervention Not Indicated -- Intervention Not Indicated --  Transportation Interventions Intervention Not Indicated -- Intervention Not Indicated --   Depression Interventions/Treatment  -- Medication -- Medication, Currently on Treatment  Financial Strain Interventions Intervention Not Indicated -- Intervention Not Indicated --  Physical Activity Interventions Intervention Not Indicated -- Intervention Not Indicated --  Stress Interventions -- -- Intervention Not Indicated --  Social Connections Interventions Intervention Not Indicated -- Intervention Not Indicated --      CCM Care Plan Allergies  Allergen Reactions   Penicillins Rash    Did it involve swelling of the face/tongue/throat, SOB, or low BP? No Did it involve sudden or severe rash/hives, skin peeling, or any reaction on the inside of your mouth or nose? Yes Did you need to seek medical attention at a hospital or doctor's office? No When did it last happen?      approx 2005 If all above answers are "NO", may proceed with cephalosporin use.    Medications Reviewed Today  Reviewed by Edythe Clarity, RPH (Pharmacist) on 12/15/21 at 1139  Med List Status: <None>   Medication Order Taking? Sig Documenting Provider Last Dose Status Informant  aspirin EC 81 MG tablet [id number] Yes Take 1 tablet (81 mg total) by mouth 2 (two) times daily. Leighton Parody, PA-C Taking Active   Camphor-Menthol-Methyl Sal [facility]) 3.02-06-08 % [facility] [id number] Yes Apply 1 patch topically daily as needed (pain). [provider] Taking Active Family Member  Coenzyme Q10 (CO Q10) 100 MG CAPS [id number] Yes Take 100 mg by mouth at bedtime. [provider] Taking Active Family Member  COVID-19 mRNA vaccine [phone] (COMIRNATY) syringe [id number] Yes Inject into the muscle. Carlyle Basques, MD Taking Active   famotidine (PEPCID) 20 MG tablet [id number] Yes TAKE 1 TABLET BY MOUTH DAILY Midge Minium, MD Taking Active   feeding supplement, GLUCERNA SHAKE, ([place]) LIQD [id number] Yes Take 237 mLs by mouth 2 (two) times daily. Midge Minium, MD Taking Active    FLUoxetine (PROZAC) 10 MG capsule [id number] Yes TAKE 1 CAPSULE BY MOUTH DAILY Midge Minium, MD Taking Active   influenza vaccine adjuvanted (FLUAD QUADRIVALENT) 0.5 ML injection [id number] Yes Inject into the muscle. Carlyle Basques, MD Taking Active   levothyroxine (SYNTHROID) 100 MCG tablet [id number] Yes TAKE 1 TABLET BY MOUTH  DAILY Midge Minium, MD Taking Active   Menthol, Topical Analgesic, (BIOFREEZE ROLL-ON) 4 % GEL [id number] Yes Apply 1 application topically daily as needed (pain). [provider] Taking Active Family Member  Menthol, Topical Analgesic, (BLUE-EMU MAXIMUM STRENGTH) 2.5 % LIQD [id number] Yes Apply 1 application topically daily as needed (pain). [provider] Taking Active Family Member  Multiple Vitamin (MULTIVITAMIN WITH MINERALS) TABS tablet [id number] Yes Take 1 tablet by mouth at bedtime. [provider] Taking Active Family Member  pravastatin (PRAVACHOL) 80 MG tablet [id number] Yes TAKE 1 TABLET BY MOUTH DAILY Midge Minium, MD Taking Active            Patient Active Problem List   Diagnosis Date Noted   Arthritis of right knee 11/23/2020   Osteoarthritis of right knee 11/19/2020   Incomplete left bundle branch block (LBBB) 10/08/2020   Nonrheumatic aortic valve insufficiency 10/08/2020   Obesity (BMI 30-39.9) 01/07/2019   Abnormal EKG 03/23/2018   Dizziness 03/23/2018   Gait instability 03/23/2018   Bradycardia 03/23/2018   Primary osteoarthritis of left knee 01/09/2017   Degenerative arthritis of left knee 01/05/2017   Elevated blood pressure reading in office without diagnosis of hypertension 03/09/2015   Lumbar back pain 05/22/2014   Uterine prolapse 02/19/2014   Hypothyroidism following radioiodine therapy 07/26/2011   Elevated glucose 04/07/2011   Multinodular goiter 12/10/2010   Knee pain, bilateral 11/25/2010   Depression 11/25/2010   Heart murmur 11/25/2010   Physical exam 07/14/2010   GERD  (gastroesophageal reflux disease) 06/15/2010   Hyperlipidemia 06/15/2010   Immunization History  Administered Date(s) Administered   COVID-19, mRNA, vaccine(Comirnaty)12 years and older 11/16/2021   Fluad Quad(high Dose 65+) 10/02/2018, 09/29/2020, 11/16/2021   Influenza Split 11/25/2010, 11/16/2011   Influenza,inj,Quad PF,6+ Mos 12/06/2012, 11/07/2013, 10/23/2014, 11/26/2015, 09/20/2016, 09/30/2017   Influenza-Unspecified 10/15/2019   PFIZER Comirnaty(Gray Top)Covid-19 Tri-Sucrose Vaccine 06/15/2020   PFIZER(Purple Top)SARS-COV-2 Vaccination 02/10/2019, 03/03/2019, 10/15/2019   Pfizer Covid-19 Vaccine Bivalent Booster 42yr & up 10/27/2020   Pneumococcal Conjugate-13 02/19/2014   Pneumococcal Polysaccharide-23 03/10/2016   Tdap 07/15/2019   Zoster Recombinat (Shingrix) 04/28/2017, 09/30/2017    Conditions to be addressed/monitored: MDD HLD Hypothyroidism (following  RAI) GERD OA  Care Plan : CCM Pharmacy Care Plan  Updates made by Edythe Clarity, [facility] since 12/15/2021 12:00 AM     Problem: MDD HLD Hypothyroidism (following RAI) GERD OA   Priority: High     Long-Range Goal: Disease Management   Start Date: 05/28/2020  Expected End Date: 05/28/2021  Recent Progress: On track  Priority: High  Note:   Pharmacist Clinical Goal(s):  Over the next 365 days, patient will contact provider office for questions/concerns as evidenced notation of same in electronic health record through collaboration with PharmD and provider.   Interventions: 1:1 collaboration with Midge Minium, MD regarding development and update of comprehensive plan of care as evidenced by provider attestation and co-signature Inter-disciplinary care team collaboration (see longitudinal plan of care) Comprehensive medication review performed; medication list updated in electronic medical record  Hyperlipidemia: (LDL goal < 100) -Controlled, most recent LDL 93 -labs have been stable, low CV risk -Current  treatment: Pravastatin 80 mg once daily Appropriate, Effective, Safe, Accessible -Current dietary patterns: no changes  -Current exercise habits: trying to stay active outdoors, needing  -Educated on Benefits of statin for ASCVD risk reduction; Still tolerating medication, adherence is   Update 12/16/20 Has upcoming appt in December for lipid recheck LDL controlled at Feb visit.  Reports adherence with statin, no concerns for adverse effects at this time. Recheck lipids in Dec - continue current dose.  Depression (Goal: minimize symptoms) -Controlled per patient report. She is going to bible study two times per week and church on Sundays.    08/18/2021    3:11 PM 08/18/2021    3:09 PM 07/23/2021    1:17 PM  PHQ9 SCORE ONLY  PHQ-9 Total Score 0 0 2   -Feels good about control of depression  -Current treatment  Fluoxetine 10 mg once daily Appropriate, Effective, Safe, Accessible -No side effect concerns at this time   -Encouraged her to continue activities that she was interested in. No changes at this time.  GERD (minimize symptoms, avoid triggers) -Controlled -Still occasionally having symptoms ~1x/week - is happy with control. Certain meats will provoke reflux, tries not eat late at night. -Current treatment  Pantoprazole 60m daily -Reviewed triggers today - no additional issues noted -Recommend continue medication   Update 12/16/20 Now taking Protonix for GERD symptoms, she states this has improved her symptoms.  Confirmed she was taking daily at appropriate time. Work on [employer]  She is pleased with improvement since starting this med. No changes - continue as current.  Patient Goals/Self-Care Activities - take medications as prescribed target a minimum of 150 minutes of moderate intensity exercise weekly  Medication Assistance: None required.  Patient affirms current coverage meets needs.  Patient's preferred pharmacy [facility]:  [id number] [place], [place]: [phone]: [phone] [address]ss[address]eCAFond [address]aco[address]ekin[address]hone: [phone]: [phone][id number] GaryC Doyle[place]oy[place]one: [phone]: [phone] pill box? Yes. Pt endorses 100% compliance  Follow Up:  Patient agrees to Care Plan and Follow-up. Plan: [facility] months.             Future Appointments  Date Time Provider DepDiamondhead Lake2/29/2023  1:20 PM TabMidge MiniumD [facility]/25/2024  8:15 AM [facility] HEALTH COACH [facility] Gary, PharmD Clinical Pharmacist Practitioner [facility] [phone]

## 2021-12-09 DIAGNOSIS — H469 Unspecified optic neuritis: Secondary | ICD-10-CM | POA: Diagnosis not present

## 2021-12-15 ENCOUNTER — Ambulatory Visit: Payer: Medicare Other | Admitting: Pharmacist

## 2021-12-15 ENCOUNTER — Other Ambulatory Visit: Payer: Self-pay | Admitting: Ophthalmology

## 2021-12-15 DIAGNOSIS — H469 Unspecified optic neuritis: Secondary | ICD-10-CM

## 2021-12-15 DIAGNOSIS — E785 Hyperlipidemia, unspecified: Secondary | ICD-10-CM

## 2021-12-15 DIAGNOSIS — F32A Depression, unspecified: Secondary | ICD-10-CM

## 2021-12-15 NOTE — Patient Instructions (Addendum)
Visit Information   Goals Addressed             This Visit's Progress    Track and Manage My Blood Pressure-Hypertension   On track    Timeframe:  Long-Range Goal Priority:  High Start Date: 12/16/20                            Expected End Date:  06/15/21                     Follow Up Date 03/18/21    - check blood pressure weekly - choose a place to take my blood pressure (home, clinic or office, retail store) - write blood pressure results in a log or diary    Why is this important?   You won't feel high blood pressure, but it can still hurt your blood vessels.  High blood pressure can cause heart or kidney problems. It can also cause a stroke.  Making lifestyle changes like losing a little weight or eating less salt will help.  Checking your blood pressure at home and at different times of the day can help to control blood pressure.  If the doctor prescribes medicine remember to take it the way the doctor ordered.  Call the office if you cannot afford the medicine or if there are questions about it.     Notes:        Patient Care Plan: CCM Pharmacy Care Plan     Problem Identified: MDD HLD Hypothyroidism (following RAI) GERD OA   Priority: High     Long-Range Goal: Disease Management   Start Date: 05/28/2020  Expected End Date: 05/28/2021  Recent Progress: On track  Priority: High  Note:   Pharmacist Clinical Goal(s):  Over the next 365 days, patient will contact provider office for questions/concerns as evidenced notation of same in electronic health record through collaboration with PharmD and provider.   Interventions: 1:1 collaboration with Sheliah Hatch, MD regarding development and update of comprehensive plan of care as evidenced by provider attestation and co-signature Inter-disciplinary care team collaboration (see longitudinal plan of care) Comprehensive medication review performed; medication list updated in electronic medical  record  Hyperlipidemia: (LDL goal < 100) -Controlled, most recent LDL 93 -labs have been stable, low CV risk -Current treatment: Pravastatin 80 mg once daily Appropriate, Effective, Safe, Accessible -Current dietary patterns: no changes  -Current exercise habits: trying to stay active outdoors, needing  -Educated on Benefits of statin for ASCVD risk reduction; Still tolerating medication, adherence is   Update 12/16/20 Has upcoming appt in December for lipid recheck LDL controlled at Feb visit.  Reports adherence with statin, no concerns for adverse effects at this time. Recheck lipids in Dec - continue current dose.  Depression (Goal: minimize symptoms) -Controlled per patient report. She is going to bible study two times per week and church on Sundays.    08/18/2021    3:11 PM 08/18/2021    3:09 PM 07/23/2021    1:17 PM  PHQ9 SCORE ONLY  PHQ-9 Total Score 0 0 2   -Feels good about control of depression  -Current treatment  Fluoxetine 10 mg once daily Appropriate, Effective, Safe, Accessible -No side effect concerns at this time   -Encouraged her to continue activities that she was interested in. No changes at this time.  GERD (minimize symptoms, avoid triggers) -Controlled -Still occasionally having symptoms ~1x/week - is happy with control.  Certain meats will provoke reflux, tries not eat late at night. -Current treatment  Pantoprazole 40mg  daily -Reviewed triggers today - no additional issues noted -Recommend continue medication   Update 12/16/20 Now taking Protonix for GERD symptoms, she states this has improved her symptoms.  Confirmed she was taking daily at appropriate time. Work on 12/18/20.  She is pleased with improvement since starting this med. No changes - continue as current.  Patient Goals/Self-Care Activities - take medications as prescribed target a minimum of 150 minutes of moderate intensity exercise weekly  Medication Assistance: None  required.  Patient affirms current coverage meets needs.  Patient's preferred pharmacy is:  [id number] [employer], [place] - [address] [place] Phone: [phone] Fax: [phone]  [facility] - [place], [place] - [place] [place], [address] [place], [address] [place] Phone: [phone] Fax: [phone]  [id number] [phone], [place] - [place] [address] [place] Phone: [phone] Fax: [phone]  Uses pill box? Yes. Pt endorses 100% compliance  Follow Up:  Patient agrees to Care Plan and Follow-up. Plan: [facility] f/u telephone call 6 months.            The patient verbalized understanding of instructions, educational materials, and care plan provided today and DECLINED offer to receive copy of patient instructions, educational materials, and care plan.  Telephone follow up appointment with pharmacy team member scheduled for: 12 months  [facility], [facility]  [facility], PharmD Clinical Pharmacist  [facility] [phone]

## 2022-01-03 ENCOUNTER — Encounter: Payer: Self-pay | Admitting: Family Medicine

## 2022-01-09 ENCOUNTER — Ambulatory Visit
Admission: RE | Admit: 2022-01-09 | Discharge: 2022-01-09 | Disposition: A | Discharge status: Home / Self Care | Payer: Medicare Other | Source: Ambulatory Visit | Attending: Ophthalmology | Admitting: Ophthalmology

## 2022-01-09 DIAGNOSIS — H469 Unspecified optic neuritis: Secondary | ICD-10-CM

## 2022-01-09 DIAGNOSIS — I6782 Cerebral ischemia: Secondary | ICD-10-CM | POA: Diagnosis not present

## 2022-01-09 MED ORDER — GADOPICLENOL 0.5 MMOL/ML IV SOLN
7.5000 mL | Freq: Once | INTRAVENOUS | Status: AC | PRN
  Administered 2022-01-09: 7.5 mL via INTRAVENOUS

## 2022-01-28 ENCOUNTER — Ambulatory Visit: Payer: Medicare Other | Admitting: Family Medicine

## 2022-02-07 ENCOUNTER — Telehealth: Payer: Self-pay | Admitting: Family Medicine

## 2022-02-07 ENCOUNTER — Encounter: Payer: Self-pay | Admitting: Family Medicine

## 2022-02-07 DIAGNOSIS — R030 Elevated blood-pressure reading, without diagnosis of hypertension: Secondary | ICD-10-CM | POA: Diagnosis not present

## 2022-02-07 DIAGNOSIS — U071 COVID-19: Secondary | ICD-10-CM | POA: Diagnosis not present

## 2022-02-07 NOTE — Telephone Encounter (Signed)
Patient was seen today and tested positive for Covid. She was not given any medication since she wasn't showing any symptoms. While she was there her BP was 200/77, she has an appt with Dr Birdie Riddle on Wednesday. She wants to know does she need to keep it or reschedule since she has covid. I let her know that I would ask what Dr Birdie Riddle felt comfortable doing since her BP was so high. I confirmed that she could check it at home and I advised her to do so for the next 24 hours. I let her know that we would call her back once Dr Birdie Riddle let me know what she wished to do, I also let her know that virtual was an option.

## 2022-02-07 NOTE — Telephone Encounter (Signed)
I would prefer to see pt in office to verify her BP and treat if needed.  As long as she remains masked for her visit, we are happy to see her.  If she has a way to check her BP at home, I would like her to do so- especially if she develops a headache.  If she develops chest pain or shortness of breath or blurry vision or dizziness she needs to go to the ER for evaluation

## 2022-02-08 NOTE — Telephone Encounter (Signed)
Advised pt of Dr Birdie Riddle message

## 2022-02-09 ENCOUNTER — Encounter: Payer: Self-pay | Admitting: Family Medicine

## 2022-02-09 ENCOUNTER — Ambulatory Visit (INDEPENDENT_AMBULATORY_CARE_PROVIDER_SITE_OTHER): Payer: Medicare Other | Admitting: Family Medicine

## 2022-02-09 VITALS — BP 142/82 | HR 71 | Temp 99.0°F | Resp 17 | Ht 63.0 in | Wt 179.2 lb

## 2022-02-09 DIAGNOSIS — F32A Depression, unspecified: Secondary | ICD-10-CM | POA: Diagnosis not present

## 2022-02-09 DIAGNOSIS — U071 COVID-19: Secondary | ICD-10-CM | POA: Insufficient documentation

## 2022-02-09 DIAGNOSIS — R030 Elevated blood-pressure reading, without diagnosis of hypertension: Secondary | ICD-10-CM

## 2022-02-09 NOTE — Progress Notes (Signed)
   Subjective:    Patient ID: Tonya Johns, female    DOB: 1930/09/11, 89 y.o.   MRN: [id number]  HPI Elevated BP- while at UC, BP was quite high at 196/76.  This was taken using an automated device.  Denies HA, CP, SOB.  No edema.  Pt reports home BP's are typically in the 130s.  Was taking cough and cold medication at the time  COVID- 'i feel pretty good.  I don't feel sick'.  Tested + at [facility].  Depression- pt reports this week has been very stressful.  Has had issues w/ heat.  Son has been sick- hospitalized w/ COVID.  Currently on Fluoxetine 10mg  daily.  Feels sxs are situational right now.   Review of Systems For ROS see HPI     Objective:   Physical Exam Vitals reviewed.  Constitutional:      General: She is not in acute distress.    Appearance: Normal appearance. She is well-developed. She is not ill-appearing.  HENT:     Head: Normocephalic and atraumatic.  Eyes:     Conjunctiva/sclera: Conjunctivae normal.     Pupils: Pupils are equal, round, and reactive to light.  Neck:     Thyroid: No thyromegaly.  Cardiovascular:     Rate and Rhythm: Normal rate and regular rhythm.     Pulses: Normal pulses.     Heart sounds: Normal heart sounds. No murmur heard. Pulmonary:     Effort: Pulmonary effort is normal. No respiratory distress.     Breath sounds: Normal breath sounds.  Abdominal:     General: There is no distension.     Palpations: Abdomen is soft.     Tenderness: There is no abdominal tenderness.  Musculoskeletal:     Cervical back: Normal range of motion and neck supple.     Right lower leg: No edema.     Left lower leg: No edema.  Lymphadenopathy:     Cervical: No cervical adenopathy.  Skin:    General: Skin is warm and dry.  Neurological:     General: No focal deficit present.     Mental Status: She is alert and oriented to person, place, and time.  Psychiatric:        Mood and Affect: Mood normal.        Behavior: Behavior normal.           Assessment & Plan:

## 2022-02-09 NOTE — Patient Instructions (Signed)
Schedule your complete physical for late June or early July Blood pressure looks good today!  No need for medication Continue to drink plenty of fluids and REST to get over COVID IF the mood does not improve, let me know and we can adjust the medication Call with any questions or concerns Hang in there!!!

## 2022-02-10 DIAGNOSIS — H469 Unspecified optic neuritis: Secondary | ICD-10-CM | POA: Diagnosis not present

## 2022-02-12 NOTE — Assessment & Plan Note (Signed)
Pt has hx of this in the past but BP has always returned to normal.  Her elevated reading was likely multifactorial- stress, illness, automated cuff, cold medication.  BP in office today is much closer to normal and given her age, I don't want to medicate and risk dropping her too low.  Will continue to monitor at future visits.

## 2022-02-12 NOTE — Assessment & Plan Note (Signed)
New.  Thankfully pt reports being asymptomatic at this time.  She is aware that she is to quarantine to avoid exposing others and is to let me know if she develops symptoms.

## 2022-02-12 NOTE — Assessment & Plan Note (Signed)
Pt is currently having increased symptoms but feels that these are situational due to son's recent hospitalization, difficulty w/ her heat.  She is currently on Fluoxetine 10mg  daily and is not interested in increasing at this time.  Will continue to follow.

## 2022-06-01 ENCOUNTER — Other Ambulatory Visit: Payer: Self-pay | Admitting: Family Medicine

## 2022-06-02 ENCOUNTER — Telehealth: Payer: Self-pay | Admitting: Pharmacist

## 2022-06-02 ENCOUNTER — Other Ambulatory Visit: Payer: Self-pay

## 2022-06-02 NOTE — Progress Notes (Unsigned)
Care Management & Coordination Services Pharmacy Team   Reason for Encounter: Hypertension   Contacted patient to discuss hypertension disease state. {US HC Outreach:28874}    Current antihypertensive regimen:  No prescription medication at this time  Patient verbally confirms she is taking the above medications as directed. {yes/no:20286}  How often are you checking your Blood Pressure? {CH[facility] HP BP Monitoring Frequency:[phone]}  she checks her blood pressure in the morning . Not current on a prescription medication for blood pressure.   Current home BP readings: ***  DATE:             BP               PULSE   Wrist or arm cuff: Caffeine intake: Salt intake: OTC medications including pseudoephedrine or NSAIDs?  Any readings above 180/100? {yes/no:20286} If yes any symptoms of hypertensive emergency? {hypertensive emergency symptoms:25354}  What recent interventions/DTPs have been made by any provider to improve Blood Pressure control since last CPP Visit:    Any recent hospitalizations or ED visits since last visit with CPP? No  What diet changes have been made to improve Blood Pressure Control?  Patient tried to watch   What exercise is being done to improve your Blood Pressure Control?  Patient reports she tries to remain as active as she can.   Adherence Review: Is the patient currently on ACE/ARB medication? No Does the patient have >5 day gap between last estimated fill dates? No  Star Rating Drugs:  Medication:   Last Fill: Day Supply  Pravastatin 80 MG tablet  04/28/22 100  Chart Updates: Recent office visits:  02/09/22 Neena Rhymes, MD - Family Medicine - Depression - Continue current medications. Follow up as scheduled.   02/07/22 Azalee Course - Urgent Care - COVID 19 -   Recent consult visits:  [facility] visits:  None in previous 6 months  Medications: Outpatient Encounter Medications as of 06/02/2022  Medication Sig   aspirin EC 81  MG tablet Take 1 tablet (81 mg total) by mouth 2 (two) times daily.   Camphor-Menthol-Methyl Sal (SALONPAS) 3.02-06-08 % PTCH Apply 1 patch topically daily as needed (pain).   Coenzyme Q10 (CO Q10) 100 MG CAPS Take 100 mg by mouth at bedtime.   famotidine (PEPCID) 20 MG tablet TAKE 1 TABLET BY MOUTH DAILY   feeding supplement, GLUCERNA SHAKE, (GLUCERNA SHAKE) LIQD Take 237 mLs by mouth 2 (two) times daily.   FLUoxetine (PROZAC) 10 MG capsule TAKE 1 CAPSULE BY MOUTH DAILY   Menthol, Topical Analgesic, (BIOFREEZE ROLL-ON) 4 % GEL Apply 1 application topically daily as needed (pain).   Menthol, Topical Analgesic, (BLUE-EMU MAXIMUM STRENGTH) 2.5 % LIQD Apply 1 application topically daily as needed (pain).   Multiple Vitamin (MULTIVITAMIN WITH MINERALS) TABS tablet Take 1 tablet by mouth at bedtime.   pravastatin (PRAVACHOL) 80 MG tablet TAKE 1 TABLET BY MOUTH DAILY   No facility-administered encounter medications on file as of 06/02/2022.    Recent Office Vitals: BP Readings from Last 3 Encounters:  02/09/22 (!) 142/82  07/23/21 120/62  01/18/21 132/82   Pulse Readings from Last 3 Encounters:  02/09/22 71  07/23/21 (!) 59  01/18/21 85    Wt Readings from Last 3 Encounters:  02/09/22 179 lb 4 oz (81.3 kg)  07/23/21 177 lb 6 oz (80.5 kg)  01/18/21 177 lb 12.8 oz (80.6 kg)     Kidney Function Lab Results  Component Value Date/Time   CREATININE 1.39 (H)  07/23/2021 01:46 PM   CREATININE 1.37 (H) 01/18/2021 11:05 AM   CREATININE 1.30 (H) 01/07/2019 11:31 AM   CREATININE 1.20 (H) 09/05/2014 02:50 PM   GFR 33.72 (L) 07/23/2021 01:46 PM   GFRNONAA 45 (L) 11/24/2020 03:31 AM   GFRAA 55 (L) 03/23/2018 03:05 PM       Latest Ref Rng & Units 07/23/2021    1:46 PM 01/18/2021   11:05 AM 11/24/2020    3:31 AM  BMP  Glucose 70 - 99 mg/dL 829  92  562   BUN 6 - 23 mg/dL 20  16  15    Creatinine 0.40 - 1.20 mg/dL 1.30  8.65  7.84   Sodium 135 - 145 mEq/L 139  140  135   Potassium 3.5 - 5.1  mEq/L 4.1  4.0  4.7   Chloride 96 - 112 mEq/L 102  105  106   CO2 19 - 32 mEq/L 30  23  24    Calcium 8.4 - 10.5 mg/dL 9.7  9.7  8.8      Future Appointments  Date Time Provider Department Center  07/26/2022 10:00 AM Sheliah Hatch, MD [facility]  08/25/2022  8:15 AM [facility] ANNUAL WELLNESS VISIT [facility]  12/21/2022 12:00 PM Earlene Plater, Christian L, RPH [facility] None    [employer] [employer]

## 2022-07-02 ENCOUNTER — Other Ambulatory Visit: Payer: Self-pay | Admitting: Family Medicine

## 2022-07-26 ENCOUNTER — Ambulatory Visit (INDEPENDENT_AMBULATORY_CARE_PROVIDER_SITE_OTHER): Payer: Medicare Other | Admitting: Family Medicine

## 2022-07-26 ENCOUNTER — Encounter: Payer: Self-pay | Admitting: Family Medicine

## 2022-07-26 VITALS — BP 136/78 | HR 65 | Temp 98.0°F | Resp 17 | Ht 63.0 in | Wt 184.5 lb

## 2022-07-26 DIAGNOSIS — M25512 Pain in left shoulder: Secondary | ICD-10-CM | POA: Diagnosis not present

## 2022-07-26 DIAGNOSIS — M25511 Pain in right shoulder: Secondary | ICD-10-CM | POA: Diagnosis not present

## 2022-07-26 DIAGNOSIS — Z0000 Encounter for general adult medical examination without abnormal findings: Secondary | ICD-10-CM | POA: Diagnosis not present

## 2022-07-26 DIAGNOSIS — G8929 Other chronic pain: Secondary | ICD-10-CM

## 2022-07-26 DIAGNOSIS — E785 Hyperlipidemia, unspecified: Secondary | ICD-10-CM | POA: Diagnosis not present

## 2022-07-26 LAB — BASIC METABOLIC PANEL
BUN: 22 mg/dL (ref 6–23)
CO2: 27 mEq/L (ref 19–32)
Calcium: 10.4 mg/dL (ref 8.4–10.5)
Chloride: 102 mEq/L (ref 96–112)
Creatinine, Ser: 1.5 mg/dL — ABNORMAL HIGH (ref 0.40–1.20)
GFR: 30.56 mL/min — ABNORMAL LOW (ref >60.00)
Glucose, Bld: 72 mg/dL (ref 70–99)
Potassium: 4.4 mEq/L (ref 3.5–5.1)
Sodium: 137 mEq/L (ref 135–145)

## 2022-07-26 LAB — CBC WITH DIFFERENTIAL/PLATELET
Basophils Absolute: 0.1 10*3/uL (ref 0.0–0.1)
Basophils Relative: 1 % (ref 0.0–3.0)
Eosinophils Absolute: 0.1 10*3/uL (ref 0.0–0.7)
Eosinophils Relative: 1.9 % (ref 0.0–5.0)
HCT: 35.3 % — ABNORMAL LOW (ref 36.0–46.0)
Hemoglobin: 11.6 g/dL — ABNORMAL LOW (ref 12.0–15.0)
Lymphocytes Relative: 25.3 % (ref 12.0–46.0)
Lymphs Abs: 1.7 10*3/uL (ref 0.7–4.0)
MCHC: 32.9 g/dL (ref 30.0–36.0)
MCV: 92 fl (ref 78.0–100.0)
Monocytes Absolute: 0.4 10*3/uL (ref 0.1–1.0)
Monocytes Relative: 5.5 % (ref 3.0–12.0)
Neutro Abs: 4.5 10*3/uL (ref 1.4–7.7)
Neutrophils Relative %: 66.3 % (ref 43.0–77.0)
Platelets: 233 10*3/uL (ref 150.0–400.0)
RBC: 3.83 Mil/uL — ABNORMAL LOW (ref 3.87–5.11)
RDW: 13.7 % (ref 11.5–15.5)
WBC: 6.7 10*3/uL (ref 4.0–10.5)

## 2022-07-26 LAB — HEPATIC FUNCTION PANEL
ALT: 11 U/L (ref 0–35)
AST: 20 U/L (ref 0–37)
Albumin: 4.1 g/dL (ref 3.5–5.2)
Alkaline Phosphatase: 71 U/L (ref 39–117)
Bilirubin, Direct: 0.1 mg/dL (ref 0.0–0.3)
Total Bilirubin: 0.4 mg/dL (ref 0.2–1.2)
Total Protein: 7.2 g/dL (ref 6.0–8.3)

## 2022-07-26 LAB — LIPID PANEL
Cholesterol: 272 mg/dL — ABNORMAL HIGH (ref 0–200)
HDL: 59.3 mg/dL (ref >39.00)
LDL Cholesterol: 175 mg/dL — ABNORMAL HIGH (ref 0–99)
NonHDL: 212.3
Total CHOL/HDL Ratio: 5
Triglycerides: 185 mg/dL — ABNORMAL HIGH (ref 0.0–149.0)
VLDL: 37 mg/dL (ref 0.0–40.0)

## 2022-07-26 LAB — TSH: TSH: 3.7 u[IU]/mL (ref 0.35–5.50)

## 2022-07-26 NOTE — Patient Instructions (Signed)
Follow up in 1 year or as needed We'll notify you of your lab results and make any changes if needed Continue to work on healthy diet and regular exercise Call with any questions or concerns Have a great summer!!!

## 2022-07-26 NOTE — Assessment & Plan Note (Signed)
Pt's PE unchanged from previous and WNL w/ exception of known goiter.  No longer doing routine health screenings.  UTD on immunizations.  Check labs.  Anticipatory guidance provided.

## 2022-07-26 NOTE — Assessment & Plan Note (Signed)
Ongoing issue.  Currently asymptomatic.  Check labs.  Adjust meds prn  

## 2022-07-26 NOTE — Progress Notes (Signed)
   Subjective:    Patient ID: Tonya Johns, female    DOB: 01-Jul-1930, 89 y.o.   MRN: [id number]  HPI CPE- UTD on PNA.  Pt is no longer doing mammograms or DEXA.Marland Kitchen  Pt reports feeling well w/ exception of shoulder pain.  Patient Care Team    Relationship Specialty Notifications Start End  Sheliah Hatch, MD PCP - General Family Medicine  08/11/10    Comment: Lajean Manes, MD PCP - Cardiology Cardiology  10/08/20   Romero Belling, MD (Inactive) Consulting Physician Endocrinology  09/05/14   Gean Birchwood, MD Consulting Physician Orthopedic Surgery  12/09/16   Erroll Luna, [facility] (Inactive)  Pharmacist  12/16/20    Comment: [phone]     Health Maintenance  Topic Date Due   Medicare Annual Wellness (AWV)  08/19/2022   INFLUENZA VACCINE  09/01/2022   DTaP/Tdap/Td (2 - Td or Tdap) 07/14/2029   Pneumonia Vaccine 17+ Years old  Completed   DEXA SCAN  Completed   Zoster Vaccines- Shingrix  Completed   HPV VACCINES  Aged Out   COVID-19 Vaccine  Discontinued      Review of Systems Patient reports no vision/ hearing changes, adenopathy,fever, weight change,  persistant/recurrent hoarseness , swallowing issues, chest pain, palpitations, edema, persistant/recurrent cough, hemoptysis, dyspnea (rest/exertional/paroxysmal nocturnal), gastrointestinal bleeding (melena, rectal bleeding), abdominal pain, significant heartburn, bowel changes, GU symptoms (dysuria, hematuria, incontinence), Gyn symptoms (abnormal  bleeding, pain),  syncope, focal weakness, memory loss, numbness & tingling, skin/hair/nail changes, abnormal bruising or bleeding, anxiety, or depression.     Objective:   Physical Exam General Appearance:    Alert, cooperative, no distress, appears stated age  Head:    Normocephalic, without obvious abnormality, atraumatic  Eyes:    PERRL, conjunctiva/corneas clear, EOM's intact, fundi    benign, both eyes  Ears:    Normal TM's and external ear canals, both ears   Nose:   Nares normal, septum midline, mucosa normal, no drainage    or sinus tenderness  Throat:   Lips, mucosa, and tongue normal; teeth and gums normal  Neck:   Supple, symmetrical, trachea midline, no adenopathy;    Thyroid: goiter  Back:     Symmetric, no curvature, ROM normal, no CVA tenderness  Lungs:     Clear to auscultation bilaterally, respirations unlabored  Chest Wall:    No tenderness or deformity   Heart:    Regular rate and rhythm, S1 and S2 normal, no murmur, rub   or gallop  Breast Exam:    Deferred  Abdomen:     Soft, non-tender, bowel sounds active all four quadrants,    no masses, no organomegaly  Genitalia:    Deferred  Rectal:    Extremities:   Extremities normal, atraumatic, no cyanosis or edema  Pulses:   2+ and symmetric all extremities  Skin:   Skin color, texture, turgor normal, no rashes or lesions  Lymph nodes:   Cervical, supraclavicular, and axillary nodes normal  Neurologic:   CNII-XII intact, normal strength, sensation and reflexes    throughout          Assessment & Plan:

## 2022-07-27 ENCOUNTER — Other Ambulatory Visit: Payer: Self-pay

## 2022-07-27 ENCOUNTER — Telehealth: Payer: Self-pay

## 2022-07-27 DIAGNOSIS — E785 Hyperlipidemia, unspecified: Secondary | ICD-10-CM

## 2022-07-27 DIAGNOSIS — R944 Abnormal results of kidney function studies: Secondary | ICD-10-CM

## 2022-07-27 NOTE — Telephone Encounter (Signed)
Pt aware of lab results . BMP and Lipid panel orders are in . PT is going to call back and make her 2 wk lab only visit when she gets her daughters schedule

## 2022-07-27 NOTE — Telephone Encounter (Signed)
-----   Message from Sheliah Hatch, MD sent at 07/27/2022  7:32 AM EDT ----- Creatinine (marker of kidney function) has increased, which means your GFR (filtration rate of kidney) has decreased.  Please increase your water intake and we'll repeat your BMP at a lab only visit in 1-2 weeks (dx decreased GFR)  Your total cholesterol and LDL (bad cholesterol) have both jumped by nearly 100 points.  Please make sure you are taking your Pravastatin daily and we will repeat your cholesterol at the same lab only visit in 1-2 weeks (dx hyperlipidemia).  Please make sure you are fasting so we get an accurate reading and know whether to adjust medications.  Remainder of labs look good

## 2022-08-09 ENCOUNTER — Ambulatory Visit: Payer: Medicare Other | Admitting: Orthopaedic Surgery

## 2022-08-10 ENCOUNTER — Encounter: Payer: Self-pay | Admitting: Orthopaedic Surgery

## 2022-08-10 ENCOUNTER — Other Ambulatory Visit (INDEPENDENT_AMBULATORY_CARE_PROVIDER_SITE_OTHER): Payer: Medicare Other

## 2022-08-10 ENCOUNTER — Ambulatory Visit (INDEPENDENT_AMBULATORY_CARE_PROVIDER_SITE_OTHER): Payer: Medicare Other | Admitting: Orthopaedic Surgery

## 2022-08-10 DIAGNOSIS — M25511 Pain in right shoulder: Secondary | ICD-10-CM

## 2022-08-10 DIAGNOSIS — M25512 Pain in left shoulder: Secondary | ICD-10-CM

## 2022-08-10 DIAGNOSIS — G8929 Other chronic pain: Secondary | ICD-10-CM

## 2022-08-10 MED ORDER — BUPIVACAINE HCL 0.5 % IJ SOLN
2.0000 mL | INTRAMUSCULAR | Status: AC | PRN
  Administered 2022-08-10: 2 mL

## 2022-08-10 MED ORDER — METHYLPREDNISOLONE ACETATE 40 MG/ML IJ SUSP
40.0000 mg | INTRAMUSCULAR | Status: AC | PRN
Start: 2022-08-10 — End: 2022-08-10
  Administered 2022-08-10: 40 mg via INTRAMUSCULAR

## 2022-08-10 MED ORDER — LIDOCAINE HCL 1 % IJ SOLN
2.0000 mL | INTRAMUSCULAR | Status: AC | PRN
  Administered 2022-08-10: 2 mL

## 2022-08-10 NOTE — Progress Notes (Signed)
Office Visit Note   Patient: Tonya Johns           Date of Birth: March 31, 1930           MRN: [id number] Visit Date: 08/10/2022              Requested by: Sheliah Hatch, MD [address] [place],  [place] [address] PCP: Sheliah Hatch, MD   Assessment & Plan: Visit Diagnoses:  1. Chronic pain of both shoulders     Plan: Patient is 89 year old female with bilateral shoulder pain greater on the left.  Pain seems to be localized to the left trapezius region but I suspect she may have a component of rotator cuff syndrome.  We will try a left trapezial trigger point injection.  If symptoms persist she should follow-up.  I will make a referral to outpatient PT.  Follow-Up Instructions: No follow-ups on file.   Orders:  Orders Placed This Encounter  Procedures  . Trigger Point Inj  . XR Shoulder Left  . XR Shoulder Right  . Ambulatory referral to Physical Therapy   No orders of the defined types were placed in this encounter.     Procedures: Trigger Point Inj  Date/Time: 08/10/2022 11:17 AM  Performed by: Tarry Kos, MD Authorized by: Tarry Kos, MD   Consent Given by:  Patient Indications:  Muscle spasm and pain Total # of Trigger Points:  1 Location: shoulder   Needle Size:  25 G Approach:  Dorsal Medications #1:  40 mg methylPREDNISolone acetate 40 MG/ML; 2 mL bupivacaine 0.5 %; 2 mL lidocaine 1 % Patient tolerance:  Patient tolerated the procedure well with no immediate complications    Clinical Data: No additional findings.   Subjective: Chief Complaint  Patient presents with  . Right Shoulder - Pain  . Left Shoulder - Pain    HPI Patient is 89 year old female comes in for bilateral shoulder pain mainly on the left.  Reports pain to the trapezius region.  She has had pain for years.  States it hurts most the time and that is worse with cold air. Review of Systems  Constitutional: Negative.   HENT: Negative.    Eyes: Negative.    Respiratory: Negative.    Cardiovascular: Negative.   Endocrine: Negative.   Musculoskeletal: Negative.   Neurological: Negative.   Hematological: Negative.   Psychiatric/Behavioral: Negative.    All other systems reviewed and are negative.    Objective: Vital Signs: There were no vitals taken for this visit.  Physical Exam Vitals and nursing note reviewed.  Constitutional:      Appearance: She is well-developed.  HENT:     Head: Atraumatic.     Nose: Nose normal.  Eyes:     Extraocular Movements: Extraocular movements intact.  Cardiovascular:     Pulses: Normal pulses.  Pulmonary:     Effort: Pulmonary effort is normal.  Abdominal:     Palpations: Abdomen is soft.  Musculoskeletal:     Cervical back: Neck supple.  Skin:    General: Skin is warm.     Capillary Refill: Capillary refill takes less than 2 seconds.  Neurological:     Mental Status: She is alert. Mental status is at baseline.  Psychiatric:        Behavior: Behavior normal.        Thought Content: Thought content normal.        Judgment: Judgment normal.    Ortho Exam Examination  of both show normal passive range of motion without significant pain.  Manual muscle testing of the supraspinatus shows mild weakness and pain.  Moderate pain to the lateral deltoid with impingement testing.  Slight tenderness to the left trapezius muscle. Specialty Comments:  No specialty comments available.  Imaging: XR Shoulder Left  Result Date: 08/10/2022 Mild degenerative changes appropriate for age.  No acute abnormalities.  XR Shoulder Right  Result Date: 08/10/2022 Mild degenerative changes appropriate for age.  No acute abnormalities.    PMFS History: Patient Active Problem List   Diagnosis Date Noted  . COVID 02/09/2022  . Arthritis of right knee 11/23/2020  . Osteoarthritis of right knee 11/19/2020  . Incomplete left bundle branch block (LBBB) 10/08/2020  . Nonrheumatic aortic valve insufficiency  10/08/2020  . Obesity (BMI 30-39.9) 01/07/2019  . Abnormal EKG 03/23/2018  . Dizziness 03/23/2018  . Gait instability 03/23/2018  . Bradycardia 03/23/2018  . Primary osteoarthritis of left knee 01/09/2017  . Degenerative arthritis of left knee 01/05/2017  . Elevated blood pressure reading in office without diagnosis of hypertension 03/09/2015  . Lumbar back pain 05/22/2014  . Uterine prolapse 02/19/2014  . Hypothyroidism following radioiodine therapy 07/26/2011  . Elevated glucose 04/07/2011  . Multinodular goiter 12/10/2010  . Knee pain, bilateral 11/25/2010  . Depression 11/25/2010  . Heart murmur 11/25/2010  . Physical exam 07/14/2010  . GERD (gastroesophageal reflux disease) 06/15/2010  . Hyperlipidemia 06/15/2010   Past Medical History:  Diagnosis Date  . AI (aortic insufficiency)    mild-moderate AI  . Anemia   . Arthritis   . Diverticulitis   . GERD (gastroesophageal reflux disease)   . Hyperlipidemia   . Hypothyroidism   . Leg swelling   . Osteoporosis     Family History  Problem Relation Age of Onset  . Alcohol abuse Brother   . Alcohol abuse Sister   . Arthritis Mother   . Stroke Mother   . Hypertension Mother   . Arthritis Father   . Heart disease Father   . Hyperlipidemia Brother   . Hyperlipidemia Sister   . Sudden death Brother   . Mental illness Sister   . Diabetes Sister   . Cancer Daughter        breast    Past Surgical History:  Procedure Laterality Date  . EYE SURGERY Bilateral    cataract removal  . NO PAST SURGERIES    . TOTAL KNEE ARTHROPLASTY Left 01/09/2017   Procedure: TOTAL KNEE ARTHROPLASTY;  Surgeon: Gean Birchwood, MD;  Location: [facility] OR;  Service: Orthopedics;  Laterality: Left;  . TOTAL KNEE ARTHROPLASTY Right 11/23/2020   Procedure: RIGHT TOTAL KNEE ARTHROPLASTY;  Surgeon: Gean Birchwood, MD;  Location: [facility] ORS;  Service: Orthopedics;  Laterality: Right;   Social History   Occupational History  . Occupation: retired  Tobacco  Use  . Smoking status: Never  . Smokeless tobacco: Never  Vaping Use  . Vaping Use: Never used  Substance and Sexual Activity  . Alcohol use: No  . Drug use: No  . Sexual activity: Not Currently

## 2022-08-11 DIAGNOSIS — H469 Unspecified optic neuritis: Secondary | ICD-10-CM | POA: Diagnosis not present

## 2022-08-12 ENCOUNTER — Other Ambulatory Visit: Payer: Medicare Other

## 2022-08-12 ENCOUNTER — Ambulatory Visit: Payer: Medicare Other | Admitting: Family Medicine

## 2022-08-16 ENCOUNTER — Other Ambulatory Visit: Payer: Self-pay | Admitting: Family Medicine

## 2022-08-17 NOTE — Therapy (Signed)
OUTPATIENT PHYSICAL THERAPY SHOULDER EVALUATION   Patient Name: Tonya Johns MRN: [id number] DOB:09-Feb-1930, 89 y.o., female Today's Date: 08/19/2022  END OF SESSION:  PT End of Session - 08/19/22 1627     Visit Number 1    Number of Visits 12    Date for PT Re-Evaluation 10/14/22    Progress Note Due on Visit 10    PT Start Time 1345    PT Stop Time 1430    PT Time Calculation (min) 45 min    Activity Tolerance Patient tolerated treatment well;No increased pain    Behavior During Therapy WFL for tasks assessed/performed             Past Medical History:  Diagnosis Date   AI (aortic insufficiency)    mild-moderate AI   Anemia    Arthritis    Diverticulitis    GERD (gastroesophageal reflux disease)    Hyperlipidemia    Hypothyroidism    Leg swelling    Osteoporosis    Past Surgical History:  Procedure Laterality Date   EYE SURGERY Bilateral    cataract removal   NO PAST SURGERIES     TOTAL KNEE ARTHROPLASTY Left 01/09/2017   Procedure: TOTAL KNEE ARTHROPLASTY;  Surgeon: Gean Birchwood, MD;  Location: [facility];  Service: Orthopedics;  Laterality: Left;   TOTAL KNEE ARTHROPLASTY Right 11/23/2020   Procedure: RIGHT TOTAL KNEE ARTHROPLASTY;  Surgeon: Gean Birchwood, MD;  Location: [facility];  Service: Orthopedics;  Laterality: Right;   Patient Active Problem List   Diagnosis Date Noted   COVID 02/09/2022   Arthritis of right knee 11/23/2020   Osteoarthritis of right knee 11/19/2020   Incomplete left bundle branch block (LBBB) 10/08/2020   Nonrheumatic aortic valve insufficiency 10/08/2020   Obesity (BMI 30-39.9) 01/07/2019   Abnormal EKG 03/23/2018   Dizziness 03/23/2018   Gait instability 03/23/2018   Bradycardia 03/23/2018   Primary osteoarthritis of left knee 01/09/2017   Degenerative arthritis of left knee 01/05/2017   Elevated blood pressure reading in office without diagnosis of hypertension 03/09/2015   Lumbar back pain 05/22/2014   Uterine prolapse 02/19/2014    Hypothyroidism following radioiodine therapy 07/26/2011   Elevated glucose 04/07/2011   Multinodular goiter 12/10/2010   Knee pain, bilateral 11/25/2010   Depression 11/25/2010   Heart murmur 11/25/2010   Physical exam 07/14/2010   GERD (gastroesophageal reflux disease) 06/15/2010   Hyperlipidemia 06/15/2010    PCP: Sheliah Hatch, MD  REFERRING PROVIDER: Tarry Kos, MD  REFERRING DIAG:  Diagnosis  M25.511,G89.29,M25.512 (ICD-10-CM) - Chronic pain of both shoulders    THERAPY DIAG:  Abnormal posture  Localized edema  Muscle weakness (generalized)  Stiffness of left shoulder, not elsewhere classified  Stiffness of right shoulder, not elsewhere classified  Chronic left shoulder pain  Chronic right shoulder pain  Rationale for Evaluation and Treatment: Rehabilitation  ONSET DATE: Chronic  SUBJECTIVE:  SUBJECTIVE STATEMENT: [name] notes bilateral shoulder pain, particularly with cold and rainy weather.  Left > right.  She has to drop down to wash her hair secondary to difficulty raising her arms overhead.  She notes difficulty with reaching and overhead function.  Hand dominance: Right  PERTINENT HISTORY: Aortic insufficiency, anemia, HLD, hypothyroid, osteoporosis, Bil cataract surgery, Bil TKAs, obesity  PAIN:  Are you having pain? Yes: NPRS scale: 2-4/10 this week on a 10/10 Pain location: Left > right shoulder Pain description: Dull ache Aggravating factors: Sleep, reaching and overhead function Relieving factors: Tylenol, heat  PRECAUTIONS: Back  RED FLAGS: None   WEIGHT BEARING RESTRICTIONS: Yes Minimize UE WB  FALLS:  Has patient fallen in last 6 months? No  LIVING ENVIRONMENT: Lives with: lives with their family and lives with their son Lives in:  House/apartment Stairs:  Has to use her arms on the handrails Has following equipment at home: Single point cane  OCCUPATION: Retired  PLOF: Independent with basic ADLs  PATIENT GOALS:Use cane without pain, wash hair without bending over  NEXT MD VISIT: If needed  OBJECTIVE:   DIAGNOSTIC FINDINGS:  Mild degenerative changes appropriate for age.  No acute abnormalities.  Both shoulders.  PATIENT SURVEYS:  FOTO 50 (risk adjusted 48, goal 58 in 11 visits)  COGNITION: Overall cognitive status: Within functional limits for tasks assessed     SENSATION: Minimal tingling and paresthesias  POSTURE: Significant for forward head, internally rotated and protracted shoulders and forward flexed trunk  UPPER EXTREMITY ROM:   Passive ROM Left/Right 08/19/2022   Shoulder flexion 125/130   Shoulder extension    Shoulder abduction    Shoulder horizontal adduction 10/25   Shoulder internal rotation 40/50   Shoulder external rotation 55/90   Elbow flexion    Elbow extension    Wrist flexion    Wrist extension    Wrist ulnar deviation    Wrist radial deviation    Wrist pronation    Wrist supination    (Blank rows = not tested)  UPPER EXTREMITY STRENGTH:  In pounds assessed with hand-held dynamometer Left/Right 08/19/2022   Shoulder flexion    Shoulder extension    Shoulder abduction    Shoulder adduction    Shoulder internal rotation 10.6/9.6   Shoulder external rotation 5.8/4.7   Middle trapezius    Lower trapezius    Elbow flexion    Elbow extension    Wrist flexion    Wrist extension    Wrist ulnar deviation    Wrist radial deviation    Wrist pronation    Wrist supination    Grip strength (lbs)    (Blank rows = not tested)  TODAY'S TREATMENT:                                                                                                                                         DATE: 08/19/2022  Supine arm raises 20 x 3 seconds with 2 pounds Shoulder blade  pinches Thera-band ER red 10 times bilaterally with slow eccentric's Thera-band IR read 10 times bilaterally with slow eccentric's   PATIENT EDUCATION: Education details: Reviewed exam findings and home exercise program Person educated: Patient Education method: Explanation, Demonstration, Tactile cues, Verbal cues, and Handouts Education comprehension: verbalized understanding, returned demonstration, verbal cues required, tactile cues required, and needs further education  HOME EXERCISE PROGRAM: [id number]  ASSESSMENT:  CLINICAL IMPRESSION: Patient is a 89 y.o. female who was seen today for physical therapy evaluation and treatment for chronic bilateral shoulder pain.  Along with limitations in bilateral shoulder active range of motion and capsular flexibility, Ethelyne has a very weak shoulders bilaterally.  Functional strengthening with emphasis with today's start her home exercise program and will likely allow her to participate more fully with active range of motion and flexibility activities with less pain.  OBJECTIVE IMPAIRMENTS: decreased activity tolerance, decreased endurance, decreased knowledge of condition, difficulty walking, decreased ROM, decreased strength, decreased safety awareness, increased edema, impaired perceived functional ability, impaired flexibility, impaired UE functional use, improper body mechanics, postural dysfunction, and pain.   ACTIVITY LIMITATIONS: carrying, lifting, dressing, reach over head, and hygiene/grooming  PARTICIPATION LIMITATIONS: meal prep and community activity  PERSONAL FACTORS: Aortic insufficiency, anemia, HLD, hypothyroid, osteoporosis, Bil cataract surgery, Bil TKAs, obesity are also affecting patient's functional outcome.   REHAB POTENTIAL: Good  CLINICAL DECISION MAKING: Stable/uncomplicated  EVALUATION COMPLEXITY: Low   GOALS: Goals reviewed with patient? Yes  SHORT TERM GOALS: Target date: 09/16/2022  Improve bilateral  shoulder AROM for flexion to 135 degrees, horizontal adduction to 30 degrees, internal rotation to 50 degrees and external rotation 90 degrees Baseline:  L/R in degrees flexion 125/130; horizontal adduction 10/25; internal rotation 40/50; external rotation 55/90 Goal status: INITIAL  2.  Aailyah will be independent with her day 1 HEP Baseline: Started 08/17/2022 Goal status: INITIAL  3.  Improve bilateral shoulder strength is assessed by hand-held dynamometer and compared to evaluation Baseline: L/R in pounds: IR 10.6/9.6 and ER 5.8/4.7 Goal status: INITIAL   LONG TERM GOALS: Target date: 10/14/2022  Improve FOTO to 58 in 11 visits Baseline: 50, risk adjusted 48 Goal status: INITIAL  2.  Improve bilateral shoulder pain to consistently 0-3 out of 10 on the Numeric Pain Rating Scale Baseline: 2-4 out of 10 Goal status: INITIAL  3.  Improve bilateral shoulder strength to 15 pounds of internal rotation and 10 pounds of external rotation Baseline: See objective Goal status: INITIAL  4.  Zanylah will be able to wash her face and hair without having to drop her head due to improved overhead function Baseline: Has to get in a very flexed posture to do these ADLs currently Goal status: INITIAL  5.  Jodiann will be independent with her long-term maintenance home exercise program at discharge Baseline: Started 08/19/2022 Goal status: INITIAL   PLAN:  PT FREQUENCY: 1-2x/week  PT DURATION: 8 weeks  PLANNED INTERVENTIONS: Therapeutic exercises, Therapeutic activity, Neuromuscular re-education, Patient/Family education, Self Care, Joint mobilization, Dry Needling, Cryotherapy, Vasopneumatic device, and Manual therapy  PLAN FOR NEXT SESSION: Review day 1 home exercise program.  Early emphasis on functional active range of motion and strength with progression into more specific active range of motion and flexibility Activities As Her Comfort Level Allows   Cherlyn Cushing, PT, MPT 08/19/2022, 4:40 PM

## 2022-08-17 NOTE — Telephone Encounter (Signed)
Would you like to refill this medication ? It states a historical provider placed order 3 weeks ago

## 2022-08-18 ENCOUNTER — Other Ambulatory Visit (INDEPENDENT_AMBULATORY_CARE_PROVIDER_SITE_OTHER): Payer: Medicare Other

## 2022-08-18 DIAGNOSIS — E785 Hyperlipidemia, unspecified: Secondary | ICD-10-CM | POA: Diagnosis not present

## 2022-08-18 DIAGNOSIS — R944 Abnormal results of kidney function studies: Secondary | ICD-10-CM | POA: Diagnosis not present

## 2022-08-18 LAB — BASIC METABOLIC PANEL
BUN: 36 mg/dL — ABNORMAL HIGH (ref 6–23)
CO2: 29 mEq/L (ref 19–32)
Calcium: 9.7 mg/dL (ref 8.4–10.5)
Chloride: 101 mEq/L (ref 96–112)
Creatinine, Ser: 1.58 mg/dL — ABNORMAL HIGH (ref 0.40–1.20)
GFR: 28.7 mL/min — ABNORMAL LOW (ref >60.00)
Glucose, Bld: 110 mg/dL — ABNORMAL HIGH (ref 70–99)
Potassium: 4.5 mEq/L (ref 3.5–5.1)
Sodium: 137 mEq/L (ref 135–145)

## 2022-08-18 LAB — LIPID PANEL
Cholesterol: 218 mg/dL — ABNORMAL HIGH (ref 0–200)
HDL: 68.5 mg/dL (ref >39.00)
LDL Cholesterol: 130 mg/dL — ABNORMAL HIGH (ref 0–99)
NonHDL: 149.16
Total CHOL/HDL Ratio: 3
Triglycerides: 95 mg/dL (ref 0.0–149.0)
VLDL: 19 mg/dL (ref 0.0–40.0)

## 2022-08-19 ENCOUNTER — Encounter: Payer: Self-pay | Admitting: Rehabilitative and Restorative Service Providers"

## 2022-08-19 ENCOUNTER — Other Ambulatory Visit: Payer: Self-pay

## 2022-08-19 ENCOUNTER — Telehealth: Payer: Self-pay

## 2022-08-19 ENCOUNTER — Ambulatory Visit (INDEPENDENT_AMBULATORY_CARE_PROVIDER_SITE_OTHER): Payer: Medicare Other | Admitting: Rehabilitative and Restorative Service Providers"

## 2022-08-19 DIAGNOSIS — R293 Abnormal posture: Secondary | ICD-10-CM | POA: Diagnosis not present

## 2022-08-19 DIAGNOSIS — G8929 Other chronic pain: Secondary | ICD-10-CM

## 2022-08-19 DIAGNOSIS — M25611 Stiffness of right shoulder, not elsewhere classified: Secondary | ICD-10-CM | POA: Diagnosis not present

## 2022-08-19 DIAGNOSIS — M25612 Stiffness of left shoulder, not elsewhere classified: Secondary | ICD-10-CM | POA: Diagnosis not present

## 2022-08-19 DIAGNOSIS — M25512 Pain in left shoulder: Secondary | ICD-10-CM | POA: Diagnosis not present

## 2022-08-19 DIAGNOSIS — M25511 Pain in right shoulder: Secondary | ICD-10-CM | POA: Diagnosis not present

## 2022-08-19 DIAGNOSIS — M6281 Muscle weakness (generalized): Secondary | ICD-10-CM

## 2022-08-19 DIAGNOSIS — R600 Localized edema: Secondary | ICD-10-CM | POA: Diagnosis not present

## 2022-08-19 DIAGNOSIS — R944 Abnormal results of kidney function studies: Secondary | ICD-10-CM

## 2022-08-19 NOTE — Telephone Encounter (Signed)
-----   Message from Neena Rhymes sent at 08/19/2022  7:38 AM EDT ----- Cholesterol looks MUCH better!  Continue your medication daily  Your kidney function remains abnormal (elevated Creatinine and decreased GFR).  Are you drinking enough water?  Particularly in this heat it is very important.  So please increase your water intake and we're going to place a referral for Nephrology (kidney specialist).  Since I'm not sure how long it will take to see the specialist, we are going to repeat your BMP at a lab only visit in 1-2 weeks (dx decreased GFR)

## 2022-08-19 NOTE — Telephone Encounter (Signed)
Pt aware of lab results and was agreeable to Nephrology referral that has been placed and lab only visit has been made . Repeat BMP order is in

## 2022-08-25 ENCOUNTER — Ambulatory Visit: Payer: Medicare Other | Admitting: Rehabilitative and Restorative Service Providers"

## 2022-08-25 ENCOUNTER — Ambulatory Visit (INDEPENDENT_AMBULATORY_CARE_PROVIDER_SITE_OTHER): Payer: Medicare Other | Admitting: *Deleted

## 2022-08-25 ENCOUNTER — Encounter: Payer: Self-pay | Admitting: Rehabilitative and Restorative Service Providers"

## 2022-08-25 DIAGNOSIS — M6281 Muscle weakness (generalized): Secondary | ICD-10-CM

## 2022-08-25 DIAGNOSIS — M25511 Pain in right shoulder: Secondary | ICD-10-CM | POA: Diagnosis not present

## 2022-08-25 DIAGNOSIS — R293 Abnormal posture: Secondary | ICD-10-CM

## 2022-08-25 DIAGNOSIS — R600 Localized edema: Secondary | ICD-10-CM | POA: Diagnosis not present

## 2022-08-25 DIAGNOSIS — M25612 Stiffness of left shoulder, not elsewhere classified: Secondary | ICD-10-CM

## 2022-08-25 DIAGNOSIS — Z0000 Encounter for general adult medical examination without abnormal findings: Secondary | ICD-10-CM | POA: Diagnosis not present

## 2022-08-25 DIAGNOSIS — M25512 Pain in left shoulder: Secondary | ICD-10-CM

## 2022-08-25 DIAGNOSIS — G8929 Other chronic pain: Secondary | ICD-10-CM | POA: Diagnosis not present

## 2022-08-25 DIAGNOSIS — M25611 Stiffness of right shoulder, not elsewhere classified: Secondary | ICD-10-CM | POA: Diagnosis not present

## 2022-08-25 NOTE — Progress Notes (Signed)
Subjective:   Tonya Johns is a 89 y.o. female who presents for Medicare Annual (Subsequent) preventive examination.  Visit Complete: Virtual  I connected with  [name] on 08/25/22 by a audio enabled telemedicine application and verified that I am speaking with the correct person using two identifiers.  Patient Location: Home  Provider Location: Home Office  I discussed the limitations of evaluation and management by telemedicine. The patient expressed understanding and agreed to proceed.  Patient Medicare AWV questionnaire was completed by the patient on 08-24-2022; I have confirmed that all information answered by patient is correct and no changes since this date.  Unable to obtain vitals patient not in clinic  Review of Systems     Cardiac Risk Factors include: advanced age (>65men, >81 women)     Objective:    Today's Vitals   08/25/22 0816  PainSc: 6    There is no height or weight on file to calculate BMI.     08/25/2022    8:17 AM 08/18/2021    3:10 PM 11/23/2020    5:00 PM 11/23/2020    9:39 AM 11/20/2020    8:21 AM 08/17/2020    2:26 PM 08/13/2019   12:01 PM  Advanced Directives  Does Patient Have a Medical Advance Directive? Yes Yes Yes Yes Yes Yes Yes  Type of Estate agent of [employer] Power of [place];Living will Healthcare Power of [place];Living will Healthcare Power of [place];Living will Healthcare Power of [place];Living will Healthcare Power of Attorney Living will;Healthcare Power of Attorney  Does patient want to make changes to medical advance directive?   No - Patient declined No - Patient declined No - Patient declined    Copy of Healthcare Power of Attorney in Chart? Yes - validated most recent copy scanned in chart (See row information) No - copy requested No - copy requested No - copy requested No - copy requested Yes - validated most recent copy scanned in chart (See row information) No - copy requested     Current Medications (verified) Outpatient Encounter Medications as of 08/25/2022  Medication Sig   aspirin EC 81 MG tablet Take 1 tablet (81 mg total) by mouth 2 (two) times daily.   Camphor-Menthol-Methyl Sal (SALONPAS) 3.02-06-08 % PTCH Apply 1 patch topically daily as needed (pain).   Coenzyme Q10 (CO Q10) 100 MG CAPS Take 100 mg by mouth at bedtime.   famotidine (PEPCID) 20 MG tablet TAKE 1 TABLET BY MOUTH DAILY   feeding supplement, GLUCERNA SHAKE, (GLUCERNA SHAKE) LIQD Take 237 mLs by mouth 2 (two) times daily.   FLUoxetine (PROZAC) 10 MG capsule TAKE 1 CAPSULE BY MOUTH DAILY   levothyroxine (SYNTHROID) 100 MCG tablet TAKE 1 TABLET BY MOUTH DAILY   Menthol, Topical Analgesic, (BIOFREEZE ROLL-ON) 4 % GEL Apply 1 application topically daily as needed (pain).   Menthol, Topical Analgesic, (BLUE-EMU MAXIMUM STRENGTH) 2.5 % LIQD Apply 1 application topically daily as needed (pain).   Multiple Vitamin (MULTIVITAMIN WITH MINERALS) TABS tablet Take 1 tablet by mouth at bedtime.   pravastatin (PRAVACHOL) 80 MG tablet TAKE 1 TABLET BY MOUTH DAILY   No facility-administered encounter medications on file as of 08/25/2022.    Allergies (verified) Penicillins   History: Past Medical History:  Diagnosis Date   AI (aortic insufficiency)    mild-moderate AI   Anemia    Arthritis    Diverticulitis    GERD (gastroesophageal reflux disease)    Hyperlipidemia    Hypothyroidism  Leg swelling    Osteoporosis    Past Surgical History:  Procedure Laterality Date   EYE SURGERY Bilateral    cataract removal   NO PAST SURGERIES     TOTAL KNEE ARTHROPLASTY Left 01/09/2017   Procedure: TOTAL KNEE ARTHROPLASTY;  Surgeon: Gean Birchwood, MD;  Location: [facility];  Service: Orthopedics;  Laterality: Left;   TOTAL KNEE ARTHROPLASTY Right 11/23/2020   Procedure: RIGHT TOTAL KNEE ARTHROPLASTY;  Surgeon: Gean Birchwood, MD;  Location: [facility];  Service: Orthopedics;  Laterality: Right;   Family History   Problem Relation Age of Onset   Alcohol abuse Brother    Alcohol abuse Sister    Arthritis Mother    Stroke Mother    Hypertension Mother    Arthritis Father    Heart disease Father    Hyperlipidemia Brother    Hyperlipidemia Sister    Sudden death Brother    Mental illness Sister    Diabetes Sister    Cancer Daughter        breast   Social History   Socioeconomic History   Marital status: Single    Spouse name: Not on file   Number of children: 3   Years of education: Not on file   Highest education level: Not on file  Occupational History   Occupation: retired  Tobacco Use   Smoking status: Never   Smokeless tobacco: Never  Vaping Use   Vaping status: Never Used  Substance and Sexual Activity   Alcohol use: No   Drug use: No   Sexual activity: Not Currently  Other Topics Concern   Not on file  Social History Narrative   Not on file   Social Determinants of Health   Financial Resource Strain: Low Risk  (08/25/2022)   Overall Financial Resource Strain (CARDIA)    Difficulty of Paying Living Expenses: Not hard at all  Food Insecurity: No Food Insecurity (08/25/2022)   Hunger Vital Sign    Worried About Running Out of Food in the Last Year: Never true    Ran Out of Food in the Last Year: Never true  Transportation Needs: No Transportation Needs (08/25/2022)   PRAPARE - Administrator, Civil Service (Medical): No    Lack of Transportation (Non-Medical): No  Physical Activity: Inactive (08/25/2022)   Exercise Vital Sign    Days of Exercise per Week: 0 days    Minutes of Exercise per Session: 0 min  Stress: No Stress Concern Present (08/25/2022)   [employer] of Occupational Health - Occupational Stress Questionnaire    Feeling of Stress : Only a little  Social Connections: Moderately Integrated (08/25/2022)   Social Connection and Isolation Panel [NHANES]    Frequency of Communication with Friends and Family: More than three times a week     Frequency of Social Gatherings with Friends and Family: More than three times a week    Attends Religious Services: More than 4 times per year    Active Member of [employer] or Organizations: Yes    Attends Banker Meetings: More than 4 times per year    Marital Status: Widowed    Tobacco Counseling Counseling given: Not Answered   Clinical Intake:  Pre-visit preparation completed: Yes  Pain : 0-10 Pain Score: 6  Pain Type: Chronic pain Pain Location: Shoulder Pain Orientation: Left, Right Pain Descriptors / Indicators: Burning, Aching, Constant Pain Onset: More than a month ago Pain Frequency: Constant Pain Relieving Factors: tylenol/ heat  Pain  Relieving Factors: tylenol/ heat  Diabetes: No  How often do you need to have someone help you when you read instructions, pamphlets, or other written materials from your doctor or pharmacy?: 2 - Rarely  Interpreter Needed?: No  Information entered by :: Remi Haggard LPN   Activities of Daily Living    08/25/2022    8:19 AM 08/24/2022    4:23 PM  In your present state of health, do you have any difficulty performing the following activities:  Hearing? 1 1  Vision? 1 1  Difficulty concentrating or making decisions? 1 1  Walking or climbing stairs? 1 1  Dressing or bathing? 0 0  Doing errands, shopping? 1 1  Preparing Food and eating ? Y Y  Using the Toilet? N N  In the past six months, have you accidently leaked urine? Y N  Do you have problems with loss of bowel control? N N  Managing your Medications? N N  Managing your Finances? Malvin Johns  Housekeeping or managing your Housekeeping? Malvin Johns    Patient Care Team: Sheliah Hatch, MD as PCP - General (Family Medicine) Jodelle Red, MD as PCP - Cardiology (Cardiology) Romero Belling, MD (Inactive) as Consulting Physician (Endocrinology) Gean Birchwood, MD as Consulting Physician (Orthopedic Surgery) Erroll Luna, [facility] (Inactive) (Pharmacist)  Indicate  any recent Medical Services you may have received from other than [facility] providers in the past year (date may be approximate).     Assessment:   This is a routine wellness examination for Tonya Johns.  Hearing/Vision screen Hearing Screening - Comments:: Some trouble hearing Does not wear hearing aids Vision Screening - Comments:: Up to date [place]  Dietary issues and exercise activities discussed:     Goals Addressed             This Visit's Progress    Patient Stated       Eat Healthier       Depression Screen    08/25/2022    8:24 AM 07/26/2022    9:44 AM 02/09/2022   10:13 AM 08/18/2021    3:11 PM 08/18/2021    3:09 PM 07/23/2021    1:17 PM 01/18/2021   10:34 AM  PHQ 2/9 Scores  PHQ - 2 Score 2 2 4  0 0 0 1  PHQ- 9 Score 11 10 15   2 5     Fall Risk    08/25/2022    8:17 AM 08/24/2022    4:23 PM 07/26/2022    9:44 AM 02/09/2022   10:14 AM 08/18/2021    3:11 PM  Fall Risk   Falls in the past year? 1 1 1 1  0  Number falls in past yr: 1 1 0 0 0  Injury with Fall? 1 0 0 0 0  Risk for fall due to : Impaired mobility  History of fall(s) History of fall(s) No Fall Risks  Follow up Falls evaluation completed;Education provided;Falls prevention discussed  Falls evaluation completed Falls evaluation completed Falls evaluation completed;Education provided    MEDICARE RISK AT HOME:   TIMED UP AND GO:  Was the test performed?  No    Cognitive Function:    07/13/2018    2:12 PM 03/23/2017    9:05 AM 09/05/2014    3:14 PM  MMSE - Mini Mental State Exam  Not completed: Unable to complete    Orientation to time  5 5  Orientation to Place  5 5  Registration  3 3  Attention/ Calculation  4 5  Recall  3 2  Language- name 2 objects  2 2  Language- repeat  1 1  Language- follow 3 step command  3 3  Language- read & follow direction  1 1  Write a sentence  1 1  Copy design  0 1  Total score  28 29        08/25/2022    8:21 AM 08/13/2019   12:04 PM  6CIT Screen  What  Year? 4 points 0 points  What month? 0 points 0 points  What time? 0 points 0 points  Count back from 20 0 points 0 points  Months in reverse 4 points 0 points  Repeat phrase 0 points 2 points  Total Score 8 points 2 points    Immunizations Immunization History  Administered Date(s) Administered   COVID-19, mRNA, vaccine(Comirnaty)12 years and older 11/16/2021   Fluad Quad(high Dose 65+) 10/02/2018, 09/29/2020, 11/16/2021   Influenza Split 11/25/2010, 11/16/2011   Influenza,inj,Quad PF,6+ Mos 12/06/2012, 11/07/2013, 10/23/2014, 11/26/2015, 09/20/2016, 09/30/2017   Influenza-Unspecified 10/15/2019   PFIZER Comirnaty(Gray Top)Covid-19 Tri-Sucrose Vaccine 06/15/2020   PFIZER(Purple Top)SARS-COV-2 Vaccination 02/10/2019, 03/03/2019, 10/15/2019   Pfizer Covid-19 Vaccine Bivalent Booster 38yrs & up 10/27/2020   Pneumococcal Conjugate-13 02/19/2014   Pneumococcal Polysaccharide-23 03/10/2016   Tdap 07/15/2019   Zoster Recombinant(Shingrix) 04/28/2017, 09/30/2017    TDAP status: Up to date  Flu Vaccine status: Up to date  Pneumococcal vaccine status: Up to date  Covid-19 vaccine status: Information provided on how to obtain vaccines.   Qualifies for Shingles Vaccine? No   Zostavax completed Yes   Shingrix Completed?: Yes  Screening Tests Health Maintenance  Topic Date Due   INFLUENZA VACCINE  09/01/2022   Medicare Annual Wellness (AWV)  08/25/2023   DTaP/Tdap/Td (2 - Td or Tdap) 07/14/2029   Pneumonia Vaccine 52+ Years old  Completed   DEXA SCAN  Completed   Zoster Vaccines- Shingrix  Completed   HPV VACCINES  Aged Out   COVID-19 Vaccine  Discontinued    Health Maintenance  There are no preventive care reminders to display for this patient.   Colorectal cancer screening: No longer required.   Mammogram status: No longer required due to age.  Bone Density  No longer required due to age  Lung Cancer Screening: (Low Dose CT Chest recommended if Age 71-80 years, 20  pack-year currently smoking OR have quit w/in 15years.) does not qualify.   Lung Cancer Screening Referral:   Additional Screening:  Hepatitis C Screening: does not qualify;   Vision Screening: Recommended annual ophthalmology exams for early detection of glaucoma and other disorders of the eye. Is the patient up to date with their annual eye exam?  Yes  Who is the provider or what is the name of the office in which the patient attends annual eye exams? [facility] If pt is not established with a provider, would they like to be referred to a provider to establish care? No .   Dental Screening: Recommended annual dental exams for proper oral hygiene    Community Resource Referral / Chronic Care Management: CRR required this visit?  No   CCM required this visit?  No     Plan:     I have personally reviewed and noted the following in the patient's chart:   Medical and social history Use of alcohol, tobacco or illicit drugs  Current medications and supplements including opioid prescriptions. Patient is not currently taking opioid prescriptions. Functional ability and status Nutritional  status Physical activity Advanced directives List of other physicians Hospitalizations, surgeries, and ER visits in previous 12 months Vitals Screenings to include cognitive, depression, and falls Referrals and appointments  In addition, I have reviewed and discussed with patient certain preventive protocols, quality metrics, and best practice recommendations. A written personalized care plan for preventive services as well as general preventive health recommendations were provided to patient.     Remi Haggard, LPN   10/15/7827   After Visit Summary: (MyChart) Due to this being a telephonic visit, the after visit summary with patients personalized plan was offered to patient via MyChart   Nurse Notes:

## 2022-08-25 NOTE — Therapy (Signed)
OUTPATIENT PHYSICAL THERAPY SHOULDER TREATMENT   Patient Name: Tonya Johns MRN: [id number] DOB:28-Sep-1930, 89 y.o., female Today's Date: 08/25/2022  END OF SESSION:  PT End of Session - 08/25/22 1047     Visit Number 2    Number of Visits 12    Date for PT Re-Evaluation 10/14/22    Progress Note Due on Visit 10    PT Start Time 1046    PT Stop Time 1128    PT Time Calculation (min) 42 min    Activity Tolerance Patient tolerated treatment well;No increased pain    Behavior During Therapy WFL for tasks assessed/performed              Past Medical History:  Diagnosis Date   AI (aortic insufficiency)    mild-moderate AI   Anemia    Arthritis    Diverticulitis    GERD (gastroesophageal reflux disease)    Hyperlipidemia    Hypothyroidism    Leg swelling    Osteoporosis    Past Surgical History:  Procedure Laterality Date   EYE SURGERY Bilateral    cataract removal   NO PAST SURGERIES     TOTAL KNEE ARTHROPLASTY Left 01/09/2017   Procedure: TOTAL KNEE ARTHROPLASTY;  Surgeon: Gean Birchwood, MD;  Location: [facility];  Service: Orthopedics;  Laterality: Left;   TOTAL KNEE ARTHROPLASTY Right 11/23/2020   Procedure: RIGHT TOTAL KNEE ARTHROPLASTY;  Surgeon: Gean Birchwood, MD;  Location: [facility];  Service: Orthopedics;  Laterality: Right;   Patient Active Problem List   Diagnosis Date Noted   COVID 02/09/2022   Arthritis of right knee 11/23/2020   Osteoarthritis of right knee 11/19/2020   Incomplete left bundle branch block (LBBB) 10/08/2020   Nonrheumatic aortic valve insufficiency 10/08/2020   Obesity (BMI 30-39.9) 01/07/2019   Abnormal EKG 03/23/2018   Dizziness 03/23/2018   Gait instability 03/23/2018   Bradycardia 03/23/2018   Primary osteoarthritis of left knee 01/09/2017   Degenerative arthritis of left knee 01/05/2017   Elevated blood pressure reading in office without diagnosis of hypertension 03/09/2015   Lumbar back pain 05/22/2014   Uterine prolapse 02/19/2014    Hypothyroidism following radioiodine therapy 07/26/2011   Elevated glucose 04/07/2011   Multinodular goiter 12/10/2010   Knee pain, bilateral 11/25/2010   Depression 11/25/2010   Heart murmur 11/25/2010   Physical exam 07/14/2010   GERD (gastroesophageal reflux disease) 06/15/2010   Hyperlipidemia 06/15/2010    PCP: Sheliah Hatch, MD  REFERRING PROVIDER: Tarry Kos, MD  REFERRING DIAG:  Diagnosis  M25.511,G89.29,M25.512 (ICD-10-CM) - Chronic pain of both shoulders    THERAPY DIAG:  Abnormal posture  Localized edema  Muscle weakness (generalized)  Stiffness of left shoulder, not elsewhere classified  Stiffness of right shoulder, not elsewhere classified  Chronic left shoulder pain  Chronic right shoulder pain  Rationale for Evaluation and Treatment: Rehabilitation  ONSET DATE: Chronic  SUBJECTIVE:  SUBJECTIVE STATEMENT: Tonya Johns notes "fair" early HEP compliance.  She has been doing a good job with her bands and shoulder blade pinches but she has not been using weights with supine arm raises.    Hand dominance: Right  PERTINENT HISTORY: Aortic insufficiency, anemia, HLD, hypothyroid, osteoporosis, Bil cataract surgery, Bil TKAs, obesity  PAIN:  Are you having pain? Yes: NPRS scale: 2-4/10 this week on a 10/10 Pain location: Left > right shoulder Pain description: Dull ache Aggravating factors: Sleep, reaching and overhead function Relieving factors: Tylenol, heat  PRECAUTIONS: Back  RED FLAGS: None   WEIGHT BEARING RESTRICTIONS: Yes Minimize UE WB  FALLS:  Has patient fallen in last 6 months? No  LIVING ENVIRONMENT: Lives with: lives with their family and lives with their son Lives in: House/apartment Stairs:  Has to use her arms on the handrails Has following  equipment at home: Single point cane  OCCUPATION: Retired  PLOF: Independent with basic ADLs  PATIENT GOALS:Use cane without pain, wash hair without bending over  NEXT MD VISIT: If needed  OBJECTIVE:   DIAGNOSTIC FINDINGS:  Mild degenerative changes appropriate for age.  No acute abnormalities.  Both shoulders.  PATIENT SURVEYS:  FOTO 50 (risk adjusted 48, goal 58 in 11 visits)  COGNITION: Overall cognitive status: Within functional limits for tasks assessed     SENSATION: Minimal tingling and paresthesias  POSTURE: Significant for forward head, internally rotated and protracted shoulders and forward flexed trunk  UPPER EXTREMITY ROM:   Passive ROM Left/Right 08/19/2022   Shoulder flexion 125/130   Shoulder extension    Shoulder abduction    Shoulder horizontal adduction 10/25   Shoulder internal rotation 40/50   Shoulder external rotation 55/90   Elbow flexion    Elbow extension    Wrist flexion    Wrist extension    Wrist ulnar deviation    Wrist radial deviation    Wrist pronation    Wrist supination    (Blank rows = not tested)  UPPER EXTREMITY STRENGTH:  In pounds assessed with hand-held dynamometer Left/Right 08/19/2022   Shoulder flexion    Shoulder extension    Shoulder abduction    Shoulder adduction    Shoulder internal rotation 10.6/9.6   Shoulder external rotation 5.8/4.7   Middle trapezius    Lower trapezius    Elbow flexion    Elbow extension    Wrist flexion    Wrist extension    Wrist ulnar deviation    Wrist radial deviation    Wrist pronation    Wrist supination    Grip strength (lbs)    (Blank rows = not tested)  TODAY'S TREATMENT:                                                                                                                                         DATE:  08/25/2022 Supine arm raises 2 sets of  20 x 3 seconds with 2 pounds Shoulder blade pinches 2 sets of 10 x 5 seconds Thera-band ER red 2 sets of 10 times  bilaterally with slow eccentrics Thera-band IR red 2 sets of 10 times bilaterally with slow eccentrics Trunk extension AROM 2 sets of 10 for 3 seconds   08/19/2022 Supine arm raises 20 x 3 seconds with 2 pounds Shoulder blade pinches 10 x 5 seconds Thera-band ER red 10 times bilaterally with slow eccentric's Thera-band IR read 10 times bilaterally with slow eccentric's   PATIENT EDUCATION: Education details: Reviewed exam findings and home exercise program Person educated: Patient Education method: Explanation, Demonstration, Tactile cues, Verbal cues, and Handouts Education comprehension: verbalized understanding, returned demonstration, verbal cues required, tactile cues required, and needs further education  HOME EXERCISE PROGRAM: [id number]  ASSESSMENT:  CLINICAL IMPRESSION: Tonya Johns has chronic bilateral shoulder pain.  Active range of motion and capsular flexibility are less limited than strength. She did a great job with her exercises today and I added a gentle lumbar extension exercise as her standing is limited by low back pain.  This affects her ability to fully participate in her shoulder rehabilitation.   Continue POC to meet LTGs.  OBJECTIVE IMPAIRMENTS: decreased activity tolerance, decreased endurance, decreased knowledge of condition, difficulty walking, decreased ROM, decreased strength, decreased safety awareness, increased edema, impaired perceived functional ability, impaired flexibility, impaired UE functional use, improper body mechanics, postural dysfunction, and pain.   ACTIVITY LIMITATIONS: carrying, lifting, dressing, reach over head, and hygiene/grooming  PARTICIPATION LIMITATIONS: meal prep and community activity  PERSONAL FACTORS: Aortic insufficiency, anemia, HLD, hypothyroid, osteoporosis, Bil cataract surgery, Bil TKAs, obesity are also affecting patient's functional outcome.   REHAB POTENTIAL: Good  CLINICAL DECISION MAKING:  Stable/uncomplicated  EVALUATION COMPLEXITY: Low   GOALS: Goals reviewed with patient? Yes  SHORT TERM GOALS: Target date: 09/16/2022  Improve bilateral shoulder AROM for flexion to 135 degrees, horizontal adduction to 30 degrees, internal rotation to 50 degrees and external rotation 90 degrees Baseline:  L/R in degrees flexion 125/130; horizontal adduction 10/25; internal rotation 40/50; external rotation 55/90 Goal status: INITIAL  2.  Tonya Johns will be independent with her day 1 HEP Baseline: Started 08/17/2022 Goal status: On Going 08/25/2022  3.  Improve bilateral shoulder strength is assessed by hand-held dynamometer and compared to evaluation Baseline: L/R in pounds: IR 10.6/9.6 and ER 5.8/4.7 Goal status: INITIAL   LONG TERM GOALS: Target date: 10/14/2022  Improve FOTO to 58 in 11 visits Baseline: 50, risk adjusted 48 Goal status: INITIAL  2.  Improve bilateral shoulder pain to consistently 0-3 out of 10 on the Numeric Pain Rating Scale Baseline: 2-4 out of 10 Goal status: On Going 08/25/2022  3.  Improve bilateral shoulder strength to 15 pounds of internal rotation and 10 pounds of external rotation Baseline: See objective Goal status: INITIAL  4.  Tonya Johns will be able to wash her face and hair without having to drop her head due to improved overhead function Baseline: Has to get in a very flexed posture to do these ADLs currently Goal status: On Going 08/25/2022  5.  Tonya Johns will be independent with her long-term maintenance home exercise program at discharge Baseline: Started 08/19/2022 Goal status: INITIAL   PLAN:  PT FREQUENCY: 1-2x/week  PT DURATION: 8 weeks  PLANNED INTERVENTIONS: Therapeutic exercises, Therapeutic activity, Neuromuscular re-education, Patient/Family education, Self Care, Joint mobilization, Dry Needling, Cryotherapy, Vasopneumatic device, and Manual therapy  PLAN FOR NEXT SESSION: Review current home exercise program.  Emphasis on  functional  active range of motion and strength with progression into more specific active range of motion and flexibility Activities As Her Comfort Level Allows   Cherlyn Cushing, PT, MPT 08/25/2022, 11:36 AM

## 2022-08-25 NOTE — Patient Instructions (Signed)
Tonya Johns , Thank you for taking time to come for your Medicare Wellness Visit. I appreciate your ongoing commitment to your health goals. Please review the following plan we discussed and let me know if I can assist you in the future.   Screening recommendations/referrals: Colonoscopy: no longer required Mammogram: no longer required Bone Density: no longer required Recommended yearly ophthalmology/optometry visit for glaucoma screening and checkup Recommended yearly dental visit for hygiene and checkup  Vaccinations: Influenza vaccine: up to date Pneumococcal vaccine: up to date Tdap vaccine: up to date Shingles vaccine: up to date    Advanced directives: on file     Preventive Care 65 Years and Older, Female Preventive care refers to lifestyle choices and visits with your health care provider that can promote health and wellness. What does preventive care include? A yearly physical exam. This is also called an annual well check. Dental exams once or twice a year. Routine eye exams. Ask your health care provider how often you should have your eyes checked. Personal lifestyle choices, including: Daily care of your teeth and gums. Regular physical activity. Eating a healthy diet. Avoiding tobacco and drug use. Limiting alcohol use. Practicing safe sex. Taking low-dose aspirin every day. Taking vitamin and mineral supplements as recommended by your health care provider. What happens during an annual well check? The services and screenings done by your health care provider during your annual well check will depend on your age, overall health, lifestyle risk factors, and family history of disease. Counseling  Your health care provider may ask you questions about your: Alcohol use. Tobacco use. Drug use. Emotional well-being. Home and relationship well-being. Sexual activity. Eating habits. History of falls. Memory and ability to understand (cognition). Work and work  Astronomer. Reproductive health. Screening  You may have the following tests or measurements: Height, weight, and BMI. Blood pressure. Lipid and cholesterol levels. These may be checked every 5 years, or more frequently if you are over 27 years old. Skin check. Lung cancer screening. You may have this screening every year starting at age 13 if you have a 30-pack-year history of smoking and currently smoke or have quit within the past 15 years. Fecal occult blood test (FOBT) of the stool. You may have this test every year starting at age 34. Flexible sigmoidoscopy or colonoscopy. You may have a sigmoidoscopy every 5 years or a colonoscopy every 10 years starting at age 97. Hepatitis C blood test. Hepatitis B blood test. Sexually transmitted disease (STD) testing. Diabetes screening. This is done by checking your blood sugar (glucose) after you have not eaten for a while (fasting). You may have this done every 1-3 years. Bone density scan. This is done to screen for osteoporosis. You may have this done starting at age 37. Mammogram. This may be done every 1-2 years. Talk to your health care provider about how often you should have regular mammograms. Talk with your health care provider about your test results, treatment options, and if necessary, the need for more tests. Vaccines  Your health care provider may recommend certain vaccines, such as: Influenza vaccine. This is recommended every year. Tetanus, diphtheria, and acellular pertussis (Tdap, Td) vaccine. You may need a Td booster every 10 years. Zoster vaccine. You may need this after age 16. Pneumococcal 13-valent conjugate (PCV13) vaccine. One dose is recommended after age 30. Pneumococcal polysaccharide (PPSV23) vaccine. One dose is recommended after age 67. Talk to your health care provider about which screenings and vaccines you need and  how often you need them. This information is not intended to replace advice given to you by  your health care provider. Make sure you discuss any questions you have with your health care provider. Document Released: 02/13/2015 Document Revised: 10/07/2015 Document Reviewed: 11/18/2014 Elsevier Interactive Patient Education  2017 [employer].  Fall Prevention in the Home Falls can cause injuries. They can happen to people of all ages. There are many things you can do to make your home safe and to help prevent falls. What can I do on the outside of my home? Regularly fix the edges of walkways and driveways and fix any cracks. Remove anything that might make you trip as you walk through a door, such as a raised step or threshold. Trim any bushes or trees on the path to your home. Use bright outdoor lighting. Clear any walking paths of anything that might make someone trip, such as rocks or tools. Regularly check to see if handrails are loose or broken. Make sure that both sides of any steps have handrails. Any raised decks and porches should have guardrails on the edges. Have any leaves, snow, or ice cleared regularly. Use sand or salt on walking paths during winter. Clean up any spills in your garage right away. This includes oil or grease spills. What can I do in the bathroom? Use night lights. Install grab bars by the toilet and in the tub and shower. Do not use towel bars as grab bars. Use non-skid mats or decals in the tub or shower. If you need to sit down in the shower, use a plastic, non-slip stool. Keep the floor dry. Clean up any water that spills on the floor as soon as it happens. Remove soap buildup in the tub or shower regularly. Attach bath mats securely with double-sided non-slip rug tape. Do not have throw rugs and other things on the floor that can make you trip. What can I do in the bedroom? Use night lights. Make sure that you have a light by your bed that is easy to reach. Do not use any sheets or blankets that are too big for your bed. They should not hang  down onto the floor. Have a firm chair that has side arms. You can use this for support while you get dressed. Do not have throw rugs and other things on the floor that can make you trip. What can I do in the kitchen? Clean up any spills right away. Avoid walking on wet floors. Keep items that you use a lot in easy-to-reach places. If you need to reach something above you, use a strong step stool that has a grab bar. Keep electrical cords out of the way. Do not use floor polish or wax that makes floors slippery. If you must use wax, use non-skid floor wax. Do not have throw rugs and other things on the floor that can make you trip. What can I do with my stairs? Do not leave any items on the stairs. Make sure that there are handrails on both sides of the stairs and use them. Fix handrails that are broken or loose. Make sure that handrails are as long as the stairways. Check any carpeting to make sure that it is firmly attached to the stairs. Fix any carpet that is loose or worn. Avoid having throw rugs at the top or bottom of the stairs. If you do have throw rugs, attach them to the floor with carpet tape. Make sure that you have  a light switch at the top of the stairs and the bottom of the stairs. If you do not have them, ask someone to add them for you. What else can I do to help prevent falls? Wear shoes that: Do not have high heels. Have rubber bottoms. Are comfortable and fit you well. Are closed at the toe. Do not wear sandals. If you use a stepladder: Make sure that it is fully opened. Do not climb a closed stepladder. Make sure that both sides of the stepladder are locked into place. Ask someone to hold it for you, if possible. Clearly mark and make sure that you can see: Any grab bars or handrails. First and last steps. Where the edge of each step is. Use tools that help you move around (mobility aids) if they are needed. These  include: Canes. Walkers. Scooters. Crutches. Turn on the lights when you go into a dark area. Replace any light bulbs as soon as they burn out. Set up your furniture so you have a clear path. Avoid moving your furniture around. If any of your floors are uneven, fix them. If there are any pets around you, be aware of where they are. Review your medicines with your doctor. Some medicines can make you feel dizzy. This can increase your chance of falling. Ask your doctor what other things that you can do to help prevent falls. This information is not intended to replace advice given to you by your health care provider. Make sure you discuss any questions you have with your health care provider. Document Released: 11/13/2008 Document Revised: 06/25/2015 Document Reviewed: 02/21/2014 Elsevier Interactive Patient Education  2017 [employer].

## 2022-08-29 ENCOUNTER — Telehealth: Payer: Self-pay | Admitting: Family Medicine

## 2022-08-29 NOTE — Telephone Encounter (Signed)
Placed in Dr.Tabori folder

## 2022-08-29 NOTE — Telephone Encounter (Signed)
Received forms from [facility] Medication Clarification Request Printed and placed in provider bin CR

## 2022-08-30 ENCOUNTER — Encounter: Payer: Self-pay | Admitting: Family Medicine

## 2022-08-30 ENCOUNTER — Encounter: Payer: Self-pay | Admitting: Physical Therapy

## 2022-08-30 ENCOUNTER — Ambulatory Visit: Payer: Medicare Other | Admitting: Physical Therapy

## 2022-08-30 DIAGNOSIS — G8929 Other chronic pain: Secondary | ICD-10-CM

## 2022-08-30 DIAGNOSIS — M25612 Stiffness of left shoulder, not elsewhere classified: Secondary | ICD-10-CM

## 2022-08-30 DIAGNOSIS — M25611 Stiffness of right shoulder, not elsewhere classified: Secondary | ICD-10-CM | POA: Diagnosis not present

## 2022-08-30 DIAGNOSIS — M25512 Pain in left shoulder: Secondary | ICD-10-CM

## 2022-08-30 DIAGNOSIS — R293 Abnormal posture: Secondary | ICD-10-CM | POA: Diagnosis not present

## 2022-08-30 DIAGNOSIS — M6281 Muscle weakness (generalized): Secondary | ICD-10-CM | POA: Diagnosis not present

## 2022-08-30 DIAGNOSIS — R600 Localized edema: Secondary | ICD-10-CM

## 2022-08-30 DIAGNOSIS — M25511 Pain in right shoulder: Secondary | ICD-10-CM | POA: Diagnosis not present

## 2022-08-30 NOTE — Therapy (Signed)
OUTPATIENT PHYSICAL THERAPY SHOULDER TREATMENT   Patient Name: Tonya Johns MRN: [id number] DOB:03/10/1930, 89 y.o., female Today's Date: 08/30/2022  END OF SESSION:  PT End of Session - 08/30/22 1438     Visit Number 3    Number of Visits 12    Date for PT Re-Evaluation 10/14/22    Progress Note Due on Visit 10    PT Start Time 1434    PT Stop Time 1513    PT Time Calculation (min) 39 min    Activity Tolerance Patient tolerated treatment well;No increased pain    Behavior During Therapy WFL for tasks assessed/performed              Past Medical History:  Diagnosis Date   AI (aortic insufficiency)    mild-moderate AI   Anemia    Arthritis    Diverticulitis    GERD (gastroesophageal reflux disease)    Hyperlipidemia    Hypothyroidism    Leg swelling    Osteoporosis    Past Surgical History:  Procedure Laterality Date   EYE SURGERY Bilateral    cataract removal   NO PAST SURGERIES     TOTAL KNEE ARTHROPLASTY Left 01/09/2017   Procedure: TOTAL KNEE ARTHROPLASTY;  Surgeon: Gean Birchwood, MD;  Location: [facility];  Service: Orthopedics;  Laterality: Left;   TOTAL KNEE ARTHROPLASTY Right 11/23/2020   Procedure: RIGHT TOTAL KNEE ARTHROPLASTY;  Surgeon: Gean Birchwood, MD;  Location: [facility];  Service: Orthopedics;  Laterality: Right;   Patient Active Problem List   Diagnosis Date Noted   COVID 02/09/2022   Arthritis of right knee 11/23/2020   Osteoarthritis of right knee 11/19/2020   Incomplete left bundle branch block (LBBB) 10/08/2020   Nonrheumatic aortic valve insufficiency 10/08/2020   Obesity (BMI 30-39.9) 01/07/2019   Abnormal EKG 03/23/2018   Dizziness 03/23/2018   Gait instability 03/23/2018   Bradycardia 03/23/2018   Primary osteoarthritis of left knee 01/09/2017   Degenerative arthritis of left knee 01/05/2017   Elevated blood pressure reading in office without diagnosis of hypertension 03/09/2015   Lumbar back pain 05/22/2014   Uterine prolapse 02/19/2014    Hypothyroidism following radioiodine therapy 07/26/2011   Elevated glucose 04/07/2011   Multinodular goiter 12/10/2010   Knee pain, bilateral 11/25/2010   Depression 11/25/2010   Heart murmur 11/25/2010   Physical exam 07/14/2010   GERD (gastroesophageal reflux disease) 06/15/2010   Hyperlipidemia 06/15/2010    PCP: Sheliah Hatch, MD  REFERRING PROVIDER: Tarry Kos, MD  REFERRING DIAG:  Diagnosis  M25.511,G89.29,M25.512 (ICD-10-CM) - Chronic pain of both shoulders    THERAPY DIAG:  Abnormal posture  Localized edema  Muscle weakness (generalized)  Stiffness of left shoulder, not elsewhere classified  Stiffness of right shoulder, not elsewhere classified  Chronic left shoulder pain  Chronic right shoulder pain  Rationale for Evaluation and Treatment: Rehabilitation  ONSET DATE: Chronic  SUBJECTIVE:  SUBJECTIVE STATEMENT: Pt arriving to therapy today reporting not feel well over the last several days and contributes it to the rain. Pt stating pain in her bil shoulders and low back is 6/10.   Hand dominance: Right  PERTINENT HISTORY: Aortic insufficiency, anemia, HLD, hypothyroid, osteoporosis, Bil cataract surgery, Bil TKAs, obesity  PAIN:  Are you having pain? Yes: NPRS scale: 6/10 Pain location: Left > right shoulder Pain description: Dull ache Aggravating factors: Sleep, reaching and overhead function Relieving factors: Tylenol, heat  PRECAUTIONS: Back  RED FLAGS: None   WEIGHT BEARING RESTRICTIONS: Yes Minimize UE WB  FALLS:  Has patient fallen in last 6 months? No  LIVING ENVIRONMENT: Lives with: lives with their family and lives with their son Lives in: House/apartment Stairs:  Has to use her arms on the handrails Has following equipment at home: Single  point cane  OCCUPATION: Retired  PLOF: Independent with basic ADLs  PATIENT GOALS:Use cane without pain, wash hair without bending over  NEXT MD VISIT: If needed  OBJECTIVE:   DIAGNOSTIC FINDINGS:  Mild degenerative changes appropriate for age.  No acute abnormalities.  Both shoulders.  PATIENT SURVEYS:  EVAL: FOTO 50 (risk adjusted 48, goal 58 in 11 visits)  COGNITION: Overall cognitive status: Within functional limits for tasks assessed     SENSATION: Minimal tingling and paresthesias  POSTURE: Significant for forward head, internally rotated and protracted shoulders and forward flexed trunk  UPPER EXTREMITY ROM:   Passive ROM Left/Right 08/19/2022   Shoulder flexion 125/130   Shoulder extension    Shoulder abduction    Shoulder horizontal adduction 10/25   Shoulder internal rotation 40/50   Shoulder external rotation 55/90   Elbow flexion    Elbow extension    Wrist flexion    Wrist extension    Wrist ulnar deviation    Wrist radial deviation    Wrist pronation    Wrist supination    (Blank rows = not tested)  UPPER EXTREMITY STRENGTH:  In pounds assessed with hand-held dynamometer Left/Right 08/19/2022   Shoulder flexion    Shoulder extension    Shoulder abduction    Shoulder adduction    Shoulder internal rotation 10.6/9.6   Shoulder external rotation 5.8/4.7   Middle trapezius    Lower trapezius    Elbow flexion    Elbow extension    Wrist flexion    Wrist extension    Wrist ulnar deviation    Wrist radial deviation    Wrist pronation    Wrist supination    Grip strength (lbs)    (Blank rows = not tested)  TODAY'S TREATMENT:                                                                                                                                         DATE:  08/30/22:  TherEx: Nustep: level 5 x 6 minutes, UE/LE Standing rows:  Level 3 band 2 x 10  Standing IR/ER level 2 band 2 x 10  Pulleys: flexion and scapution x 3 minutes each   Seated shoulder flexion using 1 # bar  2 x 10  Rolling yellow physioball up and down c bil UE up the wall x 10 Seated IR/ER shoulder isometrics x 10 holding 5 sec    08/25/2022 Supine arm raises 2 sets of 20 x 3 seconds with 2 pounds Shoulder blade pinches 2 sets of 10 x 5 seconds Thera-band ER red 2 sets of 10 times bilaterally with slow eccentrics Thera-band IR red 2 sets of 10 times bilaterally with slow eccentrics Trunk extension AROM 2 sets of 10 for 3 seconds   08/19/2022 Supine arm raises 20 x 3 seconds with 2 pounds Shoulder blade pinches 10 x 5 seconds Thera-band ER red 10 times bilaterally with slow eccentric's Thera-band IR read 10 times bilaterally with slow eccentric's   PATIENT EDUCATION: Education details: Reviewed exam findings and home exercise program Person educated: Patient Education method: Explanation, Demonstration, Tactile cues, Verbal cues, and Handouts Education comprehension: verbalized understanding, returned demonstration, verbal cues required, tactile cues required, and needs further education  HOME EXERCISE PROGRAM: Access Code: [email or web address] URL: [email or web address] Date: 08/30/2022 Prepared by: Narda Amber  Exercises - Standing Scapular Retraction  - 5 x daily - 7 x weekly - 1 sets - 5 reps - 5 second hold - Shoulder External Rotation with Anchored Resistance  - 2 x daily - 7 x weekly - 2 sets - 10 reps - 3 hold - Shoulder Internal Rotation with Resistance  - 2 x daily - 7 x weekly - 1 sets - 10 reps - 3 hold - Supine Scapular Protraction in Flexion with Dumbbells  - 2 x daily - 7 x weekly - 1 sets - 20 reps - 3 seconds hold - Standing Lumbar Extension at Wall - Forearms  - 5 x daily - 7 x weekly - 1 sets - 5 reps - 3 seconds [email or web address]  ASSESSMENT:  CLINICAL IMPRESSION: Pt arriving today reporting bil shoulder pain as well as back pain of 6/10. Pt reporting after Nustep both her back and shoulders felt much better. Pt  tolerating all exercises both for ROM and strengthening. Pt reporting 4/10 pain after she performed all her exercises. Continue skilled PT interventions as pt tolerates.   OBJECTIVE IMPAIRMENTS: decreased activity tolerance, decreased endurance, decreased knowledge of condition, difficulty walking, decreased ROM, decreased strength, decreased safety awareness, increased edema, impaired perceived functional ability, impaired flexibility, impaired UE functional use, improper body mechanics, postural dysfunction, and pain.   ACTIVITY LIMITATIONS: carrying, lifting, dressing, reach over head, and hygiene/grooming  PARTICIPATION LIMITATIONS: meal prep and community activity  PERSONAL FACTORS: Aortic insufficiency, anemia, HLD, hypothyroid, osteoporosis, Bil cataract surgery, Bil TKAs, obesity are also affecting patient's functional outcome.   REHAB POTENTIAL: Good  CLINICAL DECISION MAKING: Stable/uncomplicated  EVALUATION COMPLEXITY: Low   GOALS: Goals reviewed with patient? Yes  SHORT TERM GOALS: Target date: 09/16/2022  Improve bilateral shoulder AROM for flexion to 135 degrees, horizontal adduction to 30 degrees, internal rotation to 50 degrees and external rotation 90 degrees Baseline:  L/R in degrees flexion 125/130; horizontal adduction 10/25; internal rotation 40/50; external rotation 55/90 Goal status: INITIAL  2.  Kristiana will be independent with her day 1 HEP Baseline: Started 08/17/2022 Goal status: On Going 08/30/2022  3.  Improve bilateral shoulder strength is assessed by hand-held dynamometer and compared to evaluation Baseline: L/R in pounds: IR 10.6/9.6  and ER 5.8/4.7 Goal status: INITIAL   LONG TERM GOALS: Target date: 10/14/2022  Improve FOTO to 58 in 11 visits Baseline: 50, risk adjusted 48 Goal status: INITIAL  2.  Improve bilateral shoulder pain to consistently 0-3 out of 10 on the Numeric Pain Rating Scale Baseline: 2-4 out of 10 Goal status: On Going  08/25/2022  3.  Improve bilateral shoulder strength to 15 pounds of internal rotation and 10 pounds of external rotation Baseline: See objective Goal status: INITIAL  4.  Sharian will be able to wash her face and hair without having to drop her head due to improved overhead function Baseline: Has to get in a very flexed posture to do these ADLs currently Goal status: On Going 08/25/2022  5.  Deretha will be independent with her long-term maintenance home exercise program at discharge Baseline: Started 08/19/2022 Goal status: INITIAL   PLAN:  PT FREQUENCY: 1-2x/week  PT DURATION: 8 weeks  PLANNED INTERVENTIONS: Therapeutic exercises, Therapeutic activity, Neuromuscular re-education, Patient/Family education, Self Care, Joint mobilization, Dry Needling, Cryotherapy, Vasopneumatic device, and Manual therapy  PLAN FOR NEXT SESSION:  Emphasis on functional active range of motion and strength with progression into more specific active range of motion and flexibility Activities as her comfort level allows   Sharmon Leyden, PT, MPT 08/30/2022, 2:40 PM

## 2022-08-31 MED ORDER — LEVOTHYROXINE SODIUM 100 MCG PO TABS: 100.0000 ug | ORAL_TABLET | Freq: Every day | ORAL | 1 refills | Status: DC

## 2022-09-01 ENCOUNTER — Other Ambulatory Visit (INDEPENDENT_AMBULATORY_CARE_PROVIDER_SITE_OTHER): Payer: Medicare Other

## 2022-09-01 DIAGNOSIS — R944 Abnormal results of kidney function studies: Secondary | ICD-10-CM | POA: Diagnosis not present

## 2022-09-01 LAB — BASIC METABOLIC PANEL
BUN: 22 mg/dL (ref 6–23)
CO2: 28 mEq/L (ref 19–32)
Calcium: 9.6 mg/dL (ref 8.4–10.5)
Chloride: 102 mEq/L (ref 96–112)
Creatinine, Ser: 1.49 mg/dL — ABNORMAL HIGH (ref 0.40–1.20)
GFR: 30.78 mL/min — ABNORMAL LOW (ref >60.00)
Glucose, Bld: 106 mg/dL — ABNORMAL HIGH (ref 70–99)
Potassium: 4.2 mEq/L (ref 3.5–5.1)
Sodium: 137 mEq/L (ref 135–145)

## 2022-09-02 ENCOUNTER — Encounter: Payer: Self-pay | Admitting: Rehabilitative and Restorative Service Providers"

## 2022-09-02 ENCOUNTER — Ambulatory Visit: Payer: Medicare Other | Admitting: Rehabilitative and Restorative Service Providers"

## 2022-09-02 ENCOUNTER — Other Ambulatory Visit: Payer: Self-pay | Admitting: Family Medicine

## 2022-09-02 ENCOUNTER — Telehealth: Payer: Self-pay

## 2022-09-02 DIAGNOSIS — M6281 Muscle weakness (generalized): Secondary | ICD-10-CM

## 2022-09-02 DIAGNOSIS — R293 Abnormal posture: Secondary | ICD-10-CM

## 2022-09-02 DIAGNOSIS — M25512 Pain in left shoulder: Secondary | ICD-10-CM

## 2022-09-02 DIAGNOSIS — M25611 Stiffness of right shoulder, not elsewhere classified: Secondary | ICD-10-CM

## 2022-09-02 DIAGNOSIS — M25612 Stiffness of left shoulder, not elsewhere classified: Secondary | ICD-10-CM | POA: Diagnosis not present

## 2022-09-02 DIAGNOSIS — R600 Localized edema: Secondary | ICD-10-CM

## 2022-09-02 DIAGNOSIS — M25511 Pain in right shoulder: Secondary | ICD-10-CM

## 2022-09-02 DIAGNOSIS — G8929 Other chronic pain: Secondary | ICD-10-CM

## 2022-09-02 NOTE — Therapy (Signed)
OUTPATIENT PHYSICAL THERAPY SHOULDER TREATMENT   Patient Name: Tonya Johns MRN: [id number] DOB:07-30-1930, 89 y.o., female Today's Date: 09/02/2022  END OF SESSION:  PT End of Session - 09/02/22 1149     Visit Number 4    Number of Visits 12    Date for PT Re-Evaluation 10/14/22    Progress Note Due on Visit 10    PT Start Time 1147    PT Stop Time 1227    PT Time Calculation (min) 40 min    Activity Tolerance Patient tolerated treatment well;No increased pain    Behavior During Therapy WFL for tasks assessed/performed               Past Medical History:  Diagnosis Date   AI (aortic insufficiency)    mild-moderate AI   Anemia    Arthritis    Diverticulitis    GERD (gastroesophageal reflux disease)    Hyperlipidemia    Hypothyroidism    Leg swelling    Osteoporosis    Past Surgical History:  Procedure Laterality Date   EYE SURGERY Bilateral    cataract removal   NO PAST SURGERIES     TOTAL KNEE ARTHROPLASTY Left 01/09/2017   Procedure: TOTAL KNEE ARTHROPLASTY;  Surgeon: Gean Birchwood, MD;  Location: [facility];  Service: Orthopedics;  Laterality: Left;   TOTAL KNEE ARTHROPLASTY Right 11/23/2020   Procedure: RIGHT TOTAL KNEE ARTHROPLASTY;  Surgeon: Gean Birchwood, MD;  Location: [facility];  Service: Orthopedics;  Laterality: Right;   Patient Active Problem List   Diagnosis Date Noted   COVID 02/09/2022   Arthritis of right knee 11/23/2020   Osteoarthritis of right knee 11/19/2020   Incomplete left bundle branch block (LBBB) 10/08/2020   Nonrheumatic aortic valve insufficiency 10/08/2020   Obesity (BMI 30-39.9) 01/07/2019   Abnormal EKG 03/23/2018   Dizziness 03/23/2018   Gait instability 03/23/2018   Bradycardia 03/23/2018   Primary osteoarthritis of left knee 01/09/2017   Degenerative arthritis of left knee 01/05/2017   Elevated blood pressure reading in office without diagnosis of hypertension 03/09/2015   Lumbar back pain 05/22/2014   Uterine prolapse 02/19/2014    Hypothyroidism following radioiodine therapy 07/26/2011   Elevated glucose 04/07/2011   Multinodular goiter 12/10/2010   Knee pain, bilateral 11/25/2010   Depression 11/25/2010   Heart murmur 11/25/2010   Physical exam 07/14/2010   GERD (gastroesophageal reflux disease) 06/15/2010   Hyperlipidemia 06/15/2010    PCP: Sheliah Hatch, MD  REFERRING PROVIDER: Tarry Kos, MD  REFERRING DIAG:  Diagnosis  M25.511,G89.29,M25.512 (ICD-10-CM) - Chronic pain of both shoulders    THERAPY DIAG:  Abnormal posture  Localized edema  Muscle weakness (generalized)  Stiffness of left shoulder, not elsewhere classified  Stiffness of right shoulder, not elsewhere classified  Chronic left shoulder pain  Chronic right shoulder pain  Rationale for Evaluation and Treatment: Rehabilitation  ONSET DATE: Chronic  SUBJECTIVE:  SUBJECTIVE STATEMENT: Aunisty reports and demonstrates continued excellent HEP compliance.  She notes some arthritis pain with recent rain.  Hand dominance: Right  PERTINENT HISTORY: Aortic insufficiency, anemia, HLD, hypothyroid, osteoporosis, Bil cataract surgery, Bil TKAs, obesity  PAIN:  Are you having pain? Yes: NPRS scale: 0-5/10 low back and 4-5/10 shoulders on a 10/10 Pain location: Left > right shoulder Pain description: Dull ache Aggravating factors: Sleep, reaching and overhead function Relieving factors: Tylenol, heat  PRECAUTIONS: Back  RED FLAGS: None   WEIGHT BEARING RESTRICTIONS: Yes Minimize UE WB  FALLS:  Has patient fallen in last 6 months? No  LIVING ENVIRONMENT: Lives with: lives with their family and lives with their son Lives in: House/apartment Stairs:  Has to use her arms on the handrails Has following equipment at home: Single point  cane  OCCUPATION: Retired  PLOF: Independent with basic ADLs  PATIENT GOALS:Use cane without pain, wash hair without bending over  NEXT MD VISIT: If needed  OBJECTIVE:   DIAGNOSTIC FINDINGS:  Mild degenerative changes appropriate for age.  No acute abnormalities.  Both shoulders.  PATIENT SURVEYS:  EVAL: FOTO 50 (risk adjusted 48, goal 58 in 11 visits)  COGNITION: Overall cognitive status: Within functional limits for tasks assessed     SENSATION: Minimal tingling and paresthesias  POSTURE: Significant for forward head, internally rotated and protracted shoulders and forward flexed trunk  UPPER EXTREMITY ROM:   Passive ROM Left/Right 08/19/2022   Shoulder flexion 125/130   Shoulder extension    Shoulder abduction    Shoulder horizontal adduction 10/25   Shoulder internal rotation 40/50   Shoulder external rotation 55/90   Elbow flexion    Elbow extension    Wrist flexion    Wrist extension    Wrist ulnar deviation    Wrist radial deviation    Wrist pronation    Wrist supination    (Blank rows = not tested)  UPPER EXTREMITY STRENGTH:  In pounds assessed with hand-held dynamometer Left/Right 08/19/2022   Shoulder flexion    Shoulder extension    Shoulder abduction    Shoulder adduction    Shoulder internal rotation 10.6/9.6   Shoulder external rotation 5.8/4.7   Middle trapezius    Lower trapezius    Elbow flexion    Elbow extension    Wrist flexion    Wrist extension    Wrist ulnar deviation    Wrist radial deviation    Wrist pronation    Wrist supination    Grip strength (lbs)    (Blank rows = not tested)  TODAY'S TREATMENT:                                                                                                                                         DATE:  09/02/2022 Supine arm raises 2 sets of 20 x 3 seconds with 3 pounds Shoulder blade pinches 10 x 5  seconds Thera-band ER red 2 sets of 10 times bilaterally with slow  eccentrics Thera-band IR red 10 times bilaterally with slow eccentrics Trunk extension AROM 2 sets of 10 for 3 seconds Heel to toe raises 2 sets of 10 for 3 seconds Hip hike hands on counter top 10 x for 3 seconds NuStep Level 5, Hands set at 5 for 5 minutes   08/30/22:  TherEx: Nustep: level 5 x 6 minutes, UE/LE Standing rows: Level 3 band 2 x 10  Standing IR/ER level 2 band 2 x 10  Pulleys: flexion and scapution x 3 minutes each  Seated shoulder flexion using 1 # bar  2 x 10  Rolling yellow physioball up and down c bil UE up the wall x 10 Seated IR/ER shoulder isometrics x 10 holding 5 sec   08/25/2022 Supine arm raises 2 sets of 20 x 3 seconds with 2 pounds Shoulder blade pinches 2 sets of 10 x 5 seconds Thera-band ER red 2 sets of 10 times bilaterally with slow eccentrics Thera-band IR red 2 sets of 10 times bilaterally with slow eccentrics Trunk extension AROM 2 sets of 10 for 3 seconds   PATIENT EDUCATION: Education details: Reviewed exam findings and home exercise program Person educated: Patient Education method: Explanation, Demonstration, Tactile cues, Verbal cues, and Handouts Education comprehension: verbalized understanding, returned demonstration, verbal cues required, tactile cues required, and needs further education  HOME EXERCISE PROGRAM: Access Code: [email or web address] URL: [email or web address] Date: 08/30/2022 Prepared by: Narda Amber  Exercises - Standing Scapular Retraction  - 5 x daily - 7 x weekly - 1 sets - 5 reps - 5 second hold - Shoulder External Rotation with Anchored Resistance  - 2 x daily - 7 x weekly - 2 sets - 10 reps - 3 hold - Shoulder Internal Rotation with Resistance  - 2 x daily - 7 x weekly - 1 sets - 10 reps - 3 hold - Supine Scapular Protraction in Flexion with Dumbbells  - 2 x daily - 7 x weekly - 1 sets - 20 reps - 3 seconds hold - Standing Lumbar Extension at Wall - Forearms  - 5 x daily - 7 x weekly - 1 sets - 5 reps - 3  seconds [email or web address]  ASSESSMENT:  CLINICAL IMPRESSION: Kelliann has very weak scapular and rotator cuff strength which is severely impacting her bilateral upper extremity function.  Her current program is addressing this and appropriate progressions will be necessary to meet long-term goals.  Overall, her back is doing better than her shoulders, although we did progress some core strength activities today as intermittent back pain does limit her function.  Continue appropriate scapular, rotator cuff, low back and quadriceps strength progressions to address impairments noted at evaluation and to meet long-term goals.  OBJECTIVE IMPAIRMENTS: decreased activity tolerance, decreased endurance, decreased knowledge of condition, difficulty walking, decreased ROM, decreased strength, decreased safety awareness, increased edema, impaired perceived functional ability, impaired flexibility, impaired UE functional use, improper body mechanics, postural dysfunction, and pain.   ACTIVITY LIMITATIONS: carrying, lifting, dressing, reach over head, and hygiene/grooming  PARTICIPATION LIMITATIONS: meal prep and community activity  PERSONAL FACTORS: Aortic insufficiency, anemia, HLD, hypothyroid, osteoporosis, Bil cataract surgery, Bil TKAs, obesity are also affecting patient's functional outcome.   REHAB POTENTIAL: Good  CLINICAL DECISION MAKING: Stable/uncomplicated  EVALUATION COMPLEXITY: Low   GOALS: Goals reviewed with patient? Yes  SHORT TERM GOALS: Target date: 09/16/2022  Improve bilateral shoulder AROM for flexion to 135 degrees, horizontal adduction to 30 degrees,  internal rotation to 50 degrees and external rotation 90 degrees Baseline:  L/R in degrees flexion 125/130; horizontal adduction 10/25; internal rotation 40/50; external rotation 55/90 Goal status: On Going 09/02/2022  2.  Tianni will be independent with her day 1 HEP Baseline: Started 08/17/2022 Goal status: Met 09/02/2022  3.  Improve  bilateral shoulder strength is assessed by hand-held dynamometer and compared to evaluation Baseline: L/R in pounds: IR 10.6/9.6 and ER 5.8/4.7 Goal status: On Going 09/02/2022   LONG TERM GOALS: Target date: 10/14/2022  Improve FOTO to 58 in 11 visits Baseline: 50, risk adjusted 48 Goal status: INITIAL  2.  Improve bilateral shoulder pain to consistently 0-3 out of 10 on the Numeric Pain Rating Scale Baseline: 2-4 out of 10 Goal status: On Going 09/02/2022  3.  Improve bilateral shoulder strength to 15 pounds of internal rotation and 10 pounds of external rotation Baseline: See objective Goal status: INITIAL  4.  Tiffini will be able to wash her face and hair without having to drop her head due to improved overhead function Baseline: Has to get in a very flexed posture to do these ADLs currently Goal status: On Going 09/02/2022  5.  Rehanna will be independent with her long-term maintenance home exercise program at discharge Baseline: Started 08/19/2022 Goal status: INITIAL   PLAN:  PT FREQUENCY: 1-2x/week  PT DURATION: 8 weeks  PLANNED INTERVENTIONS: Therapeutic exercises, Therapeutic activity, Neuromuscular re-education, Patient/Family education, Self Care, Joint mobilization, Dry Needling, Cryotherapy, Vasopneumatic device, and Manual therapy  PLAN FOR NEXT SESSION:  Emphasis on functional shoulder active range of motion and strength with progression into more specific shoulder active range of motion and flexibility Activities as her comfort level allows.  Reviewed current shoulder and low back strengthening program with education as needed.  Rob plans on doing a progress note the week of August 5.   Cherlyn Cushing, PT, MPT 09/02/2022, 4:00 PM

## 2022-09-02 NOTE — Telephone Encounter (Signed)
Forms faxed and placed in scan  

## 2022-09-02 NOTE — Telephone Encounter (Signed)
Form completed and returned to [facility] 

## 2022-09-02 NOTE — Telephone Encounter (Signed)
Pt seen results via my chart  

## 2022-09-02 NOTE — Telephone Encounter (Signed)
-----   Message from Neena Rhymes sent at 09/01/2022  4:28 PM EDT ----- GFR and Creatinine (both markers of kidney function) have both improved.  This is great news!

## 2022-09-07 ENCOUNTER — Encounter: Payer: Self-pay | Admitting: Rehabilitative and Restorative Service Providers"

## 2022-09-07 ENCOUNTER — Ambulatory Visit: Payer: Medicare Other | Admitting: Rehabilitative and Restorative Service Providers"

## 2022-09-07 DIAGNOSIS — M6281 Muscle weakness (generalized): Secondary | ICD-10-CM

## 2022-09-07 DIAGNOSIS — M25612 Stiffness of left shoulder, not elsewhere classified: Secondary | ICD-10-CM | POA: Diagnosis not present

## 2022-09-07 DIAGNOSIS — M25611 Stiffness of right shoulder, not elsewhere classified: Secondary | ICD-10-CM | POA: Diagnosis not present

## 2022-09-07 DIAGNOSIS — R600 Localized edema: Secondary | ICD-10-CM | POA: Diagnosis not present

## 2022-09-07 DIAGNOSIS — G8929 Other chronic pain: Secondary | ICD-10-CM | POA: Diagnosis not present

## 2022-09-07 DIAGNOSIS — M25511 Pain in right shoulder: Secondary | ICD-10-CM | POA: Diagnosis not present

## 2022-09-07 DIAGNOSIS — M25512 Pain in left shoulder: Secondary | ICD-10-CM

## 2022-09-07 DIAGNOSIS — R293 Abnormal posture: Secondary | ICD-10-CM | POA: Diagnosis not present

## 2022-09-07 NOTE — Therapy (Signed)
OUTPATIENT PHYSICAL THERAPY TREATMENT   Patient Name: Tonya Johns MRN: [id number] DOB:[birth year], 89 y.o., female Today's Date: 09/07/2022  END OF SESSION:  PT End of Session - 09/07/22 1128     Visit Number 5    Number of Visits 12    Date for PT Re-Evaluation 10/14/22    Progress Note Due on Visit 10    PT Start Time 1127    PT Stop Time 1206    PT Time Calculation (min) 39 min    Activity Tolerance Patient limited by fatigue    Behavior During Therapy WFL for tasks assessed/performed                Past Medical History:  Diagnosis Date   AI (aortic insufficiency)    mild-moderate AI   Anemia    Arthritis    Diverticulitis    GERD (gastroesophageal reflux disease)    Hyperlipidemia    Hypothyroidism    Leg swelling    Osteoporosis    Past Surgical History:  Procedure Laterality Date   EYE SURGERY Bilateral    cataract removal   NO PAST SURGERIES     TOTAL KNEE ARTHROPLASTY Left 01/09/2017   Procedure: TOTAL KNEE ARTHROPLASTY;  Surgeon: Gean Birchwood, MD;  Location: [facility];  Service: Orthopedics;  Laterality: Left;   TOTAL KNEE ARTHROPLASTY Right 11/23/2020   Procedure: RIGHT TOTAL KNEE ARTHROPLASTY;  Surgeon: Gean Birchwood, MD;  Location: [facility];  Service: Orthopedics;  Laterality: Right;   Patient Active Problem List   Diagnosis Date Noted   COVID 02/09/2022   Arthritis of right knee 11/23/2020   Osteoarthritis of right knee 11/19/2020   Incomplete left bundle branch block (LBBB) 10/08/2020   Nonrheumatic aortic valve insufficiency 10/08/2020   Obesity (BMI 30-39.9) 01/07/2019   Abnormal EKG 03/23/2018   Dizziness 03/23/2018   Gait instability 03/23/2018   Bradycardia 03/23/2018   Primary osteoarthritis of left knee 01/09/2017   Degenerative arthritis of left knee 01/05/2017   Elevated blood pressure reading in office without diagnosis of hypertension 03/09/2015   Lumbar back pain 05/22/2014   Uterine prolapse 02/19/2014   Hypothyroidism following  radioiodine therapy 07/26/2011   Elevated glucose 04/07/2011   Multinodular goiter 12/10/2010   Knee pain, bilateral 11/25/2010   Depression 11/25/2010   Heart murmur 11/25/2010   Physical exam 07/14/2010   GERD (gastroesophageal reflux disease) 06/15/2010   Hyperlipidemia 06/15/2010    PCP: Sheliah Hatch, MD  REFERRING PROVIDER: Tarry Kos, MD  REFERRING DIAG:  Diagnosis  M25.511,G89.29,M25.512 (ICD-10-CM) - Chronic pain of both shoulders    THERAPY DIAG:  Abnormal posture  Localized edema  Muscle weakness (generalized)  Stiffness of left shoulder, not elsewhere classified  Stiffness of right shoulder, not elsewhere classified  Chronic left shoulder pain  Chronic right shoulder pain  Rationale for Evaluation and Treatment: Rehabilitation  ONSET DATE: Chronic  SUBJECTIVE:  SUBJECTIVE STATEMENT: Pt reported 5/10 pain in Lt shoulder , more than Rt upon arrival.  Overall has felt like she has improved some.  Reported today was maybe more pain due to weather.   Hand dominance: Right  PERTINENT HISTORY: Aortic insufficiency, anemia, HLD, hypothyroid, osteoporosis, Bil cataract surgery, Bil TKAs, obesity  PAIN:  NPRS scale: 5/10 Lt shoulder upon arrival.  Pain location: Left > right shoulder Pain description: Dull ache Aggravating factors: Sleep, reaching and overhead function Relieving factors: Tylenol, heat  PRECAUTIONS: Back  RED FLAGS: None   WEIGHT BEARING RESTRICTIONS: Yes Minimize UE WB  FALLS:  Has patient fallen in last 6 months? No  LIVING ENVIRONMENT: Lives with: lives with their family and lives with their son Lives in: House/apartment Stairs:  Has to use her arms on the handrails Has following equipment at home: Single point  cane  OCCUPATION: Retired  PLOF: Independent with basic ADLs  PATIENT GOALS:Use cane without pain, wash hair without bending over  NEXT MD VISIT: If needed  OBJECTIVE:   DIAGNOSTIC FINDINGS:  Mild degenerative changes appropriate for age.  No acute abnormalities.  Both shoulders.  PATIENT SURVEYS:  EVAL: FOTO 50 (risk adjusted 48, goal 58 in 11 visits)  COGNITION: Overall cognitive status: Within functional limits for tasks assessed     SENSATION: Minimal tingling and paresthesias  POSTURE: Significant for forward head, internally rotated and protracted shoulders and forward flexed trunk  UPPER EXTREMITY ROM:   Passive ROM Left/Right 08/19/2022   Shoulder flexion 125/130   Shoulder extension    Shoulder abduction    Shoulder horizontal adduction 10/25   Shoulder internal rotation 40/50   Shoulder external rotation 55/90   Elbow flexion    Elbow extension    Wrist flexion    Wrist extension    Wrist ulnar deviation    Wrist radial deviation    Wrist pronation    Wrist supination    (Blank rows = not tested)  UPPER EXTREMITY STRENGTH:   Left/Right 08/19/2022 in pounds assessed with hand-held dynamometer Right 09/07/2022 Left 09/07/2022  Shoulder flexion  4+/5 4+/5  Shoulder extension     Shoulder abduction  4/5 4/5  Shoulder adduction     Shoulder internal rotation 10.6/9.6 5/5 5/5  Shoulder external rotation 5.8/4.7 4+/5 4+/5  Middle trapezius     Lower trapezius     Elbow flexion     Elbow extension     Wrist flexion     Wrist extension     Wrist ulnar deviation     Wrist radial deviation     Wrist pronation     Wrist supination     Grip strength (lbs)     (Blank rows = not tested)                  TODAY'S TREATMENT:                                                                 DATE: 09/07/2022 Therex: Nustep lvl 5 8 mins UE/LE Standing green band rows 2 x 15 bilaterally  Standing green band GH ext 2 x 15 bilaterally Standing green band ER 2 x  10 c towel under arm , performed bilaterally  Seated bilateral shoulder flexion AAROM with  1 lb bar x 15 Seated alternating PF/DF x 20 (performed sitting due to general fatigue)   TODAY'S TREATMENT:                                                                 DATE: 09/02/2022  Supine arm raises 2 sets of 20 x 3 seconds with 3 pounds Shoulder blade pinches 10 x 5 seconds Thera-band ER red 2 sets of 10 times bilaterally with slow eccentrics Thera-band IR red 10 times bilaterally with slow eccentrics Trunk extension AROM 2 sets of 10 for 3 seconds Heel to toe raises 2 sets of 10 for 3 seconds Hip hike hands on counter top 10 x for 3 seconds NuStep Level 5, Hands set at 5 for 5 minutes   TODAY'S TREATMENT:                                                                 DATE:08/30/22:  TherEx: Nustep: level 5 x 6 minutes, UE/LE Standing rows: Level 3 band 2 x 10  Standing IR/ER level 2 band 2 x 10  Pulleys: flexion and scapution x 3 minutes each  Seated shoulder flexion using 1 # bar  2 x 10  Rolling yellow physioball up and down c bil UE up the wall x 10 Seated IR/ER shoulder isometrics x 10 holding 5 sec   TODAY'S TREATMENT:                                                                 DATE:08/25/2022 Supine arm raises 2 sets of 20 x 3 seconds with 2 pounds Shoulder blade pinches 2 sets of 10 x 5 seconds Thera-band ER red 2 sets of 10 times bilaterally with slow eccentrics Thera-band IR red 2 sets of 10 times bilaterally with slow eccentrics Trunk extension AROM 2 sets of 10 for 3 seconds   PATIENT EDUCATION: Education details: Reviewed exam findings and home exercise program Person educated: Patient Education method: Explanation, Demonstration, Tactile cues, Verbal cues, and Handouts Education comprehension: verbalized understanding, returned demonstration, verbal cues required, tactile cues required, and needs further education  HOME EXERCISE PROGRAM: Access Code:  [email or web address] URL: [email or web address] Date: 08/30/2022 Prepared by: Narda Amber  Exercises - Standing Scapular Retraction  - 5 x daily - 7 x weekly - 1 sets - 5 reps - 5 second hold - Shoulder External Rotation with Anchored Resistance  - 2 x daily - 7 x weekly - 2 sets - 10 reps - 3 hold - Shoulder Internal Rotation with Resistance  - 2 x daily - 7 x weekly - 1 sets - 10 reps - 3 hold - Supine Scapular Protraction in Flexion with Dumbbells  - 2 x daily - 7 x weekly - 1 sets - 20 reps - 3 seconds hold - Standing Lumbar Extension at  Wall - Forearms  - 5 x daily - 7 x weekly - 1 sets - 5 reps - 3 seconds [email or web address]  ASSESSMENT:  CLINICAL IMPRESSION: Fatigue was noted in shoulder strengthening activity but overall doing better with them.  MMT strength assessment showed relatively good check today.  Pt may continue to benefit from progressive strengthening for shoulder and scapular function.   OBJECTIVE IMPAIRMENTS: decreased activity tolerance, decreased endurance, decreased knowledge of condition, difficulty walking, decreased ROM, decreased strength, decreased safety awareness, increased edema, impaired perceived functional ability, impaired flexibility, impaired UE functional use, improper body mechanics, postural dysfunction, and pain.   ACTIVITY LIMITATIONS: carrying, lifting, dressing, reach over head, and hygiene/grooming  PARTICIPATION LIMITATIONS: meal prep and community activity  PERSONAL FACTORS: Aortic insufficiency, anemia, HLD, hypothyroid, osteoporosis, Bil cataract surgery, Bil TKAs, obesity are also affecting patient's functional outcome.   REHAB POTENTIAL: Good  CLINICAL DECISION MAKING: Stable/uncomplicated  EVALUATION COMPLEXITY: Low   GOALS: Goals reviewed with patient? Yes  SHORT TERM GOALS: Target date: 09/16/2022  Improve bilateral shoulder AROM for flexion to 135 degrees, horizontal adduction to 30 degrees, internal rotation to 50 degrees  and external rotation 90 degrees Baseline:  L/R in degrees flexion 125/130; horizontal adduction 10/25; internal rotation 40/50; external rotation 55/90 Goal status: On Going 09/02/2022  2.  Francee will be independent with her day 1 HEP Baseline: Started 08/17/2022 Goal status: Met 09/02/2022  3.  Improve bilateral shoulder strength is assessed by hand-held dynamometer and compared to evaluation Baseline: L/R in pounds: IR 10.6/9.6 and ER 5.8/4.7 Goal status: On Going 09/02/2022   LONG TERM GOALS: Target date: 10/14/2022  Improve FOTO to 58 in 11 visits Baseline: 50, risk adjusted 48 Goal status: INITIAL  2.  Improve bilateral shoulder pain to consistently 0-3 out of 10 on the Numeric Pain Rating Scale Baseline: 2-4 out of 10 Goal status: On Going 09/02/2022  3.  Improve bilateral shoulder strength to 15 pounds of internal rotation and 10 pounds of external rotation Baseline: See objective Goal status: INITIAL  4.  Shyonna will be able to wash her face and hair without having to drop her head due to improved overhead function Baseline: Has to get in a very flexed posture to do these ADLs currently Goal status: On Going 09/02/2022  5.  Merline will be independent with her long-term maintenance home exercise program at discharge Baseline: Started 08/19/2022 Goal status: INITIAL   PLAN:  PT FREQUENCY: 1-2x/week  PT DURATION: 8 weeks  PLANNED INTERVENTIONS: Therapeutic exercises, Therapeutic activity, Neuromuscular re-education, Patient/Family education, Self Care, Joint mobilization, Dry Needling, Cryotherapy, Vasopneumatic device, and Manual therapy  PLAN FOR NEXT SESSION:    Progressive strengthening as tolerated.  General mobility/activity tolerance improvements.    Chyrel Masson, PT, DPT, OCS, ATC 09/07/22  12:08 PM

## 2022-09-09 ENCOUNTER — Encounter: Payer: Self-pay | Admitting: Rehabilitative and Restorative Service Providers"

## 2022-09-09 ENCOUNTER — Ambulatory Visit: Payer: Medicare Other | Admitting: Rehabilitative and Restorative Service Providers"

## 2022-09-09 DIAGNOSIS — M25611 Stiffness of right shoulder, not elsewhere classified: Secondary | ICD-10-CM

## 2022-09-09 DIAGNOSIS — G8929 Other chronic pain: Secondary | ICD-10-CM | POA: Diagnosis not present

## 2022-09-09 DIAGNOSIS — M25612 Stiffness of left shoulder, not elsewhere classified: Secondary | ICD-10-CM

## 2022-09-09 DIAGNOSIS — M25511 Pain in right shoulder: Secondary | ICD-10-CM | POA: Diagnosis not present

## 2022-09-09 DIAGNOSIS — M25512 Pain in left shoulder: Secondary | ICD-10-CM

## 2022-09-09 DIAGNOSIS — R600 Localized edema: Secondary | ICD-10-CM

## 2022-09-09 DIAGNOSIS — M6281 Muscle weakness (generalized): Secondary | ICD-10-CM

## 2022-09-09 DIAGNOSIS — R293 Abnormal posture: Secondary | ICD-10-CM

## 2022-09-09 NOTE — Therapy (Signed)
OUTPATIENT PHYSICAL THERAPY TREATMENT/PROGRESS NOTE   Patient Name: Jahniya Redstone MRN: [id number] DOB:1930/04/05, 89 y.o., female Today's Date: 09/09/2022  END OF SESSION:  PT End of Session - 09/09/22 1140     Visit Number 6    Number of Visits 12    Date for PT Re-Evaluation 10/14/22    Progress Note Due on Visit 10    PT Start Time 1140    PT Stop Time 1226    PT Time Calculation (min) 46 min    Activity Tolerance Patient tolerated treatment well;No increased pain    Behavior During Therapy [facility] for tasks assessed/performed            Progress Note Reporting Period 08/19/2022 to 09/09/2022  See note below for Objective Data and Assessment of Progress/Goals.     Past Medical History:  Diagnosis Date   AI (aortic insufficiency)    mild-moderate AI   Anemia    Arthritis    Diverticulitis    GERD (gastroesophageal reflux disease)    Hyperlipidemia    Hypothyroidism    Leg swelling    Osteoporosis    Past Surgical History:  Procedure Laterality Date   EYE SURGERY Bilateral    cataract removal   NO PAST SURGERIES     TOTAL KNEE ARTHROPLASTY Left 01/09/2017   Procedure: TOTAL KNEE ARTHROPLASTY;  Surgeon: Gean Birchwood, MD;  Location: [facility];  Service: Orthopedics;  Laterality: Left;   TOTAL KNEE ARTHROPLASTY Right 11/23/2020   Procedure: RIGHT TOTAL KNEE ARTHROPLASTY;  Surgeon: Gean Birchwood, MD;  Location: [facility];  Service: Orthopedics;  Laterality: Right;   Patient Active Problem List   Diagnosis Date Noted   COVID 02/09/2022   Arthritis of right knee 11/23/2020   Osteoarthritis of right knee 11/19/2020   Incomplete left bundle branch block (LBBB) 10/08/2020   Nonrheumatic aortic valve insufficiency 10/08/2020   Obesity (BMI 30-39.9) 01/07/2019   Abnormal EKG 03/23/2018   Dizziness 03/23/2018   Gait instability 03/23/2018   Bradycardia 03/23/2018   Primary osteoarthritis of left knee 01/09/2017   Degenerative arthritis of left knee 01/05/2017   Elevated blood  pressure reading in office without diagnosis of hypertension 03/09/2015   Lumbar back pain 05/22/2014   Uterine prolapse 02/19/2014   Hypothyroidism following radioiodine therapy 07/26/2011   Elevated glucose 04/07/2011   Multinodular goiter 12/10/2010   Knee pain, bilateral 11/25/2010   Depression 11/25/2010   Heart murmur 11/25/2010   Physical exam 07/14/2010   GERD (gastroesophageal reflux disease) 06/15/2010   Hyperlipidemia 06/15/2010    PCP: Sheliah Hatch, MD  REFERRING PROVIDER: Tarry Kos, MD  REFERRING DIAG:  Diagnosis  M25.511,G89.29,M25.512 (ICD-10-CM) - Chronic pain of both shoulders    THERAPY DIAG:  Abnormal posture  Localized edema  Muscle weakness (generalized)  Stiffness of left shoulder, not elsewhere classified  Stiffness of right shoulder, not elsewhere classified  Chronic right shoulder pain  Chronic left shoulder pain  Rationale for Evaluation and Treatment: Rehabilitation  ONSET DATE: Chronic  SUBJECTIVE:  SUBJECTIVE STATEMENT: Tenille notes she is sleeping better and she has less shoulder pain since starting PT.  Hand dominance: Right  PERTINENT HISTORY: Aortic insufficiency, anemia, HLD, hypothyroid, osteoporosis, Bil cataract surgery, Bil TKAs, obesity  PAIN:  NPRS scale: Shoulders 0-5/10 bilateral shoulders, Low back 0-5/10 Pain location: Left > right shoulder Pain description: Dull ache Aggravating factors: Sleep, reaching and overhead function Relieving factors: Tylenol, heat  PRECAUTIONS: Back  RED FLAGS: None   WEIGHT BEARING RESTRICTIONS: Yes Minimize UE WB  FALLS:  Has patient fallen in last 6 months? No  LIVING ENVIRONMENT: Lives with: lives with their family and lives with their son Lives in: House/apartment Stairs:  Has to use  her arms on the handrails Has following equipment at home: Single point cane  OCCUPATION: Retired  PLOF: Independent with basic ADLs  PATIENT GOALS:Use cane without pain, wash hair without bending over  NEXT MD VISIT: If needed  OBJECTIVE:   DIAGNOSTIC FINDINGS:  Mild degenerative changes appropriate for age.  No acute abnormalities.  Both shoulders.  PATIENT SURVEYS:  09/09/2022 FOTO 72 (Goal met)  EVAL: FOTO 50 (risk adjusted 48, goal 58 in 11 visits)  COGNITION: Overall cognitive status: Within functional limits for tasks assessed     SENSATION: Minimal tingling and paresthesias  POSTURE: Significant for forward head, internally rotated and protracted shoulders and forward flexed trunk  UPPER EXTREMITY ROM:   Passive ROM Left/Right 08/19/2022 Left/Right 09/09/2022  Shoulder flexion 125/130 130/145  Shoulder extension    Shoulder abduction    Shoulder horizontal adduction 10/25 40/35  Shoulder internal rotation 40/50 60/50  Shoulder external rotation 55/90 75/90  Elbow flexion    Elbow extension    Wrist flexion    Wrist extension    Wrist ulnar deviation    Wrist radial deviation    Wrist pronation    Wrist supination    (Blank rows = not tested)  UPPER EXTREMITY STRENGTH:   Left/Right 08/19/2022 in pounds assessed with hand-held dynamometer Right 09/07/2022 Left 09/07/2022 Left/Right 09/09/2022 in pounds assessed with hand-held dynamometer  Shoulder flexion  4+/5 4+/5   Shoulder extension      Shoulder abduction  4/5 4/5   Shoulder adduction      Shoulder internal rotation 10.6/9.6 5/5 5/5 17.4/14.6  Shoulder external rotation 5.8/4.7 4+/5 4+/5 16.7/10.9  Middle trapezius      Lower trapezius      Elbow flexion      Elbow extension      Wrist flexion      Wrist extension      Wrist ulnar deviation      Wrist radial deviation      Wrist pronation      Wrist supination      Grip strength (lbs)      (Blank rows = not tested)                  TODAY'S  TREATMENT:                                                                 DATE:  09/09/2022 Supine arm raises 2 sets of 20 x 3 seconds with 3 pounds Shoulder blade pinches 10 x 5 seconds Thera-band ER red 2 sets of 10 times bilaterally with  slow eccentrics Thera-band IR red 10 times bilaterally with slow eccentrics Trunk extension AROM 2 sets of 10 for 3 seconds Heel to toe raises 2 sets of 10 for 3 seconds Hip hike hands on counter top 10 x for 3 seconds Progress Note   09/07/2022 Therex: Nustep lvl 5 8 mins UE/LE Standing green band rows 2 x 15 bilaterally  Standing green band GH ext 2 x 15 bilaterally Standing green band ER 2 x 10 c towel under arm , performed bilaterally  Seated bilateral shoulder flexion AAROM with 1 lb bar x 15 Seated alternating PF/DF x 20 (performed sitting due to general fatigue)   09/02/2022 Supine arm raises 2 sets of 20 x 3 seconds with 3 pounds Shoulder blade pinches 10 x 5 seconds Thera-band ER red 2 sets of 10 times bilaterally with slow eccentrics Thera-band IR red 10 times bilaterally with slow eccentrics Trunk extension AROM 2 sets of 10 for 3 seconds Heel to toe raises 2 sets of 10 for 3 seconds Hip hike hands on counter top 10 x for 3 seconds NuStep Level 5, Hands set at 5 for 5 minutes    PATIENT EDUCATION: Education details: Reviewed exam findings and home exercise program Person educated: Patient Education method: Explanation, Demonstration, Tactile cues, Verbal cues, and Handouts Education comprehension: verbalized understanding, returned demonstration, verbal cues required, tactile cues required, and needs further education  HOME EXERCISE PROGRAM: Access Code: [email or web address] URL: [email or web address] Date: 08/30/2022 Prepared by: Narda Amber  Exercises - Standing Scapular Retraction  - 5 x daily - 7 x weekly - 1 sets - 5 reps - 5 second hold - Shoulder External Rotation with Anchored Resistance  - 2 x daily - 7 x weekly  - 2 sets - 10 reps - 3 hold - Shoulder Internal Rotation with Resistance  - 2 x daily - 7 x weekly - 1 sets - 10 reps - 3 hold - Supine Scapular Protraction in Flexion with Dumbbells  - 2 x daily - 7 x weekly - 1 sets - 20 reps - 3 seconds hold - Standing Lumbar Extension at Wall - Forearms  - 5 x daily - 7 x weekly - 1 sets - 5 reps - 3 seconds [email or web address]  ASSESSMENT:  CLINICAL IMPRESSION: Khristian notes significant progress in her pain and function since starting supervised physical therapy.  She is sleeping better and is better able to reach and function overhead.  We have also been addressing some back pain associated with her flexed posture and this is expected to continue to improve as long as she maintains compliance with her current home exercise program.  Meyah and I discussed using her last remaining visit or 2 to make sure she is comfortable with her home exercises before transfer into independent rehabilitation.  OBJECTIVE IMPAIRMENTS: decreased activity tolerance, decreased endurance, decreased knowledge of condition, difficulty walking, decreased ROM, decreased strength, decreased safety awareness, increased edema, impaired perceived functional ability, impaired flexibility, impaired UE functional use, improper body mechanics, postural dysfunction, and pain.   ACTIVITY LIMITATIONS: carrying, lifting, dressing, reach over head, and hygiene/grooming  PARTICIPATION LIMITATIONS: meal prep and community activity  PERSONAL FACTORS: Aortic insufficiency, anemia, HLD, hypothyroid, osteoporosis, Bil cataract surgery, Bil TKAs, obesity are also affecting patient's functional outcome.   REHAB POTENTIAL: Good  CLINICAL DECISION MAKING: Stable/uncomplicated  EVALUATION COMPLEXITY: Low   GOALS: Goals reviewed with patient? Yes  SHORT TERM GOALS: Target date: 09/16/2022  Improve bilateral shoulder AROM for flexion to 135 degrees, horizontal adduction to  30 degrees, internal rotation to 50  degrees and external rotation 90 degrees Baseline:  L/R in degrees flexion 125/130; horizontal adduction 10/25; internal rotation 40/50; external rotation 55/90 Goal status: Partially Met 09/09/2022  2.  Barb will be independent with her day 1 HEP Baseline: Started 08/17/2022 Goal status: Met 09/02/2022  3.  Improve bilateral shoulder strength is assessed by hand-held dynamometer and compared to evaluation Baseline: L/R in pounds: IR 10.6/9.6 and ER 5.8/4.7 Goal status: Met 09/09/2022   LONG TERM GOALS: Target date: 10/14/2022  Improve FOTO to 58 in 11 visits Baseline: 50, risk adjusted 48 Goal status: Met 09/09/2022  2.  Improve bilateral shoulder pain to consistently 0-3 out of 10 on the Numeric Pain Rating Scale Baseline: 2-4 out of 10 Goal status: On Going 09/09/2022  3.  Improve bilateral shoulder strength to 15 pounds of internal rotation and 10 pounds of external rotation Baseline: See objective Goal status: Met 09/09/2022  4.  Hannalee will be able to wash her face and hair without having to drop her head due to improved overhead function Baseline: Has to get in a very flexed posture to do these ADLs currently Goal status: Met 09/09/2022  5.  Denece will be independent with her long-term maintenance home exercise program at discharge Baseline: Started 08/19/2022 Goal status: On Going 09/09/2022   PLAN:  PT FREQUENCY: 1-2x/week  PT DURATION: 1-2 weeks  PLANNED INTERVENTIONS: Therapeutic exercises, Therapeutic activity, Neuromuscular re-education, Patient/Family education, Self Care, Joint mobilization, Dry Needling, Cryotherapy, Vasopneumatic device, and Manual therapy  PLAN FOR NEXT SESSION: Review her home exercise program to prepare for independent rehabilitation when she feels confident she can continue this program independently.  Cherlyn Cushing PT, MPT 09/09/22  12:32 PM

## 2022-09-14 ENCOUNTER — Ambulatory Visit: Payer: Medicare Other | Admitting: Rehabilitative and Restorative Service Providers"

## 2022-09-14 ENCOUNTER — Encounter: Payer: Self-pay | Admitting: Rehabilitative and Restorative Service Providers"

## 2022-09-14 DIAGNOSIS — M25612 Stiffness of left shoulder, not elsewhere classified: Secondary | ICD-10-CM | POA: Diagnosis not present

## 2022-09-14 DIAGNOSIS — R600 Localized edema: Secondary | ICD-10-CM

## 2022-09-14 DIAGNOSIS — R293 Abnormal posture: Secondary | ICD-10-CM

## 2022-09-14 DIAGNOSIS — M25511 Pain in right shoulder: Secondary | ICD-10-CM

## 2022-09-14 DIAGNOSIS — G8929 Other chronic pain: Secondary | ICD-10-CM | POA: Diagnosis not present

## 2022-09-14 DIAGNOSIS — M25611 Stiffness of right shoulder, not elsewhere classified: Secondary | ICD-10-CM

## 2022-09-14 DIAGNOSIS — M25512 Pain in left shoulder: Secondary | ICD-10-CM

## 2022-09-14 DIAGNOSIS — M6281 Muscle weakness (generalized): Secondary | ICD-10-CM | POA: Diagnosis not present

## 2022-09-14 NOTE — Therapy (Signed)
OUTPATIENT PHYSICAL THERAPY TREATMENT/DISCHARGE NOTE   Patient Name: Tonya Johns MRN: [id number] DOB:06/05/1930, 89 y.o., female Today's Date: 09/14/2022  END OF SESSION:  PT End of Session - 09/14/22 1132     Visit Number 7    Number of Visits 12    Date for PT Re-Evaluation 10/14/22    Progress Note Due on Visit 12    PT Start Time 1130    PT Stop Time 1209    PT Time Calculation (min) 39 min    Activity Tolerance Patient tolerated treatment well;No increased pain    Behavior During Therapy WFL for tasks assessed/performed            PHYSICAL THERAPY DISCHARGE SUMMARY  Visits from Start of Care: 7  Current functional level related to goals / functional outcomes: See note   Remaining deficits: See note   Education / Equipment: Updated HEP   Patient agrees to discharge. Patient goals were met. Patient is being discharged due to being pleased with the current functional level.    Past Medical History:  Diagnosis Date   AI (aortic insufficiency)    mild-moderate AI   Anemia    Arthritis    Diverticulitis    GERD (gastroesophageal reflux disease)    Hyperlipidemia    Hypothyroidism    Leg swelling    Osteoporosis    Past Surgical History:  Procedure Laterality Date   EYE SURGERY Bilateral    cataract removal   NO PAST SURGERIES     TOTAL KNEE ARTHROPLASTY Left 01/09/2017   Procedure: TOTAL KNEE ARTHROPLASTY;  Surgeon: Tonya Birchwood, MD;  Location: [facility];  Service: Orthopedics;  Laterality: Left;   TOTAL KNEE ARTHROPLASTY Right 11/23/2020   Procedure: RIGHT TOTAL KNEE ARTHROPLASTY;  Surgeon: Tonya Birchwood, MD;  Location: [facility];  Service: Orthopedics;  Laterality: Right;   Patient Active Problem List   Diagnosis Date Noted   COVID 02/09/2022   Arthritis of right knee 11/23/2020   Osteoarthritis of right knee 11/19/2020   Incomplete left bundle branch block (LBBB) 10/08/2020   Nonrheumatic aortic valve insufficiency 10/08/2020   Obesity (BMI 30-39.9)  01/07/2019   Abnormal EKG 03/23/2018   Dizziness 03/23/2018   Gait instability 03/23/2018   Bradycardia 03/23/2018   Primary osteoarthritis of left knee 01/09/2017   Degenerative arthritis of left knee 01/05/2017   Elevated blood pressure reading in office without diagnosis of hypertension 03/09/2015   Lumbar back pain 05/22/2014   Uterine prolapse 02/19/2014   Hypothyroidism following radioiodine therapy 07/26/2011   Elevated glucose 04/07/2011   Multinodular goiter 12/10/2010   Knee pain, bilateral 11/25/2010   Depression 11/25/2010   Heart murmur 11/25/2010   Physical exam 07/14/2010   GERD (gastroesophageal reflux disease) 06/15/2010   Hyperlipidemia 06/15/2010    PCP: Tonya Hatch, MD  REFERRING PROVIDER: Tarry Kos, MD  REFERRING DIAG:  Diagnosis  M25.511,G89.29,M25.512 (ICD-10-CM) - Chronic pain of both shoulders    THERAPY DIAG:  Abnormal posture  Localized edema  Muscle weakness (generalized)  Stiffness of left shoulder, not elsewhere classified  Stiffness of right shoulder, not elsewhere classified  Chronic right shoulder pain  Chronic left shoulder pain  Rationale for Evaluation and Treatment: Rehabilitation  ONSET DATE: Chronic  SUBJECTIVE:  SUBJECTIVE STATEMENT: Tonya Johns notes she had a rough night sleeping last night with all the rain yesterday, although she is better and she has less shoulder and back pain since starting PT.  Hand dominance: Right  PERTINENT HISTORY: Aortic insufficiency, anemia, HLD, hypothyroid, osteoporosis, Bil cataract surgery, Bil TKAs, obesity  PAIN:  NPRS scale: Shoulders 0-5/10 bilateral shoulders, Low back 0-5/10 Pain location: Left > right shoulder Pain description: Dull ache Aggravating factors: Sleep, reaching and overhead  function Relieving factors: Tylenol, heat  PRECAUTIONS: Back  RED FLAGS: None   WEIGHT BEARING RESTRICTIONS: Yes Minimize UE WB  FALLS:  Has patient fallen in last 6 months? No  LIVING ENVIRONMENT: Lives with: lives with their family and lives with their son Lives in: House/apartment Stairs:  Has to use her arms on the handrails Has following equipment at home: Single point cane  OCCUPATION: Retired  PLOF: Independent with basic ADLs  PATIENT GOALS:Use cane without pain, wash hair without bending over  NEXT MD VISIT: If needed  OBJECTIVE:   DIAGNOSTIC FINDINGS:  Mild degenerative changes appropriate for age.  No acute abnormalities.  Both shoulders.  PATIENT SURVEYS:  09/09/2022 FOTO 72 (Goal met)  EVAL: FOTO 50 (risk adjusted 48, goal 58 in 11 visits)  COGNITION: Overall cognitive status: Within functional limits for tasks assessed     SENSATION: Minimal tingling and paresthesias  POSTURE: Significant for forward head, internally rotated and protracted shoulders and forward flexed trunk  UPPER EXTREMITY ROM:   Passive ROM Left/Right 08/19/2022 Left/Right 09/09/2022  Shoulder flexion 125/130 130/145  Shoulder extension    Shoulder abduction    Shoulder horizontal adduction 10/25 40/35  Shoulder internal rotation 40/50 60/50  Shoulder external rotation 55/90 75/90  Elbow flexion    Elbow extension    Wrist flexion    Wrist extension    Wrist ulnar deviation    Wrist radial deviation    Wrist pronation    Wrist supination    (Blank rows = not tested)  UPPER EXTREMITY STRENGTH:   Left/Right 08/19/2022 in pounds assessed with hand-held dynamometer Right 09/07/2022 Left 09/07/2022 Left/Right 09/09/2022 in pounds assessed with hand-held dynamometer  Shoulder flexion  4+/5 4+/5   Shoulder extension      Shoulder abduction  4/5 4/5   Shoulder adduction      Shoulder internal rotation 10.6/9.6 5/5 5/5 17.4/14.6  Shoulder external rotation 5.8/4.7 4+/5 4+/5  16.7/10.9  Middle trapezius      Lower trapezius      Elbow flexion      Elbow extension      Wrist flexion      Wrist extension      Wrist ulnar deviation      Wrist radial deviation      Wrist pronation      Wrist supination      Grip strength (lbs)      (Blank rows = not tested)                  TODAY'S TREATMENT:                                                                 DATE:  09/09/2022 Supine arm raises 2 sets of 20 x 3 seconds with 3 pounds Shoulder blade  pinches 10 x 5 seconds Thera-band ER red 2 sets of 10 times bilaterally with slow eccentrics Thera-band IR red 10 times bilaterally with slow eccentrics Trunk extension AROM 2 sets of 10 for 3 seconds Heel to toe raises 2 sets of 10 for 3 seconds Hip hike hands on counter top 10 x for 3 seconds NuStep Level 5 arms and legs 5 minutes with review of long-term maintenance HEP   09/07/2022 Therex: Nustep lvl 5 8 mins UE/LE Standing green band rows 2 x 15 bilaterally  Standing green band GH ext 2 x 15 bilaterally Standing green band ER 2 x 10 c towel under arm , performed bilaterally  Seated bilateral shoulder flexion AAROM with 1 lb bar x 15 Seated alternating PF/DF x 20 (performed sitting due to general fatigue)   09/02/2022 Supine arm raises 2 sets of 20 x 3 seconds with 3 pounds Shoulder blade pinches 10 x 5 seconds Thera-band ER red 2 sets of 10 times bilaterally with slow eccentrics Thera-band IR red 10 times bilaterally with slow eccentrics Trunk extension AROM 2 sets of 10 for 3 seconds Heel to toe raises 2 sets of 10 for 3 seconds Hip hike hands on counter top 10 x for 3 seconds NuStep Level 5, Hands set at 5 for 5 minutes    PATIENT EDUCATION: Education details: Reviewed exam findings and home exercise program Person educated: Patient Education method: Explanation, Demonstration, Tactile cues, Verbal cues, and Handouts Education comprehension: verbalized understanding, returned demonstration, verbal  cues required, tactile cues required, and needs further education  HOME EXERCISE PROGRAM: Access Code: [email or web address] URL: [email or web address] Date: 08/30/2022 Prepared by: Narda Amber  Exercises - Standing Scapular Retraction  - 5 x daily - 7 x weekly - 1 sets - 5 reps - 5 second hold - Shoulder External Rotation with Anchored Resistance  - 2 x daily - 7 x weekly - 2 sets - 10 reps - 3 hold - Shoulder Internal Rotation with Resistance  - 2 x daily - 7 x weekly - 1 sets - 10 reps - 3 hold - Supine Scapular Protraction in Flexion with Dumbbells  - 2 x daily - 7 x weekly - 1 sets - 20 reps - 3 seconds hold - Standing Lumbar Extension at Wall - Forearms  - 5 x daily - 7 x weekly - 1 sets - 5 reps - 3 seconds [email or web address]  ASSESSMENT:  CLINICAL IMPRESSION: Dyneisha notes significant progress in her pain and function since starting supervised physical therapy.  Overall, she is sleeping better and is better able to reach and function overhead.  Shoulder and back pain should continue to improve as long as she maintains compliance with her current home exercise program.  Lenita reports that she is comfortable with her home exercises and she is ready to transfer into independent rehabilitation.  OBJECTIVE IMPAIRMENTS: decreased activity tolerance, decreased endurance, decreased knowledge of condition, difficulty walking, decreased ROM, decreased strength, decreased safety awareness, increased edema, impaired perceived functional ability, impaired flexibility, impaired UE functional use, improper body mechanics, postural dysfunction, and pain.   ACTIVITY LIMITATIONS: carrying, lifting, dressing, reach over head, and hygiene/grooming  PARTICIPATION LIMITATIONS: meal prep and community activity  PERSONAL FACTORS: Aortic insufficiency, anemia, HLD, hypothyroid, osteoporosis, Bil cataract surgery, Bil TKAs, obesity are also affecting patient's functional outcome.   REHAB POTENTIAL:  Good  CLINICAL DECISION MAKING: Stable/uncomplicated  EVALUATION COMPLEXITY: Low   GOALS: Goals reviewed with patient? Yes  SHORT TERM GOALS: Target date: 09/16/2022  Improve bilateral shoulder  AROM for flexion to 135 degrees, horizontal adduction to 30 degrees, internal rotation to 50 degrees and external rotation 90 degrees Baseline:  L/R in degrees flexion 125/130; horizontal adduction 10/25; internal rotation 40/50; external rotation 55/90 Goal status: Partially Met 09/09/2022  2.  Alaura will be independent with her day 1 HEP Baseline: Started 08/17/2022 Goal status: Met 09/02/2022  3.  Improve bilateral shoulder strength is assessed by hand-held dynamometer and compared to evaluation Baseline: L/R in pounds: IR 10.6/9.6 and ER 5.8/4.7 Goal status: Met 09/09/2022   LONG TERM GOALS: Target date: 10/14/2022  Improve FOTO to 58 in 11 visits Baseline: 50, risk adjusted 48 Goal status: Met 09/09/2022  2.  Improve bilateral shoulder pain to consistently 0-3 out of 10 on the Numeric Pain Rating Scale Baseline: 2-4 out of 10 Goal status: On Going 09/14/2022  3.  Improve bilateral shoulder strength to 15 pounds of internal rotation and 10 pounds of external rotation Baseline: See objective Goal status: Met 09/09/2022  4.  Amora will be able to wash her face and hair without having to drop her head due to improved overhead function Baseline: Has to get in a very flexed posture to do these ADLs currently Goal status: Met 09/09/2022  5.  Brecklyn will be independent with her long-term maintenance home exercise program at discharge Baseline: Started 08/19/2022 Goal status: Met 09/14/2022   PLAN:  PT FREQUENCY: DC  PT DURATION: DC  PLANNED INTERVENTIONS: Therapeutic exercises, Therapeutic activity, Neuromuscular re-education, Patient/Family education, Self Care, Joint mobilization, Dry Needling, Cryotherapy, Vasopneumatic device, and Manual therapy  PLAN FOR NEXT SESSION: DC  Cherlyn Cushing  PT, MPT 09/14/22  12:15 PM

## 2022-09-21 ENCOUNTER — Encounter: Payer: Medicare Other | Admitting: Rehabilitative and Restorative Service Providers"

## 2022-10-06 DIAGNOSIS — E039 Hypothyroidism, unspecified: Secondary | ICD-10-CM | POA: Diagnosis not present

## 2022-10-06 DIAGNOSIS — I129 Hypertensive chronic kidney disease with stage 1 through stage 4 chronic kidney disease, or unspecified chronic kidney disease: Secondary | ICD-10-CM | POA: Diagnosis not present

## 2022-10-06 DIAGNOSIS — N1832 Chronic kidney disease, stage 3b: Secondary | ICD-10-CM | POA: Diagnosis not present

## 2022-10-06 DIAGNOSIS — E785 Hyperlipidemia, unspecified: Secondary | ICD-10-CM | POA: Diagnosis not present

## 2022-10-07 LAB — LAB REPORT - SCANNED
Albumin, Urine POC: 50.5
Creatinine, POC: 120.4 mg/dL
EGFR: 39
Microalb Creat Ratio: 42

## 2022-10-11 ENCOUNTER — Other Ambulatory Visit (HOSPITAL_BASED_OUTPATIENT_CLINIC_OR_DEPARTMENT_OTHER): Payer: Self-pay

## 2022-10-11 MED ORDER — INFLUENZA VAC A&B SURF ANT ADJ 0.5 ML IM SUSY
0.5000 mL | PREFILLED_SYRINGE | Freq: Once | INTRAMUSCULAR | 0 refills | Status: AC
  Filled 2022-10-11: qty 0.5, 1d supply, fill #0

## 2022-10-11 MED ORDER — COVID-19 MRNA VAC-TRIS(PFIZER) 30 MCG/0.3ML IM SUSY
0.3000 mL | PREFILLED_SYRINGE | Freq: Once | INTRAMUSCULAR | 0 refills | Status: AC
  Filled 2022-10-11: qty 0.3, 1d supply, fill #0

## 2022-10-12 ENCOUNTER — Other Ambulatory Visit: Payer: Self-pay | Admitting: Internal Medicine

## 2022-10-12 DIAGNOSIS — N1832 Chronic kidney disease, stage 3b: Secondary | ICD-10-CM

## 2022-10-20 ENCOUNTER — Ambulatory Visit
Admission: RE | Admit: 2022-10-20 | Discharge: 2022-10-20 | Disposition: A | Discharge status: Home / Self Care | Payer: Medicare Other | Source: Ambulatory Visit | Attending: Internal Medicine | Admitting: Internal Medicine

## 2022-10-20 DIAGNOSIS — N1832 Chronic kidney disease, stage 3b: Secondary | ICD-10-CM

## 2022-10-20 DIAGNOSIS — N189 Chronic kidney disease, unspecified: Secondary | ICD-10-CM | POA: Diagnosis not present

## 2022-11-06 ENCOUNTER — Other Ambulatory Visit: Payer: Self-pay | Admitting: Family Medicine

## 2022-11-23 ENCOUNTER — Other Ambulatory Visit: Payer: Self-pay | Admitting: Family Medicine

## 2022-11-23 DIAGNOSIS — H47099 Other disorders of optic nerve, not elsewhere classified, unspecified eye: Secondary | ICD-10-CM

## 2022-11-23 NOTE — Progress Notes (Signed)
Pt's daughter indicated that mom saw [place] and was told she had damage to optic nerve.  Was told there was nothing to be done.  Wants to know if there are any next steps.  At this time, I can refer her to a retinal specialist to see if there is any treatment but told her I wasn't sure they'd have anything to offer.

## 2022-12-13 ENCOUNTER — Encounter: Payer: Self-pay | Admitting: Family Medicine

## 2022-12-13 DIAGNOSIS — H547 Unspecified visual loss: Secondary | ICD-10-CM

## 2022-12-21 ENCOUNTER — Encounter: Payer: Medicare Other | Admitting: Pharmacist

## 2023-01-27 ENCOUNTER — Ambulatory Visit: Payer: Self-pay | Admitting: Family Medicine

## 2023-01-27 DIAGNOSIS — R0981 Nasal congestion: Secondary | ICD-10-CM | POA: Diagnosis not present

## 2023-01-27 DIAGNOSIS — J069 Acute upper respiratory infection, unspecified: Secondary | ICD-10-CM | POA: Diagnosis not present

## 2023-01-27 NOTE — Telephone Encounter (Signed)
Copied from CRM [id number]. Topic: Clinical - Pink Word Triage >> Jan 27, 2023  2:28 PM Irine Seal wrote: Reason for Triage: Pts daughter calling 89 year old mother is sick, cold flu symptoms, was exposed to covid and flu. Seeking medical advice.   Chief Complaint:   Mom has been  sick with symptoms   of cold.  Exposure to  covid, flu, pneumonia  on 01/25/2023 Symptoms:  Productive cough and cold symptoms, running nose clear Loss of taste and smell  Pertinent Negatives: Patient denies SOB  nor Fever. Disposition: [] ED /[x] Urgent Care (no appt availability in office) / [] Appointment(In office/virtual)/ []  [facility]/ [] Home Care/ [] Refused Recommended Disposition /[] [facility]/ []  Follow-up with PCP Additional Notes:  Mother and her daughter decided they would do an in person Urgent Care   for her mother. Reason for Disposition  Cough with cold symptoms (e.g., runny nose, postnasal drip, throat clearing)  [1] COVID-19 EXPOSURE within last 14 days AND [2] requests COVID-19 lab test to return to work AND [3] NO symptoms  [1] COVID-19 EXPOSURE within last 14 days AND [2] weak immune system (e.g., HIV positive, cancer chemo, splenectomy, organ transplant, chronic steroids) AND [3] NO symptoms  Answer Assessment - Initial Assessment Questions 1. ONSET: "When did the cough begin?"      *No Answer* 4-5 days ago 2. SEVERITY: "How bad is the cough today?"       Hard to get up what she coughs 3. SPUTUM: "Describe the color of your sputum" (none, dry cough; clear, white, yellow, green)       Clear - usal l  usal nasal 4. HEMOPTYSIS: "Are you coughing up any blood?" If so ask: "How much?" (flecks, streaks, tablespoons, etc.)     *No Answer* 5. DIFFICULTY BREATHING: "Are you having difficulty breathing?" If Yes, ask: "How bad is it?" (e.g., mild, moderate, severe)    - MILD: No SOB at rest, mild SOB with walking, speaks normally in sentences, can lie down, no retractions, pulse <  100.         6. FEVER: "Do you have a fever?" If Yes, ask: "What is your temperature, how was it measured, and when did it start?"      Denies Fever. 7. CARDIAC HISTORY: "Do you have any history of heart disease?" (e.g., heart attack, congestive heart failure)       Denies. 8. LUNG HISTORY: "Do you have any history of lung disease?"  (e.g., pulmonary embolus, asthma, emphysema)      Denies. 9. PE RISK FACTORS: "Do you have a history of blood clots?" (or: recent major surgery, recent prolonged travel, bedridden)        Denies. 10. OTHER SYMPTOMS: "Do you have any other symptoms?" (e.g., runny nose, wheezing, chest pain)        Denies.  Answer Assessment - Initial Assessment Questions 1. COVID-19 EXPOSURE: "Please describe how you were exposed to someone with a COVID-19 infection." [place] grand daughter was diagnosed a month ago      2. PLACE of CONTACT: "Where were you when you were exposed to COVID-19?" (e.g., home, school, medical waiting room; which city?)      Home christmas gathering 3. TYPE of CONTACT: "How much contact was there?" (e.g., sitting next to, live in same house, work in same office, same building)      Hugs  4. DURATION of CONTACT: "How long were you in contact with the COVID-19 patient?" (e.g., a few seconds, passed  by person, a few minutes, 15 minutes or longer, live with the patient)        Af ew minutes 5. MASK: "Were you wearing a mask?" "Was the other person wearing a mask?" Note: wearing a mask reduces the risk of an otherwise close contact.      Denies 6. DATE of CONTACT: "When did you have contact with a COVID-19 patient?" (e.g., how many days ago)     01/25/23 7. COMMUNITY SPREAD: "Do you live in or have you traveled to an area where there are lots of COVID-19 cases (community spread)?" (See public health department website, if unsure)        Denies 8. SYMPTOMS: "Do you have any symptoms?" (e.g., fever, cough, breathing difficulty, loss of taste or smell)       Cough  9. VACCINE: "Have you gotten the COVID-19 vaccine?" If Yes, ask: "Which one, how many shots, when did you get it?"      Covid and Influenza 11. HIGH RISK: "Do you have any heart or lung problems?" (e.g., asthma, COPD, heart failure) "Do you have a weak immune system or other risk factors?" (e.g., HIV positive, chemotherapy, renal failure, diabetes mellitus, sickle cell anemia, obesity)    Weakened immune system  not r/t health condition  Protocols used: Cough - Acute Productive-A-AH, Coronavirus (COVID-19) Exposure-A-AH

## 2023-02-07 DIAGNOSIS — H353131 Nonexudative age-related macular degeneration, bilateral, early dry stage: Secondary | ICD-10-CM | POA: Diagnosis not present

## 2023-02-07 DIAGNOSIS — H524 Presbyopia: Secondary | ICD-10-CM | POA: Diagnosis not present

## 2023-02-07 DIAGNOSIS — H4720 Unspecified optic atrophy: Secondary | ICD-10-CM | POA: Diagnosis not present

## 2023-02-07 DIAGNOSIS — Z961 Presence of intraocular lens: Secondary | ICD-10-CM | POA: Diagnosis not present

## 2023-03-21 ENCOUNTER — Other Ambulatory Visit: Payer: Self-pay | Admitting: Family Medicine

## 2023-06-13 DIAGNOSIS — H353131 Nonexudative age-related macular degeneration, bilateral, early dry stage: Secondary | ICD-10-CM | POA: Diagnosis not present

## 2023-06-13 DIAGNOSIS — Z961 Presence of intraocular lens: Secondary | ICD-10-CM | POA: Diagnosis not present

## 2023-06-29 ENCOUNTER — Other Ambulatory Visit: Payer: Self-pay | Admitting: Family Medicine

## 2023-07-12 ENCOUNTER — Other Ambulatory Visit: Payer: Self-pay | Admitting: Family Medicine

## 2023-07-27 ENCOUNTER — Ambulatory Visit (INDEPENDENT_AMBULATORY_CARE_PROVIDER_SITE_OTHER): Payer: Medicare Other | Admitting: Family Medicine

## 2023-07-27 ENCOUNTER — Encounter: Payer: Self-pay | Admitting: Family Medicine

## 2023-07-27 VITALS — BP 124/76 | HR 59 | Temp 97.6°F | Ht 63.0 in | Wt 175.2 lb

## 2023-07-27 DIAGNOSIS — Z0000 Encounter for general adult medical examination without abnormal findings: Secondary | ICD-10-CM

## 2023-07-27 DIAGNOSIS — E785 Hyperlipidemia, unspecified: Secondary | ICD-10-CM | POA: Diagnosis not present

## 2023-07-27 DIAGNOSIS — N1832 Chronic kidney disease, stage 3b: Secondary | ICD-10-CM | POA: Diagnosis not present

## 2023-07-27 LAB — CBC WITH DIFFERENTIAL/PLATELET
Basophils Absolute: 0.1 10*3/uL (ref 0.0–0.1)
Basophils Relative: 0.7 % (ref 0.0–3.0)
Eosinophils Absolute: 0.1 10*3/uL (ref 0.0–0.7)
Eosinophils Relative: 1.5 % (ref 0.0–5.0)
HCT: 33.5 % — ABNORMAL LOW (ref 36.0–46.0)
Hemoglobin: 11.3 g/dL — ABNORMAL LOW (ref 12.0–15.0)
Lymphocytes Relative: 25.6 % (ref 12.0–46.0)
Lymphs Abs: 1.9 10*3/uL (ref 0.7–4.0)
MCHC: 33.7 g/dL (ref 30.0–36.0)
MCV: 91.4 fl (ref 78.0–100.0)
Monocytes Absolute: 0.3 10*3/uL (ref 0.1–1.0)
Monocytes Relative: 4.6 % (ref 3.0–12.0)
Neutro Abs: 5.1 10*3/uL (ref 1.4–7.7)
Neutrophils Relative %: 67.6 % (ref 43.0–77.0)
Platelets: 213 10*3/uL (ref 150.0–400.0)
RBC: 3.67 Mil/uL — ABNORMAL LOW (ref 3.87–5.11)
RDW: 13.6 % (ref 11.5–15.5)
WBC: 7.5 10*3/uL (ref 4.0–10.5)

## 2023-07-27 LAB — HEPATIC FUNCTION PANEL
ALT: 10 U/L (ref 0–35)
AST: 16 U/L (ref 0–37)
Albumin: 4.1 g/dL (ref 3.5–5.2)
Alkaline Phosphatase: 68 U/L (ref 39–117)
Bilirubin, Direct: 0 mg/dL (ref 0.0–0.3)
Total Bilirubin: 0.3 mg/dL (ref 0.2–1.2)
Total Protein: 6.8 g/dL (ref 6.0–8.3)

## 2023-07-27 LAB — BASIC METABOLIC PANEL WITH GFR
BUN: 20 mg/dL (ref 6–23)
CO2: 26 meq/L (ref 19–32)
Calcium: 9.6 mg/dL (ref 8.4–10.5)
Chloride: 104 meq/L (ref 96–112)
Creatinine, Ser: 1.37 mg/dL — ABNORMAL HIGH (ref 0.40–1.20)
GFR: 33.83 mL/min — ABNORMAL LOW (ref >60.00)
Glucose, Bld: 101 mg/dL — ABNORMAL HIGH (ref 70–99)
Potassium: 4.1 meq/L (ref 3.5–5.1)
Sodium: 139 meq/L (ref 135–145)

## 2023-07-27 LAB — LIPID PANEL
Cholesterol: 199 mg/dL (ref 0–200)
HDL: 50.3 mg/dL (ref >39.00)
LDL Cholesterol: 110 mg/dL — ABNORMAL HIGH (ref 0–99)
NonHDL: 148.73
Total CHOL/HDL Ratio: 4
Triglycerides: 194 mg/dL — ABNORMAL HIGH (ref 0.0–149.0)
VLDL: 38.8 mg/dL (ref 0.0–40.0)

## 2023-07-27 LAB — TSH: TSH: 35.85 u[IU]/mL — ABNORMAL HIGH (ref 0.35–5.50)

## 2023-07-27 MED ORDER — BENZONATATE 200 MG PO CAPS: 200.0000 mg | ORAL_CAPSULE | Freq: Three times a day (TID) | ORAL | 0 refills | Status: AC | PRN

## 2023-07-27 NOTE — Assessment & Plan Note (Signed)
 Recheck BMP today.

## 2023-07-27 NOTE — Assessment & Plan Note (Signed)
 Pt's PE unchanged from previous and WNL w/ exception of BMI.  UTD on immunizations.  No longer doing other health maintenance.  Check labs.  Anticipatory guidance provided.

## 2023-07-27 NOTE — Patient Instructions (Signed)
 Follow up in 6 months to recheck cholesterol We'll notify you of your lab results and make any changes if needed USE the cough pills as needed Make sure you are drinking LOTS of fluids- particularly in this heat Get the RSV vaccine at your pharmacy at your convenience Keep up the good work on healthy diet and regular physical activity- you look great! Call with any questions or concerns Stay Safe!  Stay Healthy!

## 2023-07-27 NOTE — Progress Notes (Signed)
   Subjective:    Patient ID: Tonya Johns, female    DOB: [birth year], 89 y.o.   MRN: [id number]  HPI CPE- UTD on immunizations.  No longer doing health maintenance.  Patient Care Team    Relationship Specialty Notifications Start End  Mahlon Comer BRAVO, MD PCP - General Family Medicine  08/11/10    Comment: Lavell Lonni Slain, MD PCP - Cardiology Cardiology  10/08/20   Kassie Mallick, MD (Inactive) Consulting Physician Endocrinology  09/05/14   Liam Lerner, MD Consulting Physician Orthopedic Surgery  12/09/16   Nicholaus Sherlean CROME, [facility] (Inactive)  Pharmacist  12/16/20    Comment: [phone]  Valdemar Rogue, MD Consulting Physician Ophthalmology  12/14/22     Health Maintenance  Topic Date Due   Medicare Annual Wellness (AWV)  08/25/2023   COVID-19 Vaccine (8 - 2024-25 season) 08/12/2023 (Originally 04/10/2023)   INFLUENZA VACCINE  09/01/2023   DTaP/Tdap/Td (2 - Td or Tdap) 07/14/2029   Pneumococcal Vaccine: 50+ Years  Completed   DEXA SCAN  Completed   Zoster Vaccines- Shingrix  Completed   Hepatitis B Vaccines  Aged Out   HPV VACCINES  Aged Out   Meningococcal B Vaccine  Aged Out      Review of Systems Patient reports no vision/ hearing changes, adenopathy,fever, weight change,  persistant/recurrent hoarseness , swallowing issues, chest pain, palpitations, edema, persistant/recurrent cough, hemoptysis, dyspnea (rest/exertional/paroxysmal nocturnal), gastrointestinal bleeding (melena, rectal bleeding), abdominal pain, significant heartburn, bowel changes, GU symptoms (dysuria, hematuria, incontinence), Gyn symptoms (abnormal  bleeding, pain),  syncope, focal weakness, memory loss, numbness & tingling, skin/hair/nail changes, abnormal bruising or bleeding, anxiety, or depression.   + cough- started 10-12 days ago.  At this point is described as a 'nuisance'.  No fever.  Denies sinus pain/pressure but does have congestion.    Objective:   Physical Exam General Appearance:     Alert, cooperative, no distress, appears stated age  Head:    Normocephalic, without obvious abnormality, atraumatic  Eyes:    PERRL, conjunctiva/corneas clear, EOM's intact both eyes  Ears:    Normal TM's and external ear canals, both ears  Nose:   Nares normal, septum midline, mucosa normal, no drainage    or sinus tenderness  Throat:   Lips, mucosa, and tongue normal; teeth and gums normal  Neck:   Supple, symmetrical, trachea midline, no adenopathy;    Thyroid : no enlargement/tenderness/nodules  Back:     Symmetric, no curvature, ROM normal, no CVA tenderness  Lungs:     Clear to auscultation bilaterally, respirations unlabored  Chest Wall:    No tenderness or deformity   Heart:    Regular rate and rhythm, S1 and S2 normal, no murmur, rub   or gallop  Breast Exam:    Deferred  Abdomen:     Soft, non-tender, bowel sounds active all four quadrants,    no masses, no organomegaly  Genitalia:    Deferred  Rectal:    Extremities:   Extremities normal, atraumatic, no cyanosis or edema  Pulses:   2+ and symmetric all extremities  Skin:   Skin color, texture, turgor normal, no rashes or lesions  Lymph nodes:   Cervical, supraclavicular, and axillary nodes normal  Neurologic:   CNII-XII intact, normal strength, sensation and reflexes    throughout          Assessment & Plan:

## 2023-08-01 ENCOUNTER — Ambulatory Visit: Payer: Self-pay | Admitting: Family Medicine

## 2023-08-01 ENCOUNTER — Other Ambulatory Visit: Payer: Self-pay

## 2023-08-01 DIAGNOSIS — E039 Hypothyroidism, unspecified: Secondary | ICD-10-CM

## 2023-08-01 MED ORDER — LEVOTHYROXINE SODIUM 112 MCG PO TABS: 112.0000 ug | ORAL_TABLET | Freq: Every day | ORAL | 1 refills | Status: AC

## 2023-08-01 NOTE — Telephone Encounter (Signed)
 Pt has been notified Lab visit has been made New dose of Levothyroxine  has been sent in.

## 2023-08-01 NOTE — Telephone Encounter (Signed)
-----   Message from Comer Greet sent at 08/01/2023  7:33 AM EDT ----- Your TSH (thyroid  hormone) has jumped from 3.7 --> 35.85  Are you still taking your Levothyroxine  daily?  If not, please restart.  If yes, we need to increase the dose to 112mcg daily (#30, 3  refills).  Either way, we need to repeat your TSH level at a lab only visit in 1 month (TSH, dx hypothyroid)  Remainder of labs are stable and look great! ----- Message ----- From: Interface, Lab In Three Zero One Sent: 07/27/2023   2:44 PM EDT To: Comer FORBES Greet, MD

## 2023-08-14 ENCOUNTER — Other Ambulatory Visit (HOSPITAL_BASED_OUTPATIENT_CLINIC_OR_DEPARTMENT_OTHER): Payer: Self-pay

## 2023-08-14 ENCOUNTER — Encounter: Payer: Self-pay | Admitting: Family Medicine

## 2023-08-14 MED ORDER — RSVPREF3 VAC RECOMB ADJUVANTED 120 MCG/0.5ML IM SUSR
0.5000 mL | Freq: Once | INTRAMUSCULAR | 0 refills | Status: AC
  Filled 2023-08-14: qty 0.5, 1d supply, fill #0

## 2023-09-01 ENCOUNTER — Other Ambulatory Visit

## 2023-09-01 DIAGNOSIS — E039 Hypothyroidism, unspecified: Secondary | ICD-10-CM

## 2023-09-01 LAB — TSH: TSH: 0.5 u[IU]/mL (ref 0.35–5.50)

## 2023-09-05 ENCOUNTER — Ambulatory Visit: Payer: Self-pay | Admitting: Family Medicine

## 2023-09-05 NOTE — Telephone Encounter (Signed)
 Copied from CRM #[id number]. Topic: Clinical - Lab/Test Results >> Sep 05, 2023  8:59 AM Mesmerise C wrote: Reason for CRM: Patient returning a call advised it was for her TSH results relayed verbatim patient had no further questions

## 2023-09-05 NOTE — Progress Notes (Signed)
 Called patient to relay lab results. Left vm to return call

## 2023-10-17 ENCOUNTER — Other Ambulatory Visit (HOSPITAL_BASED_OUTPATIENT_CLINIC_OR_DEPARTMENT_OTHER): Payer: Self-pay

## 2023-10-17 DIAGNOSIS — N1832 Chronic kidney disease, stage 3b: Secondary | ICD-10-CM | POA: Diagnosis not present

## 2023-10-17 DIAGNOSIS — E039 Hypothyroidism, unspecified: Secondary | ICD-10-CM | POA: Diagnosis not present

## 2023-10-17 DIAGNOSIS — E785 Hyperlipidemia, unspecified: Secondary | ICD-10-CM | POA: Diagnosis not present

## 2023-10-17 DIAGNOSIS — I129 Hypertensive chronic kidney disease with stage 1 through stage 4 chronic kidney disease, or unspecified chronic kidney disease: Secondary | ICD-10-CM | POA: Diagnosis not present

## 2023-10-17 MED ORDER — COMIRNATY 30 MCG/0.3ML IM SUSY
0.3000 mL | PREFILLED_SYRINGE | Freq: Once | INTRAMUSCULAR | 0 refills | Status: AC
  Filled 2023-10-17: qty 0.3, 1d supply, fill #0

## 2023-10-17 MED ORDER — FLUZONE HIGH-DOSE 0.5 ML IM SUSY
0.5000 mL | PREFILLED_SYRINGE | Freq: Once | INTRAMUSCULAR | 0 refills | Status: AC
Start: 2023-10-17 — End: 2023-10-18
  Filled 2023-10-17: qty 0.5, 1d supply, fill #0

## 2023-10-19 LAB — LAB REPORT - SCANNED
Albumin, Urine POC: 36
Creatinine, POC: 131.1 mg/dL
EGFR: 35
Microalb Creat Ratio: 27

## 2023-11-07 ENCOUNTER — Ambulatory Visit (INDEPENDENT_AMBULATORY_CARE_PROVIDER_SITE_OTHER): Admitting: *Deleted

## 2023-11-07 VITALS — Ht 64.0 in | Wt 175.0 lb

## 2023-11-07 DIAGNOSIS — Z0000 Encounter for general adult medical examination without abnormal findings: Secondary | ICD-10-CM

## 2023-11-07 NOTE — Progress Notes (Signed)
 Subjective:   Tonya Johns is a 89 y.o. female who presents for Medicare Annual (Subsequent) preventive examination.  Visit Complete: Virtual I connected with  Tonya Johns on 11/07/23 by a audio enabled telemedicine application and verified that I am speaking with the correct person using two identifiers.  Patient Location: Home  Provider Location: Home Office  I discussed the limitations of evaluation and management by telemedicine. The patient expressed understanding and agreed to proceed.  Vital Signs: Because this visit was a virtual/telehealth visit, some criteria may be missing or patient reported. Any vitals not documented were not able to be obtained and vitals that have been documented are patient reported.  Cardiac Risk Factors include: advanced age (>38men, >44 women);family history of premature cardiovascular disease;obesity (BMI >30kg/m2)     Objective:    Today's Vitals   11/07/23 1019  Weight: 175 lb (79.4 kg)  Height: 5' 4 (1.626 m)   Body mass index is 30.04 kg/m.     11/07/2023   10:15 AM 08/25/2022    8:17 AM 08/18/2021    3:10 PM 11/23/2020    5:00 PM 11/23/2020    9:39 AM 11/20/2020    8:21 AM 08/17/2020    2:26 PM  Advanced Directives  Does Patient Have a Medical Advance Directive? Yes Yes Yes Yes Yes Yes Yes  Type of Sales promotion account executive of [employer] Power of [place];Living will Healthcare Power of [place];Living will Healthcare Power of [place];Living will Healthcare Power of [place];Living will Healthcare Power of Attorney  Does patient want to make changes to medical advance directive?    No - Patient declined No - Patient declined No - Patient declined   Copy of Healthcare Power of Attorney in Chart? Yes - validated most recent copy scanned in chart (See row information) Yes - validated most recent copy scanned in chart (See row information) No - copy requested No - copy requested No - copy  requested No - copy requested Yes - validated most recent copy scanned in chart (See row information)    Current Medications (verified) Outpatient Encounter Medications as of 11/07/2023  Medication Sig   aspirin  EC 81 MG tablet Take 1 tablet (81 mg total) by mouth 2 (two) times daily.   benzonatate  (TESSALON ) 200 MG capsule Take 1 capsule (200 mg total) by mouth 3 (three) times daily as needed.   Camphor-Menthol -Methyl Sal (SALONPAS) 3.02-06-08 % PTCH Apply 1 patch topically daily as needed (pain).   Coenzyme Q10 (CO Q10) 100 MG CAPS Take 100 mg by mouth at bedtime.   famotidine  (PEPCID ) 20 MG tablet TAKE 1 TABLET BY MOUTH DAILY   feeding supplement, GLUCERNA SHAKE, (GLUCERNA SHAKE) LIQD Take 237 mLs by mouth 2 (two) times daily.   FLUoxetine  (PROZAC ) 10 MG capsule TAKE 1 CAPSULE BY MOUTH DAILY   levothyroxine  (SYNTHROID ) 112 MCG tablet Take 1 tablet (112 mcg total) by mouth daily.   Menthol , Topical Analgesic, (BIOFREEZE ROLL-ON) 4 % GEL Apply 1 application topically daily as needed (pain).   Menthol , Topical Analgesic, (BLUE-EMU MAXIMUM STRENGTH) 2.5 % LIQD Apply 1 application topically daily as needed (pain).   Multiple Vitamin (MULTIVITAMIN WITH MINERALS) TABS tablet Take 1 tablet by mouth at bedtime.   pravastatin  (PRAVACHOL ) 80 MG tablet TAKE 1 TABLET BY MOUTH DAILY   No facility-administered encounter medications on file as of 11/07/2023.    Allergies (verified) Penicillins   History: Past Medical History:  Diagnosis Date   AI (aortic insufficiency)  mild-moderate AI   Anemia    Arthritis    Diverticulitis    GERD (gastroesophageal reflux disease)    Hyperlipidemia    Hypothyroidism    Leg swelling    Osteoporosis    Past Surgical History:  Procedure Laterality Date   EYE SURGERY Bilateral    cataract removal   NO PAST SURGERIES     TOTAL KNEE ARTHROPLASTY Left 01/09/2017   Procedure: TOTAL KNEE ARTHROPLASTY;  Surgeon: Liam Lerner, MD;  Location: [facility] OR;  Service:  Orthopedics;  Laterality: Left;   TOTAL KNEE ARTHROPLASTY Right 11/23/2020   Procedure: RIGHT TOTAL KNEE ARTHROPLASTY;  Surgeon: Liam Lerner, MD;  Location: [facility];  Service: Orthopedics;  Laterality: Right;   Family History  Problem Relation Age of Onset   Alcohol abuse Brother    Alcohol abuse Sister    Arthritis Mother    Stroke Mother    Hypertension Mother    Arthritis Father    Heart disease Father    Hyperlipidemia Brother    Hyperlipidemia Sister    Sudden death Brother    Mental illness Sister    Diabetes Sister    Cancer Daughter        breast   Social History   Socioeconomic History   Marital status: Single    Spouse name: Not on file   Number of children: 3   Years of education: Not on file   Highest education level: Not on file  Occupational History   Occupation: retired  Tobacco Use   Smoking status: Never   Smokeless tobacco: Never  Vaping Use   Vaping status: Never Used  Substance and Sexual Activity   Alcohol use: No   Drug use: No   Sexual activity: Not Currently  Other Topics Concern   Not on file  Social History Narrative   Not on file   Social Drivers of Health   Financial Resource Strain: Low Risk  (11/07/2023)   Overall Financial Resource Strain (CARDIA)    Difficulty of Paying Living Expenses: Not hard at all  Food Insecurity: No Food Insecurity (11/07/2023)   Hunger Vital Sign    Worried About Running Out of Food in the Last Year: Never true    Ran Out of Food in the Last Year: Never true  Transportation Needs: No Transportation Needs (11/07/2023)   PRAPARE - Administrator, Civil Service (Medical): No    Lack of Transportation (Non-Medical): No  Physical Activity: Inactive (11/07/2023)   Exercise Vital Sign    Days of Exercise per Week: 0 days    Minutes of Exercise per Session: 0 min  Stress: No Stress Concern Present (11/07/2023)   [employer] of Occupational Health - Occupational Stress Questionnaire    Feeling  of Stress: Not at all  Social Connections: Moderately Integrated (11/07/2023)   Social Connection and Isolation Panel    Frequency of Communication with Friends and Family: More than three times a week    Frequency of Social Gatherings with Friends and Family: More than three times a week    Attends Religious Services: More than 4 times per year    Active Member of [employer] or Organizations: Yes    Attends Banker Meetings: More than 4 times per year    Marital Status: Widowed    Tobacco Counseling Counseling given: Not Answered   Clinical Intake:  Pre-visit preparation completed: Yes  Pain : No/denies pain     Diabetes: No  How often  do you need to have someone help you when you read instructions, pamphlets, or other written materials from your doctor or pharmacy?: 1 - Never  Interpreter Needed?: No  Information entered by :: Mliss Graff LPN   Activities of Daily Living    11/07/2023   10:18 AM  In your present state of health, do you have any difficulty performing the following activities:  Hearing? 0  Vision? 0  Difficulty concentrating or making decisions? 0  Walking or climbing stairs? 1  Dressing or bathing? 0  Doing errands, shopping? 0  Preparing Food and eating ? N  Using the Toilet? N  In the past six months, have you accidently leaked urine? N  Do you have problems with loss of bowel control? N  Managing your Medications? N  Managing your Finances? N    Patient Care Team: Mahlon Comer BRAVO, MD as PCP - General (Family Medicine) Lonni Slain, MD as PCP - Cardiology (Cardiology) Kassie Mallick, MD (Inactive) as Consulting Physician (Endocrinology) Liam Lerner, MD as Consulting Physician (Orthopedic Surgery) Nicholaus Sherlean CROME, [facility] (Inactive) (Pharmacist) Valdemar Rogue, MD as Consulting Physician (Ophthalmology)  Indicate any recent Medical Services you may have received from other than [facility] providers in the past year (date may be  approximate).     Assessment:   This is a routine wellness examination for [place].  Hearing/Vision screen Hearing Screening - Comments:: No trouble hearing Vision Screening - Comments:: Up to date Unsure of name   Goals Addressed             This Visit's Progress    Patient Stated   On track    Maintain current health by staying active.      Patient Stated   On track    Maintain current lifestyle     Patient Stated   On track    Eat Healthier     Patient Stated       Continue current lifestyle       Depression Screen    11/07/2023   10:19 AM 07/27/2023   10:00 AM 08/25/2022    8:24 AM 07/26/2022    9:44 AM 02/09/2022   10:13 AM 08/18/2021    3:11 PM 08/18/2021    3:09 PM  PHQ 2/9 Scores  PHQ - 2 Score 0 2 2 2 4  0 0  PHQ- 9 Score 4 9 11 10 15       Fall Risk    11/07/2023   10:14 AM 07/27/2023   10:01 AM 08/25/2022    8:17 AM 08/24/2022    4:23 PM 07/26/2022    9:44 AM  Fall Risk   Falls in the past year? 0 0 1 1 1   Number falls in past yr: 0 0 1 1 0  Injury with Fall? 0 0 1 0 0  Risk for fall due to :  No Fall Risks Impaired mobility  History of fall(s)  Follow up Falls evaluation completed;Education provided;Falls prevention discussed Falls evaluation completed Falls evaluation completed;Education provided;Falls prevention discussed  Falls evaluation completed    MEDICARE RISK AT HOME: Medicare Risk at Home Any stairs in or around the home?: No If so, are there any without handrails?: No Home free of loose throw rugs in walkways, pet beds, electrical cords, etc?: Yes Adequate lighting in your home to reduce risk of falls?: Yes Life alert?: No Use of a cane, walker or w/c?: Yes Grab bars in the bathroom?: Yes Shower chair or bench in shower?: Yes  Elevated toilet seat or a handicapped toilet?: Yes  TIMED UP AND GO:  Was the test performed?  No    Cognitive Function:    07/13/2018    2:12 PM 03/23/2017    9:05 AM 09/05/2014    3:14 PM  MMSE - Mini  Mental State Exam  Not completed: Unable to complete    Orientation to time  5  5   Orientation to Place  5  5   Registration  3  3   Attention/ Calculation  4  5   Recall  3  2   Language- name 2 objects  2  2   Language- repeat  1 1  Language- follow 3 step command  3  3   Language- read & follow direction  1  1   Write a sentence  1  1   Copy design  0  1   Total score  28  29      Data saved with a previous flowsheet row definition        11/07/2023   10:17 AM 08/25/2022    8:21 AM 08/13/2019   12:04 PM  6CIT Screen  What Year? 0 points 4 points 0 points  What month? 0 points 0 points 0 points  What time? 0 points 0 points 0 points  Count back from 20 0 points 0 points 0 points  Months in reverse 0 points 4 points 0 points  Repeat phrase 0 points 0 points 2 points  Total Score 0 points 8 points 2 points    Immunizations Immunization History  Administered Date(s) Administered   Fluad  Quad(high Dose 65+) 10/02/2018, 09/29/2020, 11/16/2021   Fluad  Trivalent(High Dose 65+) 10/11/2022   INFLUENZA, HIGH DOSE SEASONAL PF 10/17/2023   Influenza Split 11/25/2010, 11/16/2011   Influenza,inj,Quad PF,6+ Mos 12/06/2012, 11/07/2013, 10/23/2014, 11/26/2015, 09/20/2016, 09/30/2017   Influenza-Unspecified 10/15/2019   PFIZER Comirnaty (Gray Top)Covid-19 Tri-Sucrose Vaccine 06/15/2020   PFIZER(Purple Top)SARS-COV-2 Vaccination 02/10/2019, 03/03/2019, 10/15/2019   Pfizer Covid-19 Vaccine Bivalent Booster 72yrs & up 10/27/2020   Pfizer(Comirnaty )Fall Seasonal Vaccine 12 years and older 11/16/2021, 10/11/2022, 10/17/2023   Pneumococcal Conjugate-13 02/19/2014   Pneumococcal Polysaccharide-23 03/10/2016   Respiratory Syncytial Virus Vaccine ,Recomb Aduvanted(Arexvy ) 08/14/2023   Tdap 07/15/2019   Zoster Recombinant(Shingrix) 04/28/2017, 09/30/2017    TDAP status: Due, Education has been provided regarding the importance of this vaccine. Advised may receive this vaccine at local pharmacy  or Health Dept. Aware to provide a copy of the vaccination record if obtained from local pharmacy or Health Dept. Verbalized acceptance and understanding.  Flu Vaccine status: Up to date  Pneumococcal vaccine status: Up to date  Covid-19 vaccine status: Completed vaccines  Qualifies for Shingles Vaccine? No   Zostavax completed Yes   Shingrix Completed?: Yes  Screening Tests Health Maintenance  Topic Date Due   Medicare Annual Wellness (AWV)  11/06/2024   DTaP/Tdap/Td (2 - Td or Tdap) 07/14/2029   Pneumococcal Vaccine: 50+ Years  Completed   Influenza Vaccine  Completed   DEXA SCAN  Completed   COVID-19 Vaccine  Completed   Zoster Vaccines- Shingrix  Completed   Meningococcal B Vaccine  Aged Out   Mammogram  Discontinued    Health Maintenance  There are no preventive care reminders to display for this patient.   Colorectal cancer screening: No longer required.   Mammogram status: No longer required due to  .  Bone Density status: Completed 2015. Results reflect: Bone density results: OSTEOPOROSIS. Repeat every  years.   Education provided  Lung Cancer Screening: (Low Dose CT Chest recommended if Age 4-80 years, 20 pack-year currently smoking OR have quit w/in 15years.) does not qualify.   Lung Cancer Screening Referral:   Additional Screening:  Hepatitis C Screening: does not qualify; Completed 2018  Vision Screening: Recommended annual ophthalmology exams for early detection of glaucoma and other disorders of the eye. Is the patient up to date with their annual eye exam?  Yes  Who is the provider or what is the name of the office in which the patient attends annual eye exams? Unsure of name If pt is not established with a provider, would they like to be referred to a provider to establish care? No .   Dental Screening: Recommended annual dental exams for proper oral hygiene    Community Resource Referral / Chronic Care Management: CRR required this visit?  No    CCM required this visit?  No     Plan:     I have personally reviewed and noted the following in the patient's chart:   Medical and social history Use of alcohol, tobacco or illicit drugs  Current medications and supplements including opioid prescriptions. Patient is not currently taking opioid prescriptions. Functional ability and status Nutritional status Physical activity Advanced directives List of other physicians Hospitalizations, surgeries, and ER visits in previous 12 months Vitals Screenings to include cognitive, depression, and falls Referrals and appointments  In addition, I have reviewed and discussed with patient certain preventive protocols, quality metrics, and best practice recommendations. A written personalized care plan for preventive services as well as general preventive health recommendations were provided to patient.     Mliss Graff, LPN   [id number]   After Visit Summary: (MyChart) Due to this being a telephonic visit, the after visit summary with patients personalized plan was offered to patient via MyChart   Nurse Notes:

## 2023-11-07 NOTE — Patient Instructions (Signed)
 Tonya Johns , Thank you for taking time to come for your Medicare Wellness Visit. I appreciate your ongoing commitment to your health goals. Please review the following plan we discussed and let me know if I can assist you in the future.   Screening recommendations/referrals: Colonoscopy: no longer required Mammogram: no longer required Bone Density: no longer required Recommended yearly ophthalmology/optometry visit for glaucoma screening and checkup Recommended yearly dental visit for hygiene and checkup  Vaccinations: Influenza vaccine: up to date Pneumococcal vaccine: up to date Tdap vaccine: Education provided Shingles vaccine: up to date      Preventive Care 65 Years and Older, Female Preventive care refers to lifestyle choices and visits with your health care provider that can promote health and wellness. What does preventive care include? A yearly physical exam. This is also called an annual well check. Dental exams once or twice a year. Routine eye exams. Ask your health care provider how often you should have your eyes checked. Personal lifestyle choices, including: Daily care of your teeth and gums. Regular physical activity. Eating a healthy diet. Avoiding tobacco and drug use. Limiting alcohol use. Practicing safe sex. Taking low-dose aspirin  every day. Taking vitamin and mineral supplements as recommended by your health care provider. What happens during an annual well check? The services and screenings done by your health care provider during your annual well check will depend on your age, overall health, lifestyle risk factors, and family history of disease. Counseling  Your health care provider may ask you questions about your: Alcohol use. Tobacco use. Drug use. Emotional well-being. Home and relationship well-being. Sexual activity. Eating habits. History of falls. Memory and ability to understand (cognition). Work and work Astronomer. Reproductive  health. Screening  You may have the following tests or measurements: Height, weight, and BMI. Blood pressure. Lipid and cholesterol levels. These may be checked every 5 years, or more frequently if you are over 62 years old. Skin check. Lung cancer screening. You may have this screening every year starting at age 31 if you have a 30-pack-year history of smoking and currently smoke or have quit within the past 15 years. Fecal occult blood test (FOBT) of the stool. You may have this test every year starting at age 20. Flexible sigmoidoscopy or colonoscopy. You may have a sigmoidoscopy every 5 years or a colonoscopy every 10 years starting at age 61. Hepatitis C blood test. Hepatitis B blood test. Sexually transmitted disease (STD) testing. Diabetes screening. This is done by checking your blood sugar (glucose) after you have not eaten for a while (fasting). You may have this done every 1-3 years. Bone density scan. This is done to screen for osteoporosis. You may have this done starting at age 24. Mammogram. This may be done every 1-2 years. Talk to your health care provider about how often you should have regular mammograms. Talk with your health care provider about your test results, treatment options, and if necessary, the need for more tests. Vaccines  Your health care provider may recommend certain vaccines, such as: Influenza vaccine. This is recommended every year. Tetanus, diphtheria, and acellular pertussis (Tdap, Td) vaccine. You may need a Td booster every 10 years. Zoster vaccine. You may need this after age 79. Pneumococcal 13-valent conjugate (PCV13) vaccine. One dose is recommended after age 60. Pneumococcal polysaccharide (PPSV23) vaccine. One dose is recommended after age 38. Talk to your health care provider about which screenings and vaccines you need and how often you need them. This information  is not intended to replace advice given to you by your health care provider.  Make sure you discuss any questions you have with your health care provider. Document Released: 02/13/2015 Document Revised: 10/07/2015 Document Reviewed: 11/18/2014 Elsevier Interactive Patient Education  2017 [employer].  Fall Prevention in the Home Falls can cause injuries. They can happen to people of all ages. There are many things you can do to make your home safe and to help prevent falls. What can I do on the outside of my home? Regularly fix the edges of walkways and driveways and fix any cracks. Remove anything that might make you trip as you walk through a door, such as a raised step or threshold. Trim any bushes or trees on the path to your home. Use bright outdoor lighting. Clear any walking paths of anything that might make someone trip, such as rocks or tools. Regularly check to see if handrails are loose or broken. Make sure that both sides of any steps have handrails. Any raised decks and porches should have guardrails on the edges. Have any leaves, snow, or ice cleared regularly. Use sand or salt on walking paths during winter. Clean up any spills in your garage right away. This includes oil or grease spills. What can I do in the bathroom? Use night lights. Install grab bars by the toilet and in the tub and shower. Do not use towel bars as grab bars. Use non-skid mats or decals in the tub or shower. If you need to sit down in the shower, use a plastic, non-slip stool. Keep the floor dry. Clean up any water  that spills on the floor as soon as it happens. Remove soap buildup in the tub or shower regularly. Attach bath mats securely with double-sided non-slip rug tape. Do not have throw rugs and other things on the floor that can make you trip. What can I do in the bedroom? Use night lights. Make sure that you have a light by your bed that is easy to reach. Do not use any sheets or blankets that are too big for your bed. They should not hang down onto the floor. Have a  firm chair that has side arms. You can use this for support while you get dressed. Do not have throw rugs and other things on the floor that can make you trip. What can I do in the kitchen? Clean up any spills right away. Avoid walking on wet floors. Keep items that you use a lot in easy-to-reach places. If you need to reach something above you, use a strong step stool that has a grab bar. Keep electrical cords out of the way. Do not use floor polish or wax that makes floors slippery. If you must use wax, use non-skid floor wax. Do not have throw rugs and other things on the floor that can make you trip. What can I do with my stairs? Do not leave any items on the stairs. Make sure that there are handrails on both sides of the stairs and use them. Fix handrails that are broken or loose. Make sure that handrails are as long as the stairways. Check any carpeting to make sure that it is firmly attached to the stairs. Fix any carpet that is loose or worn. Avoid having throw rugs at the top or bottom of the stairs. If you do have throw rugs, attach them to the floor with carpet tape. Make sure that you have a light switch at the top of  the stairs and the bottom of the stairs. If you do not have them, ask someone to add them for you. What else can I do to help prevent falls? Wear shoes that: Do not have high heels. Have rubber bottoms. Are comfortable and fit you well. Are closed at the toe. Do not wear sandals. If you use a stepladder: Make sure that it is fully opened. Do not climb a closed stepladder. Make sure that both sides of the stepladder are locked into place. Ask someone to hold it for you, if possible. Clearly mark and make sure that you can see: Any grab bars or handrails. First and last steps. Where the edge of each step is. Use tools that help you move around (mobility aids) if they are needed. These include: Canes. Walkers. Scooters. Crutches. Turn on the lights when you  go into a dark area. Replace any light bulbs as soon as they burn out. Set up your furniture so you have a clear path. Avoid moving your furniture around. If any of your floors are uneven, fix them. If there are any pets around you, be aware of where they are. Review your medicines with your doctor. Some medicines can make you feel dizzy. This can increase your chance of falling. Ask your doctor what other things that you can do to help prevent falls. This information is not intended to replace advice given to you by your health care provider. Make sure you discuss any questions you have with your health care provider. Document Released: 11/13/2008 Document Revised: 06/25/2015 Document Reviewed: 02/21/2014 Elsevier Interactive Patient Education  2017 [employer].

## 2023-11-27 ENCOUNTER — Ambulatory Visit: Payer: Self-pay
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
## 2023-11-28 ENCOUNTER — Ambulatory Visit (INDEPENDENT_AMBULATORY_CARE_PROVIDER_SITE_OTHER): Admitting: Family Medicine

## 2023-11-28 ENCOUNTER — Encounter: Payer: Self-pay | Admitting: Family Medicine

## 2023-11-28 VITALS — BP 160/70 | HR 79 | Temp 98.0°F | Ht 64.0 in | Wt 170.0 lb

## 2023-11-28 DIAGNOSIS — T25222A Burn of second degree of left foot, initial encounter: Secondary | ICD-10-CM | POA: Diagnosis not present
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
## 2023-11-28 NOTE — Patient Instructions (Signed)
 Follow up as needed or as scheduled Keep the foot covered in a thin layer of aquaphor, bacitracin, or Vaseline and apply a nonstick pad Thankfully you were blessed on Saturday! Call with any questions or concerns Stay Safe!  Stay Healthy! Hang in there!!!

## 2023-12-14 ENCOUNTER — Other Ambulatory Visit: Payer: Self-pay | Admitting: Family Medicine

## 2023-12-27 ENCOUNTER — Encounter: Payer: Self-pay | Admitting: Family Medicine

## 2024-01-01 NOTE — Telephone Encounter (Signed)
 Patient has a 6 month appointment coming up on 01/29/2024. Patient last seen you for a physical on 07/27/2023.  Patient is needing a renewal application for her handicap placard. Are you okay with updating this now or would you like to wait for her appointment on the 29th of December? The placard does not expire until 1/331/2026.

## 2024-01-29 ENCOUNTER — Encounter: Payer: Self-pay | Admitting: Family Medicine

## 2024-01-29 ENCOUNTER — Ambulatory Visit (INDEPENDENT_AMBULATORY_CARE_PROVIDER_SITE_OTHER): Admitting: Family Medicine

## 2024-01-29 VITALS — BP 136/86 | HR 76 | Temp 98.4°F | Ht 64.0 in | Wt 170.0 lb

## 2024-01-29 DIAGNOSIS — E663 Overweight: Secondary | ICD-10-CM

## 2024-01-29 DIAGNOSIS — E890 Postprocedural hypothyroidism: Secondary | ICD-10-CM | POA: Diagnosis not present

## 2024-01-29 DIAGNOSIS — E785 Hyperlipidemia, unspecified: Secondary | ICD-10-CM

## 2024-01-29 LAB — BASIC METABOLIC PANEL WITH GFR
BUN: 16 mg/dL (ref 6–23)
CO2: 28 meq/L (ref 19–32)
Calcium: 9.4 mg/dL (ref 8.4–10.5)
Chloride: 103 meq/L (ref 96–112)
Creatinine, Ser: 1.36 mg/dL — ABNORMAL HIGH (ref 0.40–1.20)
GFR: 34.01 mL/min — ABNORMAL LOW
Glucose, Bld: 103 mg/dL — ABNORMAL HIGH (ref 70–99)
Potassium: 4.4 meq/L (ref 3.5–5.1)
Sodium: 140 meq/L (ref 135–145)

## 2024-01-29 LAB — LIPID PANEL
Cholesterol: 170 mg/dL (ref 28–200)
HDL: 59.1 mg/dL
LDL Cholesterol: 86 mg/dL (ref 10–99)
NonHDL: 111.13
Total CHOL/HDL Ratio: 3
Triglycerides: 124 mg/dL (ref 10.0–149.0)
VLDL: 24.8 mg/dL (ref 0.0–40.0)

## 2024-01-29 LAB — HEPATIC FUNCTION PANEL
ALT: 10 U/L (ref 3–35)
AST: 15 U/L (ref 5–37)
Albumin: 4.2 g/dL (ref 3.5–5.2)
Alkaline Phosphatase: 70 U/L (ref 39–117)
Bilirubin, Direct: 0.1 mg/dL (ref 0.1–0.3)
Total Bilirubin: 0.3 mg/dL (ref 0.2–1.2)
Total Protein: 6.5 g/dL (ref 6.0–8.3)

## 2024-01-29 LAB — CBC WITH DIFFERENTIAL/PLATELET
Basophils Absolute: 0 K/uL (ref 0.0–0.1)
Basophils Relative: 0.5 % (ref 0.0–3.0)
Eosinophils Absolute: 0.1 K/uL (ref 0.0–0.7)
Eosinophils Relative: 1.3 % (ref 0.0–5.0)
HCT: 32.5 % — ABNORMAL LOW (ref 36.0–46.0)
Hemoglobin: 11 g/dL — ABNORMAL LOW (ref 12.0–15.0)
Lymphocytes Relative: 20.4 % (ref 12.0–46.0)
Lymphs Abs: 1.8 K/uL (ref 0.7–4.0)
MCHC: 33.9 g/dL (ref 30.0–36.0)
MCV: 91.7 fl (ref 78.0–100.0)
Monocytes Absolute: 0.4 K/uL (ref 0.1–1.0)
Monocytes Relative: 4.7 % (ref 3.0–12.0)
Neutro Abs: 6.4 K/uL (ref 1.4–7.7)
Neutrophils Relative %: 73.1 % (ref 43.0–77.0)
Platelets: 190 K/uL (ref 150.0–400.0)
RBC: 3.54 Mil/uL — ABNORMAL LOW (ref 3.87–5.11)
RDW: 13.6 % (ref 11.5–15.5)
WBC: 8.7 K/uL (ref 4.0–10.5)

## 2024-01-29 LAB — TSH: TSH: 0.77 u[IU]/mL (ref 0.35–5.50)

## 2024-01-29 NOTE — Patient Instructions (Signed)
Schedule your complete physical in 6 months We'll notify you of your lab results and make any changes if needed Keep up the good work!  You look great! Call with any questions or concerns Stay Safe!  Stay Healthy! Happy New Year!!!

## 2024-01-29 NOTE — Progress Notes (Signed)
" ° °  Subjective:    Patient ID: Tonya Johns, female    DOB: Aug 11, 1930, 89 y.o.   MRN: [id number]  HPI Hyperlipidemia- chronic problem, on Pravastatin  80mg  daily.  No CP, SOB, abd pain, N/V.  Hypothyroid- chronic problem, on Levothyroxine  112mcg daily.  + fatigue.  No changes to skin/hair/nails.    Overweight- she is down 5 lbs since June.  BMI 29.  Pt reports decreased appetite.   Review of Systems For ROS see HPI     Objective:   Physical Exam Vitals reviewed.  Constitutional:      General: She is not in acute distress.    Appearance: Normal appearance. She is well-developed. She is not ill-appearing.  HENT:     Head: Normocephalic and atraumatic.  Eyes:     Conjunctiva/sclera: Conjunctivae normal.     Pupils: Pupils are equal, round, and reactive to light.  Neck:     Thyroid : No thyromegaly.  Cardiovascular:     Rate and Rhythm: Normal rate and regular rhythm.     Pulses: Normal pulses.     Heart sounds: Normal heart sounds. No murmur heard. Pulmonary:     Effort: Pulmonary effort is normal. No respiratory distress.     Breath sounds: Normal breath sounds.  Abdominal:     General: There is no distension.     Palpations: Abdomen is soft.     Tenderness: There is no abdominal tenderness.  Musculoskeletal:     Cervical back: Normal range of motion and neck supple.     Right lower leg: No edema.     Left lower leg: No edema.  Lymphadenopathy:     Cervical: No cervical adenopathy.  Skin:    General: Skin is warm and dry.  Neurological:     General: No focal deficit present.     Mental Status: She is alert and oriented to person, place, and time.  Psychiatric:        Mood and Affect: Mood normal.        Behavior: Behavior normal.        Thought Content: Thought content normal.           Assessment & Plan:    "

## 2024-01-29 NOTE — Assessment & Plan Note (Signed)
 Down 5 lbs since June.  BMI now 29.  She reports decreased appetite over the last few years but still eating regularly.  Will follow.

## 2024-01-29 NOTE — Assessment & Plan Note (Signed)
Chronic problem.  On Pravastatin 80mg daily w/o difficulty.  Check labs.  Adjust meds prn  

## 2024-01-29 NOTE — Assessment & Plan Note (Signed)
 Chronic problem.  Currently on Levothyroxine  112mcg daily.  + fatigue.  Check labs.  Adjust meds prn

## 2024-01-31 ENCOUNTER — Ambulatory Visit: Payer: Self-pay | Admitting: Family Medicine

## 2024-01-31 NOTE — Progress Notes (Signed)
 Lab results have been discussed.   Verbalized understanding? Yes  Are there any questions? No

## 2024-07-30 ENCOUNTER — Encounter: Admitting: Family Medicine

## 2024-11-19 ENCOUNTER — Ambulatory Visit
